# Patient Record
Sex: Female | Born: 1935 | Race: White | Hispanic: No | Smoking: Never smoker
Health system: Southern US, Community
[De-identification: ages and names within clinical notes are randomized; demographics above are authoritative.]

## PROBLEM LIST (undated history)

## (undated) DIAGNOSIS — R911 Solitary pulmonary nodule: Secondary | ICD-10-CM

## (undated) DIAGNOSIS — D375 Neoplasm of uncertain behavior of rectum: Secondary | ICD-10-CM

## (undated) DIAGNOSIS — K769 Liver disease, unspecified: Secondary | ICD-10-CM

## (undated) DIAGNOSIS — M549 Dorsalgia, unspecified: Secondary | ICD-10-CM

## (undated) DIAGNOSIS — E039 Hypothyroidism, unspecified: Secondary | ICD-10-CM

## (undated) DIAGNOSIS — K621 Rectal polyp: Secondary | ICD-10-CM

## (undated) DIAGNOSIS — N390 Urinary tract infection, site not specified: Secondary | ICD-10-CM

## (undated) DIAGNOSIS — I459 Conduction disorder, unspecified: Secondary | ICD-10-CM

## (undated) DIAGNOSIS — I10 Essential (primary) hypertension: Secondary | ICD-10-CM

## (undated) DIAGNOSIS — I351 Nonrheumatic aortic (valve) insufficiency: Secondary | ICD-10-CM

## (undated) DIAGNOSIS — I442 Atrioventricular block, complete: Secondary | ICD-10-CM

## (undated) DIAGNOSIS — I340 Nonrheumatic mitral (valve) insufficiency: Principal | ICD-10-CM

## (undated) DIAGNOSIS — K5732 Diverticulitis of large intestine without perforation or abscess without bleeding: Secondary | ICD-10-CM

## (undated) DIAGNOSIS — K5792 Diverticulitis of intestine, part unspecified, without perforation or abscess without bleeding: Secondary | ICD-10-CM

## (undated) HISTORY — DX: Liver disease, unspecified: K76.9

## (undated) HISTORY — PX: ABDOMINAL HYSTERECTOMY: SHX81

## (undated) HISTORY — DX: Diverticulitis of intestine, part unspecified, without perforation or abscess without bleeding: K57.92

## (undated) HISTORY — DX: Neoplasm of uncertain behavior of rectum: D37.5

## (undated) HISTORY — DX: Essential (primary) hypertension: I10

## (undated) HISTORY — DX: Rectal polyp: K62.1

## (undated) HISTORY — DX: Diverticulitis of large intestine without perforation or abscess without bleeding: K57.32

## (undated) HISTORY — DX: Atrioventricular block, complete: I44.2

## (undated) HISTORY — DX: Solitary pulmonary nodule: R91.1

## (undated) HISTORY — DX: Nonrheumatic mitral (valve) insufficiency: I34.0

## (undated) HISTORY — DX: Conduction disorder, unspecified: I45.9

## (undated) HISTORY — DX: Nonrheumatic aortic (valve) insufficiency: I35.1

---

## 1969-06-30 HISTORY — PX: INNER EAR SURGERY: SHX679

## 1998-11-05 ENCOUNTER — Other Ambulatory Visit: Admission: RE | Admit: 1998-11-05 | Discharge: 1998-11-05 | Payer: Self-pay | Admitting: Obstetrics and Gynecology

## 2000-01-09 ENCOUNTER — Other Ambulatory Visit: Admission: RE | Admit: 2000-01-09 | Discharge: 2000-01-09 | Payer: Self-pay | Admitting: Obstetrics and Gynecology

## 2000-01-20 ENCOUNTER — Encounter: Admission: RE | Admit: 2000-01-20 | Discharge: 2000-01-20 | Payer: Self-pay | Admitting: Obstetrics and Gynecology

## 2000-01-20 ENCOUNTER — Encounter: Payer: Self-pay | Admitting: Obstetrics and Gynecology

## 2000-12-19 ENCOUNTER — Other Ambulatory Visit: Admission: RE | Admit: 2000-12-19 | Discharge: 2000-12-19 | Payer: Self-pay | Admitting: Obstetrics and Gynecology

## 2001-01-21 ENCOUNTER — Encounter: Payer: Self-pay | Admitting: Obstetrics and Gynecology

## 2001-01-21 ENCOUNTER — Encounter: Admission: RE | Admit: 2001-01-21 | Discharge: 2001-01-21 | Payer: Self-pay | Admitting: Obstetrics and Gynecology

## 2001-10-08 ENCOUNTER — Other Ambulatory Visit: Admission: RE | Admit: 2001-10-08 | Discharge: 2001-10-08 | Payer: Self-pay | Admitting: Obstetrics and Gynecology

## 2002-01-03 ENCOUNTER — Encounter: Admission: RE | Admit: 2002-01-03 | Discharge: 2002-01-03 | Payer: Self-pay | Admitting: Gynecology

## 2002-01-03 ENCOUNTER — Encounter: Payer: Self-pay | Admitting: Gynecology

## 2002-12-29 ENCOUNTER — Encounter: Admission: RE | Admit: 2002-12-29 | Discharge: 2002-12-29 | Payer: Self-pay | Admitting: Gynecology

## 2002-12-29 ENCOUNTER — Encounter: Payer: Self-pay | Admitting: Gynecology

## 2003-09-30 ENCOUNTER — Other Ambulatory Visit: Admission: RE | Admit: 2003-09-30 | Discharge: 2003-09-30 | Payer: Self-pay | Admitting: Gynecology

## 2004-02-03 ENCOUNTER — Encounter: Admission: RE | Admit: 2004-02-03 | Discharge: 2004-02-03 | Payer: Self-pay | Admitting: Gynecology

## 2005-02-03 ENCOUNTER — Encounter: Admission: RE | Admit: 2005-02-03 | Discharge: 2005-02-03 | Payer: Self-pay | Admitting: Gynecology

## 2005-02-22 ENCOUNTER — Encounter: Admission: RE | Admit: 2005-02-22 | Discharge: 2005-02-22 | Payer: Self-pay | Admitting: Gynecology

## 2006-02-01 ENCOUNTER — Other Ambulatory Visit: Admission: RE | Admit: 2006-02-01 | Discharge: 2006-02-01 | Payer: Self-pay | Admitting: Gynecology

## 2006-02-05 ENCOUNTER — Encounter: Admission: RE | Admit: 2006-02-05 | Discharge: 2006-02-05 | Payer: Self-pay | Admitting: Gynecology

## 2007-02-08 ENCOUNTER — Encounter: Admission: RE | Admit: 2007-02-08 | Discharge: 2007-02-08 | Payer: Self-pay | Admitting: Gynecology

## 2008-02-18 ENCOUNTER — Encounter: Admission: RE | Admit: 2008-02-18 | Discharge: 2008-02-18 | Payer: Self-pay | Admitting: Gynecology

## 2009-02-18 ENCOUNTER — Encounter: Admission: RE | Admit: 2009-02-18 | Discharge: 2009-02-18 | Payer: Self-pay | Admitting: Gynecology

## 2010-04-11 ENCOUNTER — Encounter: Admission: RE | Admit: 2010-04-11 | Discharge: 2010-04-11 | Payer: Self-pay | Admitting: Gynecology

## 2010-06-28 ENCOUNTER — Encounter: Admission: RE | Admit: 2010-06-28 | Discharge: 2010-06-28 | Payer: Self-pay | Admitting: Gynecology

## 2010-11-20 ENCOUNTER — Encounter: Payer: Self-pay | Admitting: Gynecology

## 2011-04-07 ENCOUNTER — Other Ambulatory Visit: Payer: Self-pay | Admitting: Gynecology

## 2011-04-07 DIAGNOSIS — Z1231 Encounter for screening mammogram for malignant neoplasm of breast: Secondary | ICD-10-CM

## 2011-04-13 ENCOUNTER — Ambulatory Visit
Admission: RE | Admit: 2011-04-13 | Discharge: 2011-04-13 | Disposition: A | Discharge status: Home / Self Care | Payer: Medicare Other | Source: Ambulatory Visit | Attending: Gynecology | Admitting: Gynecology

## 2011-04-13 DIAGNOSIS — Z1231 Encounter for screening mammogram for malignant neoplasm of breast: Secondary | ICD-10-CM

## 2011-11-15 DIAGNOSIS — R7989 Other specified abnormal findings of blood chemistry: Secondary | ICD-10-CM | POA: Diagnosis not present

## 2012-01-09 DIAGNOSIS — L259 Unspecified contact dermatitis, unspecified cause: Secondary | ICD-10-CM | POA: Diagnosis not present

## 2012-03-21 ENCOUNTER — Other Ambulatory Visit: Payer: Self-pay | Admitting: Gynecology

## 2012-03-21 DIAGNOSIS — Z1231 Encounter for screening mammogram for malignant neoplasm of breast: Secondary | ICD-10-CM

## 2012-04-15 ENCOUNTER — Ambulatory Visit
Admission: RE | Admit: 2012-04-15 | Discharge: 2012-04-15 | Disposition: A | Discharge status: Home / Self Care | Payer: Medicare Other | Source: Ambulatory Visit | Attending: Gynecology | Admitting: Gynecology

## 2012-04-15 DIAGNOSIS — Z1231 Encounter for screening mammogram for malignant neoplasm of breast: Secondary | ICD-10-CM | POA: Diagnosis not present

## 2012-05-30 DIAGNOSIS — H04129 Dry eye syndrome of unspecified lacrimal gland: Secondary | ICD-10-CM | POA: Diagnosis not present

## 2012-05-30 DIAGNOSIS — D3130 Benign neoplasm of unspecified choroid: Secondary | ICD-10-CM | POA: Diagnosis not present

## 2012-05-30 DIAGNOSIS — Z961 Presence of intraocular lens: Secondary | ICD-10-CM | POA: Diagnosis not present

## 2012-07-22 DIAGNOSIS — E039 Hypothyroidism, unspecified: Secondary | ICD-10-CM | POA: Diagnosis not present

## 2012-08-13 DIAGNOSIS — N3090 Cystitis, unspecified without hematuria: Secondary | ICD-10-CM | POA: Diagnosis not present

## 2012-08-13 DIAGNOSIS — Z01419 Encounter for gynecological examination (general) (routine) without abnormal findings: Secondary | ICD-10-CM | POA: Diagnosis not present

## 2012-08-13 DIAGNOSIS — N8184 Pelvic muscle wasting: Secondary | ICD-10-CM | POA: Diagnosis not present

## 2012-08-21 DIAGNOSIS — N390 Urinary tract infection, site not specified: Secondary | ICD-10-CM | POA: Diagnosis not present

## 2012-12-06 DIAGNOSIS — N390 Urinary tract infection, site not specified: Secondary | ICD-10-CM | POA: Diagnosis not present

## 2013-01-15 ENCOUNTER — Inpatient Hospital Stay (HOSPITAL_COMMUNITY)
Admission: EM | Admit: 2013-01-15 | Discharge: 2013-01-17 | DRG: 375 | Disposition: A | Discharge status: Home / Self Care | Payer: Medicare Other | Attending: Family Medicine | Admitting: Family Medicine

## 2013-01-15 ENCOUNTER — Encounter (HOSPITAL_COMMUNITY): Payer: Self-pay | Admitting: Emergency Medicine

## 2013-01-15 DIAGNOSIS — K769 Liver disease, unspecified: Secondary | ICD-10-CM | POA: Diagnosis not present

## 2013-01-15 DIAGNOSIS — Z79899 Other long term (current) drug therapy: Secondary | ICD-10-CM | POA: Diagnosis not present

## 2013-01-15 DIAGNOSIS — D371 Neoplasm of uncertain behavior of stomach: Secondary | ICD-10-CM | POA: Diagnosis not present

## 2013-01-15 DIAGNOSIS — E039 Hypothyroidism, unspecified: Secondary | ICD-10-CM | POA: Diagnosis present

## 2013-01-15 DIAGNOSIS — K6389 Other specified diseases of intestine: Secondary | ICD-10-CM | POA: Diagnosis not present

## 2013-01-15 DIAGNOSIS — C189 Malignant neoplasm of colon, unspecified: Secondary | ICD-10-CM | POA: Diagnosis not present

## 2013-01-15 DIAGNOSIS — K625 Hemorrhage of anus and rectum: Secondary | ICD-10-CM | POA: Diagnosis present

## 2013-01-15 DIAGNOSIS — D375 Neoplasm of uncertain behavior of rectum: Secondary | ICD-10-CM | POA: Diagnosis present

## 2013-01-15 DIAGNOSIS — C787 Secondary malignant neoplasm of liver and intrahepatic bile duct: Secondary | ICD-10-CM | POA: Diagnosis not present

## 2013-01-15 DIAGNOSIS — K7689 Other specified diseases of liver: Secondary | ICD-10-CM | POA: Diagnosis not present

## 2013-01-15 DIAGNOSIS — K5730 Diverticulosis of large intestine without perforation or abscess without bleeding: Secondary | ICD-10-CM | POA: Diagnosis present

## 2013-01-15 DIAGNOSIS — C187 Malignant neoplasm of sigmoid colon: Principal | ICD-10-CM | POA: Diagnosis present

## 2013-01-15 DIAGNOSIS — R160 Hepatomegaly, not elsewhere classified: Secondary | ICD-10-CM | POA: Diagnosis present

## 2013-01-15 HISTORY — DX: Dorsalgia, unspecified: M54.9

## 2013-01-15 HISTORY — DX: Hypothyroidism, unspecified: E03.9

## 2013-01-15 HISTORY — DX: Urinary tract infection, site not specified: N39.0

## 2013-01-15 LAB — OCCULT BLOOD, POC DEVICE: Fecal Occult Bld: POSITIVE — AB

## 2013-01-15 LAB — CBC
MCV: 94.7 fL (ref 78.0–100.0)
Platelets: 426 10*3/uL — ABNORMAL HIGH (ref 150–400)
RDW: 14 % (ref 11.5–15.5)
WBC: 7 10*3/uL (ref 4.0–10.5)

## 2013-01-15 LAB — COMPREHENSIVE METABOLIC PANEL
AST: 21 U/L (ref 0–37)
Albumin: 3.6 g/dL (ref 3.5–5.2)
Calcium: 10.1 mg/dL (ref 8.4–10.5)
Chloride: 103 mEq/L (ref 96–112)
Creatinine, Ser: 0.85 mg/dL (ref 0.50–1.10)
Total Bilirubin: 0.3 mg/dL (ref 0.3–1.2)
Total Protein: 7.4 g/dL (ref 6.0–8.3)

## 2013-01-15 LAB — TYPE AND SCREEN: Antibody Screen: NEGATIVE

## 2013-01-15 LAB — ABO/RH: ABO/RH(D): A POS

## 2013-01-15 NOTE — ED Provider Notes (Signed)
History     CSN: [id number]  Arrival date & time 01/15/13  1907   First MD Initiated Contact with Patient 01/15/13 2308      Chief Complaint  Patient presents with  . Rectal Bleeding    (Consider location/radiation/quality/duration/timing/severity/associated sxs/prior treatment) HPI [name] is a 79 y.o. female presenting with rectal bleeding. She denies any abdominal pain, nausea vomiting, changes in stools says becoming a little bit looser today. She had one episode after breakfast, should one episode before and her today. She admits that she eaten some beets and tomatoes yesterday however she said there is definite blood in the stools. Symptoms of an intermittent, severe, and no other associated symptoms no other exacerbating or alleviating factors.  Patient takes no medications besides thyroid medication, has a relatively unremarkable medical history denies any alcohol, tobacco or illicit drug use. She is a remote history of having hemorrhoids but says she's not having any pain with defecation. No recent history of constipation since she found "a cereal" that keeps her regular.   The patient specifically denies any chest pain, shortness of breath, lightheadedness, dizziness, changes in vision, double vision, blurry vision, headache, rash, new arthralgias, myalgias.    Patient is a new patient of Dr. Christell Constant in [place].  Past Medical History  Diagnosis Date  . Back pain   . UTI (urinary tract infection)   . Hypothyroid     Past Surgical History  Procedure Laterality Date  . Abdominal hysterectomy      No family history on file.  History  Substance Use Topics  . Smoking status: Never Smoker   . Smokeless tobacco: Not on file  . Alcohol Use: No    OB History   Grav Para Term Preterm Abortions TAB SAB Ect Mult Living                  Review of Systems At least 10pt or greater review of systems completed and are negative except where specified in the  HPI.  Allergies  Review of patient's allergies indicates not on file.  Home Medications  No current outpatient prescriptions on file.  BP 142/68  Pulse 90  Temp(Src) 98.6 F (37 C) (Oral)  Resp 16  SpO2 98%  Physical Exam  Nursing notes reviewed.  Electronic medical record reviewed. VITAL SIGNS:   Filed Vitals:   01/15/13 1926 01/15/13 1938 01/15/13 1939  BP: 142/68 136/66 142/68  Pulse: 90 104 90  Temp: 98.6 F (37 C)    TempSrc: Oral    Resp: 16    SpO2: 98%     CONSTITUTIONAL: Awake, oriented, appears non-toxic HENT: Atraumatic, normocephalic, oral mucosa pink and moist, airway patent. Nares patent without drainage. External ears normal. EYES: Conjunctiva clear, EOMI, PERRLA NECK: Trachea midline, non-tender, supple CARDIOVASCULAR: Normal heart rate, Normal rhythm, No murmurs, rubs, gallops PULMONARY/CHEST: Clear to auscultation, no rhonchi, wheezes, or rales. Symmetrical breath sounds. Non-tender. ABDOMINAL: Non-distended, soft, non-tender - no rebound or guarding.  BS normal. RECTAL: normal rectal tone, grossly bloody stool NEUROLOGIC: Non-focal, moving all four extremities, no gross sensory or motor deficits. EXTREMITIES: No clubbing, cyanosis, or edema SKIN: Warm, Dry, No erythema, No rash. Excoriations to the middle of the lower back   ED Course  Procedures (including critical care time)  Labs Reviewed  CBC - Abnormal; Notable for the following:    Platelets 426 (*)    All other components within normal limits  COMPREHENSIVE METABOLIC PANEL - Abnormal; Notable for the following:  Glucose, Bld 107 (*)    GFR calc non Af Amer 62 (*)    GFR calc Af Amer 72 (*)    All other components within normal limits  TYPE AND SCREEN  ABO/RH   Ct Abdomen Pelvis W Contrast  01/16/2013  *RADIOLOGY REPORT*  Clinical Data: 2 episodes of bright red blood per rectum earlier today.  Surgical history includes hysterectomy.  CT ABDOMEN AND PELVIS WITH CONTRAST  Technique:   Multidetector CT imaging of the abdomen and pelvis was performed following the standard protocol during bolus administration of intravenous contrast.  Contrast: 80mL OMNIPAQUE IOHEXOL 300 MG/ML. Oral contrast was also administered.  Comparison: None.  Findings: Respiratory motion blurred images throughout the examination.  Sigmoid colon diverticulosis.  Enhancing mass in the distal sigmoid colon at the rectosigmoid junction which measures approximately 2.7 x 3.2 x 3.5 cm.  Edema/inflammation versus lymphatic extension involving the adjacent mesenteric fat.  No extraluminal gas.  No abscess.  Mildly enlarged lymph nodes in the adjacent mesentery.  Approximate 2.8 x 2.8 x 2.8 cm solid mass in the left lobe of the liver.  No other focal hepatic parenchymal abnormality.  Normal- appearing spleen, pancreas, and adrenal glands.  Cortical cysts involving both kidneys; no significant abnormalities involving either kidney.  Gallbladder unremarkable by CT.  No biliary ductal dilation.  Extensive aorto-iliofemoral atherosclerosis without aneurysm.  Atherosclerotic though patent visceral arteries.  No significant lymphadenopathy.  Small hiatal hernia.  Stomach otherwise unremarkable.  Normal- appearing small bowel.  Normal appendix in the right upper pelvis. No ascites.  Abdominal wall hernia containing fat in the left mid pelvis, involving the lateral aspect of the abdominal wall musculature.  Urinary bladder unremarkable.  Uterus surgically absent.  No adnexal masses or free pelvic fluid.  Numerous pelvic phleboliths.  Bone window images demonstrate degenerative changes involving the lower thoracic and lumbar spine, generalized osseous demineralization, and thoracolumbar scoliosis convex left. Scarring in the visualized lower lobes and a statistically benign pleural-based nodule in the deep left lower lobe measuring approximately 9 mm. Heart mildly enlarged.  IMPRESSION:  1.  Approximate 3.5 cm mass involving the distal  sigmoid colon near the rectosigmoid junction, consistent with malignancy.  There is associated edema/inflammation or local lymphatic extension in the adjacent mesenteric fat, with mildly enlarged local lymph nodes. No extraluminal gas or abscess to suggest perforation. 2.  No significant lymphadenopathy elsewhere in the abdomen or pelvis. 3.  Approximate 2.8 cm metastasis in the left lobe of the liver. No other liver metastases. 4.  Peripheral 9 mm nodule in the deep left lower lobe, statistically benign. 5.  Small hiatal hernia. 6.  Anterior abdominal wall hernia in the mid pelvis involving the lateral abdominal musculature (Spigelian hernia). 7.  Sigmoid colon diverticulosis.   Original Report Authenticated By: Hulan Saas, M.D.       1. Colonic mass   2. Mass of left lobe of liver    MDM  [name] is a 79 y.o. female With no pertinent medical history presents with bright blood PR.  Patient says she is relatively regular and has had no bowel habit changes, this point differential still remains broad, likely bleeding diverticula versus colonic CA, versus internal hemorrhoid that is not seen on physical exam.  Patient had labs obtained per triage including type and screen, CMP and CBC showing hemoglobin to be stable at 13.5 record 39.4, there is no prior CBC to compare with.  We'll obtain CT of the abdomen and pelvis looking  for masses or diverticulosis.  Patient has approximate 3.5 cm mass involving distal sigmoid colon in the rectosigmoid junction consistent with malignancy with possible metastasis to the liver at 2.8 cm in the left lobe of the liver.  I discussed these findings with the patient and suggested that she stay in hospital with her continued rectal bleeding for further workup. Initially patient states that she would like to go home, however in further discussions, she agrees to admission to the hospital for further diagnostic testing.       Jones Skene,  MD 01/16/13 [phone]

## 2013-01-15 NOTE — ED Notes (Signed)
C/o 2 episodes of bright red rectal bleeding since this morning.  Denies pain or any other symptoms.

## 2013-01-16 ENCOUNTER — Emergency Department (HOSPITAL_COMMUNITY): Payer: Medicare Other

## 2013-01-16 DIAGNOSIS — K6389 Other specified diseases of intestine: Secondary | ICD-10-CM

## 2013-01-16 DIAGNOSIS — K625 Hemorrhage of anus and rectum: Secondary | ICD-10-CM | POA: Diagnosis not present

## 2013-01-16 DIAGNOSIS — D375 Neoplasm of uncertain behavior of rectum: Secondary | ICD-10-CM | POA: Diagnosis present

## 2013-01-16 DIAGNOSIS — K769 Liver disease, unspecified: Secondary | ICD-10-CM

## 2013-01-16 DIAGNOSIS — C189 Malignant neoplasm of colon, unspecified: Secondary | ICD-10-CM | POA: Diagnosis not present

## 2013-01-16 DIAGNOSIS — C787 Secondary malignant neoplasm of liver and intrahepatic bile duct: Secondary | ICD-10-CM | POA: Diagnosis not present

## 2013-01-16 DIAGNOSIS — R160 Hepatomegaly, not elsewhere classified: Secondary | ICD-10-CM | POA: Diagnosis present

## 2013-01-16 HISTORY — DX: Neoplasm of uncertain behavior of rectum: D37.5

## 2013-01-16 LAB — PROTIME-INR: Prothrombin Time: 13.4 seconds (ref 11.6–15.2)

## 2013-01-16 LAB — BASIC METABOLIC PANEL
GFR calc Af Amer: 69 mL/min — ABNORMAL LOW (ref >90)
GFR calc non Af Amer: 60 mL/min — ABNORMAL LOW (ref >90)
Glucose, Bld: 104 mg/dL — ABNORMAL HIGH (ref 70–99)
Potassium: 4.1 mEq/L (ref 3.5–5.1)
Sodium: 139 mEq/L (ref 135–145)

## 2013-01-16 LAB — CBC
Hemoglobin: 12.1 g/dL (ref 12.0–15.0)
MCHC: 34.1 g/dL (ref 30.0–36.0)

## 2013-01-16 MED ORDER — PEG-KCL-NACL-NASULF-NA ASC-C 100 G PO SOLR
1.0000 | Freq: Once | ORAL | Status: AC
  Administered 2013-01-16: 100 g via ORAL
  Filled 2013-01-16: qty 1

## 2013-01-16 MED ORDER — PEG 3350-KCL-NA BICARB-NACL 420 G PO SOLR
4000.0000 mL | Freq: Once | ORAL | Status: DC
  Filled 2013-01-16: qty 4000

## 2013-01-16 MED ORDER — LEVOTHYROXINE SODIUM 112 MCG PO TABS
112.0000 ug | ORAL_TABLET | Freq: Every day | ORAL | Status: DC
  Administered 2013-01-16 – 2013-01-17 (×2): 112 ug via ORAL
  Filled 2013-01-16 (×3): qty 1

## 2013-01-16 MED ORDER — PEG-KCL-NACL-NASULF-NA ASC-C 100 G PO SOLR: 1.0000 | Freq: Once | ORAL | Status: DC

## 2013-01-16 MED ORDER — IOHEXOL 300 MG/ML  SOLN
80.0000 mL | Freq: Once | INTRAMUSCULAR | Status: AC | PRN
  Administered 2013-01-16: 80 mL via INTRAVENOUS

## 2013-01-16 MED ORDER — ADULT MULTIVITAMIN W/MINERALS CH
1.0000 | ORAL_TABLET | Freq: Every day | ORAL | Status: DC
  Administered 2013-01-16 – 2013-01-17 (×2): 1 via ORAL
  Filled 2013-01-16 (×2): qty 1

## 2013-01-16 MED ORDER — IOHEXOL 300 MG/ML  SOLN
50.0000 mL | Freq: Once | INTRAMUSCULAR | Status: AC | PRN
  Administered 2013-01-16: 50 mL via ORAL

## 2013-01-16 NOTE — Consult Note (Signed)
Patient seen, examined, and I agree with the above documentation, including the assessment and plan. Colonoscopy tomorrow for what is felt to be rectosigmoid colon cancer with new liver mass concerning for metastatic focus Further recommendations after colonoscopy

## 2013-01-16 NOTE — Progress Notes (Signed)
9:34 AM I agree with HPI/GPe and A/P per Dr. Julian Reil   Patient unhappy about needing to wait for colonoscopy. No new issues-has a small dark stool earlier today Tol liquids.  No n/v/cp     HEENT CHEST CARDIAC ABDOMEN NEURO SKIN/MUSCULAR  Patient Active Problem List  Diagnosis  . Colonic mass  . Mass of left lobe of liver   A/P Called GI to assess patient and determine needs for colonoscopy. I have already started her on Golytely in anticipation of this  Pleas Koch, MD [facility] [phone]

## 2013-01-16 NOTE — Progress Notes (Signed)
NURSING PROGRESS NOTE  [name] [id number] Admission Data: 01/16/2013 5:11 AM Attending Provider: Hillary Bow, DO [id number], DONALD, MD Code Status: full  [name] is a 79 y.o. female patient admitted from ED:  -No acute distress noted.  -No complaints of shortness of breath.  -No complaints of chest pain.   Cardiac Monitoring: Not ordered  Blood pressure 130/71, pulse 78, temperature 97.7 F (36.5 C), temperature source Oral, resp. rate 18, SpO2 96.00%.   IV Fluids:  IV in place, occlusive dsg intact without redness, IV cath forearm RIGHT {Meds; iv fluids:NSL  Allergies:  Review of patient's allergies indicates no known allergies.  Past Medical History:   has a past medical history of Back pain; UTI (urinary tract infection); and Hypothyroid.  Past Surgical History:   has past surgical history that includes Abdominal hysterectomy.  Social History:   reports that she has never smoked. She does not have any smokeless tobacco history on file. She reports that she does not drink alcohol or use illicit drugs.  Skin: no skin issues noted  Patient/Family orientated to room. Information packet given to patient/family. Admission inpatient armband information verified with patient/family to include name and date of birth and placed on patient arm. Side rails up x 2, fall assessment and education completed with patient/family. Patient/family able to verbalize understanding of risk associated with falls and verbalized understanding to call for assistance before getting out of bed. Call light within reach. Patient/family able to voice and demonstrate understanding of unit orientation instructions.   Pt to possibly go for colonoscopy to r/o ca on tomorrow.  Will continue to evaluate and treat per MD orders.

## 2013-01-16 NOTE — Consult Note (Signed)
Referring Provider: Dr. Malka So hospitalist Primary Care Physician:  Rudi Heap, MD Primary Gastroenterologist:  none  Reason for Consultation:  Rectal bleeding and colon mass on CT  HPI: [name] is a 79 y.o. female admitted last evening after 2 episodes of rectal bleeding at home yesterday. Patient states that she has never had any GI issues and has not had any prior colonoscopy. She says she had a bowel movement yesterday morning and noticed right red blood in the commode and mixed in with the bowel movement. She had a second episode later yesterday afternoon which she says was less blood and came onto the emergency room. She says she's been feeling well recently and has no complaints of any abdominal discomfort cramping changes in her bowel habits or melena or hematochezia  She is in good general health with history of hypothyroidism. She is not anticoagulated. She has not had any further episodes of bleeding since admission, and is anxious to proceed with further workup.  Labs on admission showed hemoglobin in the 12 range. CT of the abdomen and pelvis were done on admission with findings as outlined below-is consistent with a sigmoid colon cancer and a large solitary metastasis to the left lobe of the liver.  Past Medical History  Diagnosis Date  . Back pain   . UTI (urinary tract infection)   . Hypothyroid     Past Surgical History  Procedure Laterality Date  . Abdominal hysterectomy      Prior to Admission medications   Medication Sig Start Date End Date Taking? Authorizing Provider  levothyroxine (SYNTHROID, LEVOTHROID) 112 MCG tablet Take 112 mcg by mouth daily.   Yes Historical Provider, MD  Multiple Vitamin (MULTIVITAMIN WITH MINERALS) TABS Take 1 tablet by mouth daily.   Yes Historical Provider, MD  OVER THE COUNTER MEDICATION Take 1 tablet by mouth daily. OTC medication for bone strength   Yes Historical Provider, MD    Current Facility-Administered  Medications  Medication Dose Route Frequency Provider Last Rate Last Dose  . levothyroxine (SYNTHROID, LEVOTHROID) tablet 112 mcg  112 mcg Oral QAC breakfast Hillary Bow, DO   112 mcg at 01/16/13 0840  . multivitamin with minerals tablet 1 tablet  1 tablet Oral Daily Hillary Bow, DO   1 tablet at 01/16/13 1006  . polyethylene glycol-electrolytes (NuLYTELY/GoLYTELY) solution 4,000 mL  4,000 mL Oral Once Rhetta Mura, MD        Allergies as of 01/15/2013  . (No Known Allergies)    No family history on file.  History   Social History  . Marital Status: Married    Spouse Name: N/A    Number of Children: N/A  . Years of Education: N/A   Occupational History  . Not on file.   Social History Main Topics  . Smoking status: Never Smoker   . Smokeless tobacco: Not on file  . Alcohol Use: No  . Drug Use: No  . Sexually Active: Not on file   Other Topics Concern  . Not on file   Social History Narrative  . No narrative on file    Review of Systems: Pertinent positive and negative review of systems were noted in the above HPI section.  All other review of systems was otherwise negative.h DM, thyroid, adrenal function.  Physical Exam: Vital signs in last 24 hours: Temp:  [97.7 F (36.5 C)-98.6 F (37 C)] 97.7 F (36.5 C) (03/20 0440) Pulse Rate:  [78-104] 78 (03/20 0440) Resp:  [16-20]  18 (03/20 0440) BP: (130-165)/(66-86) 130/71 mmHg (03/20 0440) SpO2:  [96 %-98 %] 96 % (03/20 0440) Weight:  [143 lb 15.4 oz (65.3 kg)] 143 lb 15.4 oz (65.3 kg) (03/20 0500) Last BM Date: 01/15/13 General:   Alert,  Well-developed, well-nourished, elderly white female ,cooperative in NAD, thin Head:  Normocephalic and atraumatic. Eyes:  Sclera clear, no icterus.   Conjunctiva pink. Ears:  Normal auditory acuity. Nose:  No deformity, discharge,  or lesions. Mouth:  No deformity or lesions.   Neck:  Supple; no masses or thyromegaly. Lungs:  Clear throughout to auscultation.    No wheezes, crackles, or rhonchi. Heart:  Regular rate and rhythm; no murmurs, clicks, rubs,  or gallops. Abdomen:  Soft,nontender, BS active,nonpalp mass or hsm.   Rectal:  Deferred  Msk:  Symmetrical without gross deformities. . Pulses:  Normal pulses noted. Extremities:  Without clubbing or edema. Neurologic:  Alert and  oriented x4;  grossly normal neurologically. Skin:  Intact without significant lesions or rashes.. Psych:  Alert and cooperative. Normal mood and affect.  Intake/Output from previous day:   Intake/Output this shift: Total I/O In: 240 [P.O.:240] Out: -   Lab Results:  Recent Labs  01/15/13 1936 01/16/13 0615  WBC 7.0 5.5  HGB 13.5 12.1  HCT 39.5 35.5*  PLT 426* 385   BMET  Recent Labs  01/15/13 1936 01/16/13 0615  NA 142 139  K 3.9 4.1  CL 103 103  CO2 26 28  GLUCOSE 107* 104*  BUN 11 10  CREATININE 0.85 0.88  CALCIUM 10.1 9.5   LFT  Recent Labs  01/15/13 1936  PROT 7.4  ALBUMIN 3.6  AST 21  ALT 19  ALKPHOS 77  BILITOT 0.3   PT/INR  Recent Labs  01/16/13 0221  LABPROT 13.4  INR 1.03   Hepatitis Panel No results found for this basename: HEPBSAG, HCVAB, HEPAIGM, HEPBIGM,  in the last 72 hours    Studies/Results: Ct Abdomen Pelvis W Contrast  01/16/2013  *RADIOLOGY REPORT*  Clinical Data: 2 episodes of bright red blood per rectum earlier today.  Surgical history includes hysterectomy.  CT ABDOMEN AND PELVIS WITH CONTRAST  Technique:  Multidetector CT imaging of the abdomen and pelvis was performed following the standard protocol during bolus administration of intravenous contrast.  Contrast: 80mL OMNIPAQUE IOHEXOL 300 MG/ML. Oral contrast was also administered.  Comparison: None.  Findings: Respiratory motion blurred images throughout the examination.  Sigmoid colon diverticulosis.  Enhancing mass in the distal sigmoid colon at the rectosigmoid junction which measures approximately 2.7 x 3.2 x 3.5 cm.  Edema/inflammation versus  lymphatic extension involving the adjacent mesenteric fat.  No extraluminal gas.  No abscess.  Mildly enlarged lymph nodes in the adjacent mesentery.  Approximate 2.8 x 2.8 x 2.8 cm solid mass in the left lobe of the liver.  No other focal hepatic parenchymal abnormality.  Normal- appearing spleen, pancreas, and adrenal glands.  Cortical cysts involving both kidneys; no significant abnormalities involving either kidney.  Gallbladder unremarkable by CT.  No biliary ductal dilation.  Extensive aorto-iliofemoral atherosclerosis without aneurysm.  Atherosclerotic though patent visceral arteries.  No significant lymphadenopathy.  Small hiatal hernia.  Stomach otherwise unremarkable.  Normal- appearing small bowel.  Normal appendix in the right upper pelvis. No ascites.  Abdominal wall hernia containing fat in the left mid pelvis, involving the lateral aspect of the abdominal wall musculature.  Urinary bladder unremarkable.  Uterus surgically absent.  No adnexal masses or free pelvic fluid.  Numerous pelvic phleboliths.  Bone window images demonstrate degenerative changes involving the lower thoracic and lumbar spine, generalized osseous demineralization, and thoracolumbar scoliosis convex left. Scarring in the visualized lower lobes and a statistically benign pleural-based nodule in the deep left lower lobe measuring approximately 9 mm. Heart mildly enlarged.  IMPRESSION:  1.  Approximate 3.5 cm mass involving the distal sigmoid colon near the rectosigmoid junction, consistent with malignancy.  There is associated edema/inflammation or local lymphatic extension in the adjacent mesenteric fat, with mildly enlarged local lymph nodes. No extraluminal gas or abscess to suggest perforation. 2.  No significant lymphadenopathy elsewhere in the abdomen or pelvis. 3.  Approximate 2.8 cm metastasis in the left lobe of the liver. No other liver metastases. 4.  Peripheral 9 mm nodule in the deep left lower lobe, statistically  benign. 5.  Small hiatal hernia. 6.  Anterior abdominal wall hernia in the mid pelvis involving the lateral abdominal musculature (Spigelian hernia). 7.  Sigmoid colon diverticulosis.   Original Report Authenticated By: Hulan Saas, M.D.     IMPRESSION:  #32  79 year old white female with new onset of rectal bleeding and finding of a sigmoid colon mass on CT scan consistent with a colon cancer. As edema extending into the mesenteric fat and mildly enlarged lymph nodes as well as a probable solitary hepatic metastasis in the left lobe of the liver measuring 2.8 x 2.8 cm #2 hypothyroidism  PLAN: #1 findings discussed with the patient and her son. We will schedule for colonoscopy with Dr. Rhea Belton tomorrow 01/17/2013. She will stay on a clear liquid diet today, and complete a  bowel prep #2 serial hemoglobins #3 CEA #4 she will need a surgical consultation this admission and this was discussed as well.   Ardean Melroy  01/16/2013, 12:14 PM

## 2013-01-16 NOTE — Progress Notes (Signed)
INITIAL NUTRITION ASSESSMENT  DOCUMENTATION CODES Per approved criteria  -Not Applicable   INTERVENTION: 1. RD will continue to follow. Pt expects good appetite once diet is advanced. RD will monitor and if po intake is inadequate will discuss nutrition supplements  NUTRITION DIAGNOSIS: Increased nutrition needs related to colon cancer as evidenced by weight loss with normal intake.   Goal: PO intake to meet >/=90% estimated nutrition needs  Monitor:  PO intake, weight trends, plan of cares.   Reason for Assessment: Malnutrition Screening Tool  79 y.o. female  Admitting Dx: Colonic mass  ASSESSMENT: Pt came to ED for BRBPR. Work up reveled colon mass likely malignant, and mass on liver, likely mets.  RD pulled to pt for reported unintentional weight loss and poor appetite PTA.    Pt states that she was eating her usual amount PTA, no specific details. Just that she eats when she wants, and what she wants. Has lost about 10 lbs, unsure of the time frame. Usually weighs about 155 lbs, now 143 lbs, 12 lb weight loss.  Expect that pt has increased energy needs related to colon mass.  Height: Ht Readings from Last 1 Encounters:  01/16/13 5\' 5"  (1.651 m)    Weight: Wt Readings from Last 1 Encounters:  01/16/13 143 lb 15.4 oz (65.3 kg)    Ideal Body Weight: 125 lbs   % Ideal Body Weight: 114%  Wt Readings from Last 10 Encounters:  01/16/13 143 lb 15.4 oz (65.3 kg)    Usual Body Weight: 155 lbs   % Usual Body Weight: 92%  BMI:  Body mass index is 23.96 kg/(m^2). WNL   Estimated Nutritional Needs: Kcal: 1800-2000 Protein: 70-80 gm  Fluid: 1.8-2 L   Skin: intact   Diet Order: Clear Liquid  EDUCATION NEEDS: -No education needs identified at this time  No intake or output data in the 24 hours ending 01/16/13 1008  Last BM: PTA    Labs:   Recent Labs Lab 01/15/13 1936 01/16/13 0615  NA 142 139  K 3.9 4.1  CL 103 103  CO2 26 28  BUN 11 10   CREATININE 0.85 0.88  CALCIUM 10.1 9.5  GLUCOSE 107* 104*    CBG (last 3)  No results found for this basename: GLUCAP,  in the last 72 hours  Scheduled Meds: . levothyroxine  112 mcg Oral QAC breakfast  . multivitamin with minerals  1 tablet Oral Daily    Continuous Infusions:   Past Medical History  Diagnosis Date  . Back pain   . UTI (urinary tract infection)   . Hypothyroid     Past Surgical History  Procedure Laterality Date  . Abdominal hysterectomy      Clarene Duke RD, LDN Pager [phone] After Hours pager [phone]

## 2013-01-16 NOTE — H&P (Signed)
[facility] History and Physical  NYISHA CLIPPARD BJY:[id number] DOB: [birth year] DOA: 01/15/2013  Referring physician: ED PCP: Rudi Heap, MD  Specialists: None  Chief Complaint: BRBPR  HPI: [name] is a 79 y.o. female who presents with c/o BRBPR onset am of 3/19 during a BM.  No associated abd pain, nor n/v.  2 episodes.  Definite blood in the stool she says although not a large amount.  Does have a history of hemorrhoids but no pain with defecation.  In the ED work up revealed a 3.5cm colon mass, likely malignant, and a 2.8cm liver mass likely representing metastatic disease (no other mets identified on CT).  Patient is being admitted for workup, GI consultation, and likely diagnostic colonoscopy and biopsy.  Review of Systems: 12 systems reviewed and otherwise negative.  Past Medical History  Diagnosis Date  . Back pain   . UTI (urinary tract infection)   . Hypothyroid    Past Surgical History  Procedure Laterality Date  . Abdominal hysterectomy     Social History:  reports that she has never smoked. She does not have any smokeless tobacco history on file. She reports that she does not drink alcohol or use illicit drugs.   No Known Allergies  No family history on file. Not contributory to present illness.  Prior to Admission medications   Medication Sig Start Date End Date Taking? Authorizing Provider  levothyroxine (SYNTHROID, LEVOTHROID) 112 MCG tablet Take 112 mcg by mouth daily.   Yes Historical Provider, MD  Multiple Vitamin (MULTIVITAMIN WITH MINERALS) TABS Take 1 tablet by mouth daily.   Yes Historical Provider, MD  OVER THE COUNTER MEDICATION Take 1 tablet by mouth daily. OTC medication for bone strength   Yes Historical Provider, MD   Physical Exam: Filed Vitals:   01/15/13 1926 01/15/13 1938 01/15/13 1939 01/16/13 0248  BP: 142/68 136/66 142/68 165/86  Pulse: 90 104 90 95  Temp: 98.6 F (37 C)   98 F (36.7 C)  TempSrc: Oral     Resp: 16    20  SpO2: 98%   98%    General:  NAD, resting comfortably in bed Eyes: PEERLA EOMI ENT: mucous membranes moist Neck: supple w/o JVD Cardiovascular: RRR w/o MRG Respiratory: CTA B Abdomen: soft, nt, nd, bs+ Skin: no rash nor lesion Musculoskeletal: MAE, full ROM all 4 extremities Psychiatric: normal tone and affect Neurologic: AAOx3, grossly non-focal  Labs on Admission:  Basic Metabolic Panel:  Recent Labs Lab 01/15/13 1936  NA 142  K 3.9  CL 103  CO2 26  GLUCOSE 107*  BUN 11  CREATININE 0.85  CALCIUM 10.1   Liver Function Tests:  Recent Labs Lab 01/15/13 1936  AST 21  ALT 19  ALKPHOS 77  BILITOT 0.3  PROT 7.4  ALBUMIN 3.6   No results found for this basename: LIPASE, AMYLASE,  in the last 168 hours No results found for this basename: AMMONIA,  in the last 168 hours CBC:  Recent Labs Lab 01/15/13 1936  WBC 7.0  HGB 13.5  HCT 39.5  MCV 94.7  PLT 426*   Cardiac Enzymes: No results found for this basename: CKTOTAL, CKMB, CKMBINDEX, TROPONINI,  in the last 168 hours  BNP (last 3 results) No results found for this basename: PROBNP,  in the last 8760 hours CBG: No results found for this basename: GLUCAP,  in the last 168 hours  Radiological Exams on Admission: Ct Abdomen Pelvis W Contrast  01/16/2013  *RADIOLOGY REPORT*  Clinical Data: 2 episodes of bright red blood per rectum earlier today.  Surgical history includes hysterectomy.  CT ABDOMEN AND PELVIS WITH CONTRAST  Technique:  Multidetector CT imaging of the abdomen and pelvis was performed following the standard protocol during bolus administration of intravenous contrast.  Contrast: 80mL OMNIPAQUE IOHEXOL 300 MG/ML. Oral contrast was also administered.  Comparison: None.  Findings: Respiratory motion blurred images throughout the examination.  Sigmoid colon diverticulosis.  Enhancing mass in the distal sigmoid colon at the rectosigmoid junction which measures approximately 2.7 x 3.2 x 3.5 cm.   Edema/inflammation versus lymphatic extension involving the adjacent mesenteric fat.  No extraluminal gas.  No abscess.  Mildly enlarged lymph nodes in the adjacent mesentery.  Approximate 2.8 x 2.8 x 2.8 cm solid mass in the left lobe of the liver.  No other focal hepatic parenchymal abnormality.  Normal- appearing spleen, pancreas, and adrenal glands.  Cortical cysts involving both kidneys; no significant abnormalities involving either kidney.  Gallbladder unremarkable by CT.  No biliary ductal dilation.  Extensive aorto-iliofemoral atherosclerosis without aneurysm.  Atherosclerotic though patent visceral arteries.  No significant lymphadenopathy.  Small hiatal hernia.  Stomach otherwise unremarkable.  Normal- appearing small bowel.  Normal appendix in the right upper pelvis. No ascites.  Abdominal wall hernia containing fat in the left mid pelvis, involving the lateral aspect of the abdominal wall musculature.  Urinary bladder unremarkable.  Uterus surgically absent.  No adnexal masses or free pelvic fluid.  Numerous pelvic phleboliths.  Bone window images demonstrate degenerative changes involving the lower thoracic and lumbar spine, generalized osseous demineralization, and thoracolumbar scoliosis convex left. Scarring in the visualized lower lobes and a statistically benign pleural-based nodule in the deep left lower lobe measuring approximately 9 mm. Heart mildly enlarged.  IMPRESSION:  1.  Approximate 3.5 cm mass involving the distal sigmoid colon near the rectosigmoid junction, consistent with malignancy.  There is associated edema/inflammation or local lymphatic extension in the adjacent mesenteric fat, with mildly enlarged local lymph nodes. No extraluminal gas or abscess to suggest perforation. 2.  No significant lymphadenopathy elsewhere in the abdomen or pelvis. 3.  Approximate 2.8 cm metastasis in the left lobe of the liver. No other liver metastases. 4.  Peripheral 9 mm nodule in the deep left lower  lobe, statistically benign. 5.  Small hiatal hernia. 6.  Anterior abdominal wall hernia in the mid pelvis involving the lateral abdominal musculature (Spigelian hernia). 7.  Sigmoid colon diverticulosis.   Original Report Authenticated By: Hulan Saas, M.D.     EKG: Independently reviewed.  Assessment/Plan Principal Problem:   Colonic mass Active Problems:   Mass of left lobe of liver   1. Colon mass and mass of left lobe of liver - probably representing colon cancer with metastasis to the liver, only the single liver met has been identified at this point and patient certainly does not have a large number of other chronic medical conditions so offering aggressive therapy as an option to the patient is quite reasonable (although patient is still undecided about this).  Admitting patient to hospital for GI consult, likely needs diagnostic colonoscopy with biopsy.  If indeed our suspicions are confirmed then will need oncology consult and likely need surgical consult (if patient willing) for resection of primary tumor and possibly ablation or other management of the metastatic tumor. 2. Hypothyroidism - continue synthroid    Code Status: Full Code (must indicate code status--if unknown or must be presumed, indicate so) Family Communication: Spoke with family  at bedside (indicate person spoken with, if applicable, with phone number if by telephone) Disposition Plan: Admit to inpatient (indicate anticipated LOS)  Time spent: 70 min  Sohaib Vereen M. [facility] Pager [phone]  If 7PM-7AM, please contact night-coverage [email or web address] Password [facility] 01/16/2013, 3:59 AM

## 2013-01-17 ENCOUNTER — Encounter (HOSPITAL_COMMUNITY): Payer: Self-pay

## 2013-01-17 ENCOUNTER — Encounter (HOSPITAL_COMMUNITY)
Admission: EM | Disposition: A | Discharge status: Home / Self Care | Payer: Self-pay | Source: Home / Self Care | Attending: Family Medicine

## 2013-01-17 DIAGNOSIS — K625 Hemorrhage of anus and rectum: Secondary | ICD-10-CM | POA: Diagnosis not present

## 2013-01-17 DIAGNOSIS — K769 Liver disease, unspecified: Secondary | ICD-10-CM | POA: Diagnosis not present

## 2013-01-17 DIAGNOSIS — K6389 Other specified diseases of intestine: Secondary | ICD-10-CM | POA: Diagnosis not present

## 2013-01-17 DIAGNOSIS — D371 Neoplasm of uncertain behavior of stomach: Secondary | ICD-10-CM | POA: Diagnosis not present

## 2013-01-17 HISTORY — PX: FLEXIBLE SIGMOIDOSCOPY: SHX5431

## 2013-01-17 LAB — CBC
Hemoglobin: 13.1 g/dL (ref 12.0–15.0)
MCH: 31.7 pg (ref 26.0–34.0)
RBC: 4.13 MIL/uL (ref 3.87–5.11)
WBC: 8.3 10*3/uL (ref 4.0–10.5)

## 2013-01-17 SURGERY — SIGMOIDOSCOPY, FLEXIBLE
Anesthesia: Moderate Sedation

## 2013-01-17 MED ORDER — MIDAZOLAM HCL 5 MG/ML IJ SOLN
INTRAMUSCULAR | Status: AC
  Filled 2013-01-17: qty 2

## 2013-01-17 MED ORDER — MIDAZOLAM HCL 5 MG/5ML IJ SOLN
INTRAMUSCULAR | Status: DC | PRN
  Administered 2013-01-17 (×2): 2 mg via INTRAVENOUS
  Administered 2013-01-17: 1 mg via INTRAVENOUS

## 2013-01-17 MED ORDER — SODIUM CHLORIDE 0.9 % IV SOLN
INTRAVENOUS | Status: DC
  Administered 2013-01-17: 500 mL via INTRAVENOUS

## 2013-01-17 MED ORDER — FENTANYL CITRATE 0.05 MG/ML IJ SOLN
INTRAMUSCULAR | Status: AC
  Filled 2013-01-17: qty 4

## 2013-01-17 MED ORDER — FENTANYL CITRATE 0.05 MG/ML IJ SOLN
INTRAMUSCULAR | Status: DC | PRN
  Administered 2013-01-17 (×2): 25 ug via INTRAVENOUS

## 2013-01-17 MED ORDER — IRON 325 (65 FE) MG PO TABS: 2.0000 | ORAL_TABLET | Freq: Every day | ORAL | Status: DC

## 2013-01-17 NOTE — Discharge Summary (Addendum)
Physician Discharge Summary  [name] NFA:[id number] DOB: June 08, 1928 DOA: 01/15/2013  PCP: Rudi Heap, MD  Admit date: 01/15/2013 Discharge date: 01/17/2013  Time spent: 25 minutes  Recommendations for Outpatient Follow-up:  1. Follow up with Dr. Rhea Belton for further consideration of therapies once Biospy results return  2. Get cbc in about 1 week at PCP office   Discharge Diagnoses:  Principal Problem:   Colonic mass Active Problems:   Mass of left lobe of liver   Discharge Condition: fair   Diet recommendation: reg  Filed Weights   01/16/13 0500  Weight: 65.3 kg (143 lb 15.4 oz)    History of present illness:  79 year old Caucasian female admitted 01/16/2013 with bright red blood per. Has not had this before. CT scan revealed a 3.5 cm colon mass and 2.8 cm liver mass. Gastroenterology was consulted and patient was prepared for colonoscopy and then subsequently underwent sigmoidoscopy on 01/17/2013. The initial findings on image review were suspicious for large polyp/mass with confirmation of this probably being cancer. Last 3 results are pending and as patient is hemodynamically stable tolerating by mouth well and has no acute issues, patient has been discharged back to her primary care physician and will followup with Dr. Rhea Belton for further workup inclusive of potentially adjuvant chemotherapy and then resection  Procedures: Flex sigmoidoscopy 3.21.2014-Half-circumferential mass, measuring 25 X 25mm in size, were found in the sigmoid colon; multiple biopsies of the lesion were performed 2. Near circumferential mass were found in the rectum; multiple biopsies of the lesion were performed 3. Moderate diverticulosis was noted in the descending colon andsigmoid colon   Consultations:  GI-Dr. Margretta Sidle  Discharge Exam: Filed Vitals:   01/17/13 1500 01/17/13 1505 01/17/13 1510 01/17/13 1515  BP: 141/56 151/70 160/73 155/65  Pulse:      Temp:      TempSrc:      Resp:  18 13 33 50  Height:      Weight:      SpO2: 99% 99% 100% 100%   Patient seen on Endo suite No further stools no bleeding no nausea no vomiting no chest  General: Alert pleasant oriented Cardiovascular: Some S2 no murmur rub or gallop Respiratory: Clinically clear Soft nontender no rebound  Discharge Instructions  Follow-up Information   Follow up with Rudi Heap, MD.   Contact information:   [address] [place] [address] [phone]       Follow up with PYRTLE, Carie Caddy, MD. (their office will call u)    Contact information:   [address] [place] [address] [phone]        Discharge Orders   Future Orders Complete By Expires     Call MD for:  extreme fatigue  As directed     Call MD for:  hives  As directed     Call MD for:  persistant nausea and vomiting  As directed     Call MD for:  redness, tenderness, or signs of infection (pain, swelling, redness, odor or green/yellow discharge around incision site)  As directed     Diet - low sodium heart healthy  As directed     Increase activity slowly  As directed         Medication List    STOP taking these medications       OVER THE COUNTER MEDICATION      TAKE these medications       Iron 325 (65 FE) MG Tabs  Take 2 tablets by mouth daily.     levothyroxine 112 MCG tablet  Commonly known as:  SYNTHROID, LEVOTHROID  Take 112 mcg by mouth daily.     multivitamin with minerals Tabs  Take 1 tablet by mouth daily.          The results of significant diagnostics from this hospitalization (including imaging, microbiology, ancillary and laboratory) are listed below for reference.    Significant Diagnostic Studies: Ct Abdomen Pelvis W Contrast  01/16/2013  *RADIOLOGY REPORT*  Clinical Data: 2 episodes of bright red blood per rectum earlier today.  Surgical history includes hysterectomy.  CT ABDOMEN AND PELVIS WITH CONTRAST  Technique:  Multidetector CT imaging of the abdomen and pelvis  was performed following the standard protocol during bolus administration of intravenous contrast.  Contrast: 80mL OMNIPAQUE IOHEXOL 300 MG/ML. Oral contrast was also administered.  Comparison: None.  Findings: Respiratory motion blurred images throughout the examination.  Sigmoid colon diverticulosis.  Enhancing mass in the distal sigmoid colon at the rectosigmoid junction which measures approximately 2.7 x 3.2 x 3.5 cm.  Edema/inflammation versus lymphatic extension involving the adjacent mesenteric fat.  No extraluminal gas.  No abscess.  Mildly enlarged lymph nodes in the adjacent mesentery.  Approximate 2.8 x 2.8 x 2.8 cm solid mass in the left lobe of the liver.  No other focal hepatic parenchymal abnormality.  Normal- appearing spleen, pancreas, and adrenal glands.  Cortical cysts involving both kidneys; no significant abnormalities involving either kidney.  Gallbladder unremarkable by CT.  No biliary ductal dilation.  Extensive aorto-iliofemoral atherosclerosis without aneurysm.  Atherosclerotic though patent visceral arteries.  No significant lymphadenopathy.  Small hiatal hernia.  Stomach otherwise unremarkable.  Normal- appearing small bowel.  Normal appendix in the right upper pelvis. No ascites.  Abdominal wall hernia containing fat in the left mid pelvis, involving the lateral aspect of the abdominal wall musculature.  Urinary bladder unremarkable.  Uterus surgically absent.  No adnexal masses or free pelvic fluid.  Numerous pelvic phleboliths.  Bone window images demonstrate degenerative changes involving the lower thoracic and lumbar spine, generalized osseous demineralization, and thoracolumbar scoliosis convex left. Scarring in the visualized lower lobes and a statistically benign pleural-based nodule in the deep left lower lobe measuring approximately 9 mm. Heart mildly enlarged.  IMPRESSION:  1.  Approximate 3.5 cm mass involving the distal sigmoid colon near the rectosigmoid junction, consistent  with malignancy.  There is associated edema/inflammation or local lymphatic extension in the adjacent mesenteric fat, with mildly enlarged local lymph nodes. No extraluminal gas or abscess to suggest perforation. 2.  No significant lymphadenopathy elsewhere in the abdomen or pelvis. 3.  Approximate 2.8 cm metastasis in the left lobe of the liver. No other liver metastases. 4.  Peripheral 9 mm nodule in the deep left lower lobe, statistically benign. 5.  Small hiatal hernia. 6.  Anterior abdominal wall hernia in the mid pelvis involving the lateral abdominal musculature (Spigelian hernia). 7.  Sigmoid colon diverticulosis.   Original Report Authenticated By: Hulan Saas, M.D.     Microbiology: No results found for this or any previous visit (from the past 240 hour(s)).   Labs: Basic Metabolic Panel:  Recent Labs Lab 01/15/13 1936 01/16/13 0615  NA 142 139  K 3.9 4.1  CL 103 103  CO2 26 28  GLUCOSE 107* 104*  BUN 11 10  CREATININE 0.85 0.88  CALCIUM 10.1 9.5   Liver Function Tests:  Recent Labs Lab 01/15/13 1936  AST 21  ALT  19  ALKPHOS 77  BILITOT 0.3  PROT 7.4  ALBUMIN 3.6   No results found for this basename: LIPASE, AMYLASE,  in the last 168 hours No results found for this basename: AMMONIA,  in the last 168 hours CBC:  Recent Labs Lab 01/15/13 1936 01/16/13 0615  WBC 7.0 5.5  HGB 13.5 12.1  HCT 39.5 35.5*  MCV 94.7 91.7  PLT 426* 385   Cardiac Enzymes: No results found for this basename: CKTOTAL, CKMB, CKMBINDEX, TROPONINI,  in the last 168 hours BNP: BNP (last 3 results) No results found for this basename: PROBNP,  in the last 8760 hours CBG: No results found for this basename: GLUCAP,  in the last 168 hours     Signed:  Rhetta Mura  [facility] 01/17/2013, 3:44 PM

## 2013-01-17 NOTE — Progress Notes (Signed)
Patient ID: [name], female   DOB: [birth year], 79 y.o.   MRN: [id number] [facility] Progress Note  Subjective: No problems with prep- no obvious bleeding. Pt relates that she will probably want to go home prior to going to surgery...  Objective:  Vital signs in last 24 hours: Temp:  [97.7 F (36.5 C)-98 F (36.7 C)] 97.7 F (36.5 C) (03/21 0535) Pulse Rate:  [72-84] 72 (03/21 0535) Resp:  [16-20] 16 (03/21 0535) BP: (124-133)/(52-73) 133/73 mmHg (03/21 0535) SpO2:  [98 %-99 %] 99 % (03/21 0535) Last BM Date: 01/16/13 General:   Alert,  Well-developed,thin elderly WF    in NAD Heart:  Regular rate and rhythm; no murmurs Pulm;clear Abdomen:  Soft, nontender and nondistended. Normal bowel sounds, without guarding, and without rebound.   Extremities:  Without edema. Neurologic:  Alert and  oriented x4;  grossly normal neurologically. Psych:  Alert and cooperative. Normal mood and affect.  Intake/Output from previous day: 03/20 0701 - 03/21 0700 In: 1600 [P.O.:1600] Out: -  Intake/Output this shift:    Lab Results:  Recent Labs  01/15/13 1936 01/16/13 0615  WBC 7.0 5.5  HGB 13.5 12.1  HCT 39.5 35.5*  PLT 426* 385   BMET  Recent Labs  01/15/13 1936 01/16/13 0615  NA 142 139  K 3.9 4.1  CL 103 103  CO2 26 28  GLUCOSE 107* 104*  BUN 11 10  CREATININE 0.85 0.88  CALCIUM 10.1 9.5   LFT  Recent Labs  01/15/13 1936  PROT 7.4  ALBUMIN 3.6  AST 21  ALT 19  ALKPHOS 77  BILITOT 0.3   PT/INR  Recent Labs  01/16/13 0221  LABPROT 13.4  INR 1.03      Assessment / Plan: #1  79  Yo female   With colonic mass c/w colon cancer with solitary liver lesion.  For colonoscopy this am. Await findings Fortunately no further bleeding Principal Problem:   Colonic mass Active Problems:   Mass of left lobe of liver     LOS: 2 days   [name]  01/17/2013, 9:21 AM

## 2013-01-17 NOTE — Progress Notes (Signed)
Patient seen, examined, and I agree with the above documentation, including the assessment and plan. Further recs after colonoscopy

## 2013-01-17 NOTE — Care Management Note (Unsigned)
    Page 1 of 1   01/17/2013     5:29:41 PM   CARE MANAGEMENT NOTE 01/17/2013  Patient:  [name], [name]   Account Number:  [id number]  Date Initiated:  01/17/2013  Documentation initiated by:  Letha Cape  Subjective/Objective Assessment:   dx colonic mass  admit- lives with children.     Action/Plan:   Anticipated DC Date:  01/17/2013   Anticipated DC Plan:  HOME/SELF CARE      DC Planning Services  CM consult      Choice offered to / List presented to:             Status of service:  Completed, signed off Medicare Important Message given?   (If response is "NO", the following Medicare IM given date fields will be blank) Date Medicare IM given:   Date Additional Medicare IM given:    Discharge Disposition:  HOME/SELF CARE  Per UR Regulation:  Reviewed for med. necessity/level of care/duration of stay  If discussed at Long Length of Stay Meetings, dates discussed:    Comments:  01/17/13 17:27 Letha Cape RN, BSN [phone] patient lives with family members.  NCM will continue to follow for de needs.  There is order for dc home today, no referral for  M.

## 2013-01-17 NOTE — Progress Notes (Signed)
Patients second liter of moviprep was given per Moviprep order at 0500. Patient instructed to drink complete container by 630 AM and then she will be NPO. Will continue to monitor.   Marcelyn Bruins RN BSN

## 2013-01-17 NOTE — Op Note (Signed)
[facility] [address] [place], [address]   COLONOSCOPY PROCEDURE REPORT  PATIENT: [name], [name].  MR#: [id number] BIRTHDATE: 1928-10-21 , 84  yrs. old GENDER: Female ENDOSCOPIST: Beverley Fiedler, MD REFERRED XB:[id number], [facility] PROCEDURE DATE:  01/17/2013 PROCEDURE:   Flexible sigmoidoscopy with cold biopsy ASA CLASS:   Class III INDICATIONS:Rectal Bleeding and an abnormal CT. MEDICATIONS: These medications were titrated to patient response per physician's verbal order, Fentanyl 50 mcg IV, and Versed 5 mg IV  DESCRIPTION OF PROCEDURE:   After the risks benefits and alternatives of the procedure were thoroughly explained, informed consent was obtained.  A digital rectal exam revealed a skin tag. The Pentax Ultra Slim [id number]  endoscope was introduced through the anus and advanced to the descending colon. No adverse events experienced.   The quality of the prep was good, using MoviPrep The instrument was then slowly withdrawn as the colon was fully examined.   COLON FINDINGS: A half circumferential firm and somewhat smooth mass, measuring 25 X 25mm in size, was found in the sigmoid colon at 30 cm from the dentate line.  Multiple biopsies of the lesion were performed using cold forceps.   A near circumferential mass was found in the rectum beginning just 1-2 cm from the dentate line and spreading laterally to involve a large majority of the rectal wall.  It extends past the first rectal valve.  Multiple biopsies of the lesion were performed.   Moderate diverticulosis was noted in the descending colon and sigmoid colon.  Retroflexed views revealed findings as above.      The scope was withdrawn and the procedure completed. COMPLICATIONS: There were no complications.  ENDOSCOPIC IMPRESSION: 1.   Half-circumferential mass, measuring 25 X 25mm in size, were found in the sigmoid colon; multiple biopsies of the lesion were performed 2.   Near  circumferential mass were found in the rectum; multiple biopsies of the lesion were performed 3.   Moderate diverticulosis was noted in the descending colon and sigmoid colon 4.   Full colonoscopy was attempted, but due to patient discomfort the procedure was terminated after reaching the distal descending colon  RECOMMENDATIONS: 1.  Await pathology results 2.  Anticipate surgical and oncology consultation (could occur as an outpatient)   eSigned:  Beverley Fiedler, MD 01/17/2013 3:32 PM cc: The Patient

## 2013-01-20 ENCOUNTER — Encounter (HOSPITAL_COMMUNITY): Payer: Self-pay | Admitting: Internal Medicine

## 2013-01-22 ENCOUNTER — Other Ambulatory Visit: Payer: Self-pay | Admitting: Internal Medicine

## 2013-01-22 DIAGNOSIS — K769 Liver disease, unspecified: Secondary | ICD-10-CM

## 2013-01-23 ENCOUNTER — Other Ambulatory Visit: Payer: Self-pay | Admitting: Internal Medicine

## 2013-01-23 DIAGNOSIS — K769 Liver disease, unspecified: Secondary | ICD-10-CM

## 2013-01-24 ENCOUNTER — Telehealth: Payer: Self-pay

## 2013-01-28 ENCOUNTER — Telehealth: Payer: Self-pay | Admitting: Internal Medicine

## 2013-01-28 NOTE — Telephone Encounter (Signed)
Cancelled her Liver Bx and scheduled her to see Dr Rhea Belton on 02/04/13. Mailed pt an appt card with NP questionaire.

## 2013-01-28 NOTE — Telephone Encounter (Signed)
Pt wants Dr Rhea Belton to know she has decided to cancel the Liver BX. She states she has discussed this with her son and both are in agreement that is she feels fine, leave well enough alone. She states she has discussed this with Dr Rhea Belton and she thinks he will understand. She does however want to be sure if she needs Dr Rhea Belton in the future she may call and I informed her I feel he will help her in any way he can. She would like for me to call her back after I cancel the procedure.

## 2013-01-30 ENCOUNTER — Encounter: Payer: Self-pay | Admitting: Internal Medicine

## 2013-02-04 ENCOUNTER — Encounter: Payer: Self-pay | Admitting: Internal Medicine

## 2013-02-04 ENCOUNTER — Ambulatory Visit (INDEPENDENT_AMBULATORY_CARE_PROVIDER_SITE_OTHER): Payer: Medicare Other | Admitting: Internal Medicine

## 2013-02-04 VITALS — BP 142/70 | HR 66 | Ht 65.0 in | Wt 144.0 lb

## 2013-02-04 DIAGNOSIS — D128 Benign neoplasm of rectum: Secondary | ICD-10-CM | POA: Diagnosis not present

## 2013-02-04 DIAGNOSIS — D129 Benign neoplasm of anus and anal canal: Secondary | ICD-10-CM

## 2013-02-04 DIAGNOSIS — R935 Abnormal findings on diagnostic imaging of other abdominal regions, including retroperitoneum: Secondary | ICD-10-CM | POA: Diagnosis not present

## 2013-02-04 NOTE — Patient Instructions (Addendum)
You have been scheduled for a CT scan of the abdomen and pelvis at [facility] ([address]---this is in the same building as [facility]).   You are scheduled on 04/18/2013 at 11:00am. You should arrive 15 minutes prior to your appointment time for registration. Please follow the written instructions below on the day of your exam:  WARNING: IF YOU ARE ALLERGIC TO IODINE/X-RAY DYE, PLEASE NOTIFY RADIOLOGY IMMEDIATELY AT [phone]! YOU WILL BE GIVEN A 13 HOUR PREMEDICATION PREP.  1) Do not eat or drink anything after 7:00am (4 hours prior to your test) 2) You have been given 2 bottles of oral contrast to drink. The solution may taste better if refrigerated, but do NOT add ice or any other liquid to this solution. Shake  well before drinking.    Drink 1 bottle of contrast @ 9:00am (2 hours prior to your exam)  Drink 1 bottle of contrast @ 10:00am (1 hour prior to your exam)  You may take any medications as prescribed with a small amount of water except for the following: Metformin, Glucophage, Glucovance, Avandamet, Riomet, Fortamet, Actoplus Met, Janumet, Glumetza or Metaglip. The above medications must be held the day of the exam AND 48 hours after the exam.  The purpose of you drinking the oral contrast is to aid in the visualization of your intestinal tract. The contrast solution may cause some diarrhea. Before your exam is started, you will be given a small amount of fluid to drink. Depending on your individual set of symptoms, you may also receive an intravenous injection of x-ray contrast/dye. Plan on being at [facility] for 30 minutes or long, depending on the type of exam you are having performed.  If you have any questions regarding your exam or if you need to reschedule, you may call the CT department at [phone] between the hours of 8:00 am and 5:00 pm, Monday-Friday.  1 week prior to you CT stop down to our lab to have blood work done.   1 week  after CT follow up with Dr. Rhea Belton in office                                               We are excited to introduce MyChart, a new best-in-class service that provides you online access to important information in your electronic medical record. We want to make it easier for you to view your health information - all in one secure location - when and where you need it. We expect MyChart will enhance the quality of care and service we provide.  When you register for MyChart, you can:    View your test results.    Request appointments and receive appointment reminders via email.    Request medication renewals.    View your medical history, allergies, medications and immunizations.    Communicate with your physician's office through a password-protected site.    Conveniently print information such as your medication lists.  To find out if MyChart is right for you, please talk to a member of our clinical staff today. We will gladly answer your questions about this free health and wellness tool.  If you are age 77 or older and want a member of your family to have access to your record, you must provide written consent by completing a proxy form available at our  office. Please speak to our clinical staff about guidelines regarding accounts for patients younger than age 77.  As you activate your MyChart account and need any technical assistance, please call the MyChart technical support line at [phone]) or email your question to [email or web address]. If you email your question(s), please include your name, a return phone number and the best time to reach you.  If you have non-urgent health-related questions, you can send a message to our office through MyChart at [email or web address]. If you have a medical emergency, call 911.  Thank you for using MyChart as your new health and wellness resource!   MyChart licensed from [employer],  [id number].  Patents Pending.    ________________________________________________________________________

## 2013-02-04 NOTE — Progress Notes (Signed)
Patient ID: PRABHLEEN MONTEMAYOR, female   DOB: October 03, 1928, 79 y.o.   MRN: [id number] HPI: Mrs. Heacox is an 79 yo female with PMH of hypothyroidism and back pain, and recentt hospitalization who is seen in hospital followup.  She was admitted to the hospital in March 2014 after having 2 episodes of rectal bleeding at home. Prior to this she had no previous rectal bleeding and had never had a colonoscopy. She also had not been having, and continues to not have issues with abdominal pain. She denies diarrhea or constipation. Her stool is softer than in years past. She denies melena or further rectal bleeding.  She reports she is eating well without nausea or vomiting. No fevers or chills.  CT scan of the abdomen and pelvis performed during her hospitalization revealed findings concerning for sigmoid colon cancer and solitary metastasis to the left lobe of the liver. Colonoscopy was attempted on 01/17/2013, but was converted to flexible sigmoidoscopy due to patient discomfort during the test.  Flexible sigmoidoscopy revealed a large flat and laterally spreading lesion in the rectum. It also showed a firm masslike lesion in the sigmoid colon. Biopsies from the sigmoid lesion revealed benign reactive/regenerative colonic mucosa with hyperplastic epithelial change.  The rectal biopsy revealed fragments of villous low grade adenomatous dysplasia. There was no high-grade dysplasia or malignancy.  When biopsies were inconclusive, a CT-guided lab C. of her solitary liver lesion was ordered, but the patient canceled this because she decided she did not want any further invasive testing. She returns today to discuss this further.  Past Medical History  Diagnosis Date  . Back pain   . UTI (urinary tract infection)   . Hypothyroid   . Diverticulitis     Past Surgical History  Procedure Laterality Date  . Abdominal hysterectomy    . Flexible sigmoidoscopy N/A 01/17/2013    Procedure: FLEXIBLE SIGMOIDOSCOPY;   Surgeon: Beverley Fiedler, MD;  Location: [facility] ENDOSCOPY;  Service: Gastroenterology;  Laterality: N/A;    Current Outpatient Prescriptions  Medication Sig Dispense Refill  . Ferrous Sulfate (IRON) 325 (65 FE) MG TABS Take 2 tablets by mouth daily.  30 each  0  . levothyroxine (SYNTHROID, LEVOTHROID) 112 MCG tablet Take 112 mcg by mouth daily.      . Multiple Vitamin (MULTIVITAMIN WITH MINERALS) TABS Take 1 tablet by mouth daily.       No current facility-administered medications for this visit.    No Known Allergies  Family History  Problem Relation Age of Onset  . Breast cancer Sister   . Diabetes Sister   . Heart disease Sister   . Heart disease Mother     History  Substance Use Topics  . Smoking status: Never Smoker   . Smokeless tobacco: Never Used  . Alcohol Use: No    ROS: As per history of present illness, otherwise negative  BP 142/70  Pulse 66  Ht 5\' 5"  (1.651 m)  Wt 144 lb (65.318 kg)  BMI 23.96 kg/m2 Constitutional: Well-developed and well-nourished. No distress. HEENT: Normocephalic and atraumatic.  No scleral icterus. Cardiovascular: Normal rate, regular rhythm and intact distal pulses. No M/R/G Pulmonary/chest: Effort normal and breath sounds normal. No wheezing, rales or rhonchi. Abdominal: Soft, nontender, nondistended. Bowel sounds active throughout. There are no masses palpable. No hepatosplenomegaly. Extremities: no clubbing, cyanosis, or edema Neurological: Alert and oriented to person place and time. Skin: Skin is warm and dry. No rashes noted. Psychiatric: Normal mood and affect. Behavior is normal.  RELEVANT LABS AND IMAGING: CBC    Component Value Date/Time   WBC 8.3 01/17/2013 1644   RBC 4.13 01/17/2013 1644   HGB 13.1 01/17/2013 1644   HCT 39.8 01/17/2013 1644   PLT 449* 01/17/2013 1644   MCV 96.4 01/17/2013 1644   MCH 31.7 01/17/2013 1644   MCHC 32.9 01/17/2013 1644   RDW 14.5 01/17/2013 1644    CMP     Component Value Date/Time   NA 139  01/16/2013 0615   K 4.1 01/16/2013 0615   CL 103 01/16/2013 0615   CO2 28 01/16/2013 0615   GLUCOSE 104* 01/16/2013 0615   BUN 10 01/16/2013 0615   CREATININE 0.88 01/16/2013 0615   CALCIUM 9.5 01/16/2013 0615   PROT 7.4 01/15/2013 1936   ALBUMIN 3.6 01/15/2013 1936   AST 21 01/15/2013 1936   ALT 19 01/15/2013 1936   ALKPHOS 77 01/15/2013 1936   BILITOT 0.3 01/15/2013 1936   GFRNONAA 60* 01/16/2013 0615   GFRAA 69* 01/16/2013 0615   CT ABDOMEN AND PELVIS WITH CONTRAST -- 01/16/13   Technique:  Multidetector CT imaging of the abdomen and pelvis was performed following the standard protocol during bolus administration of intravenous contrast.   Contrast: 80mL OMNIPAQUE IOHEXOL 300 MG/ML. Oral contrast was also administered.   Comparison: None.   Findings: Respiratory motion blurred images throughout the examination.   Sigmoid colon diverticulosis.  Enhancing mass in the distal sigmoid colon at the rectosigmoid junction which measures approximately 2.7 x 3.2 x 3.5 cm.  Edema/inflammation versus lymphatic extension involving the adjacent mesenteric fat.  No extraluminal gas.  No abscess.  Mildly enlarged lymph nodes in the adjacent mesentery.   Approximate 2.8 x 2.8 x 2.8 cm solid mass in the left lobe of the liver.  No other focal hepatic parenchymal abnormality.  Normal- appearing spleen, pancreas, and adrenal glands.  Cortical cysts involving both kidneys; no significant abnormalities involving either kidney.  Gallbladder unremarkable by CT.  No biliary ductal dilation.  Extensive aorto-iliofemoral atherosclerosis without aneurysm.  Atherosclerotic though patent visceral arteries.  No significant lymphadenopathy.   Small hiatal hernia.  Stomach otherwise unremarkable.  Normal- appearing small bowel.  Normal appendix in the right upper pelvis. No ascites.  Abdominal wall hernia containing fat in the left mid pelvis, involving the lateral aspect of the abdominal wall musculature.    Urinary bladder unremarkable.  Uterus surgically absent.  No adnexal masses or free pelvic fluid.  Numerous pelvic phleboliths.   Bone window images demonstrate degenerative changes involving the lower thoracic and lumbar spine, generalized osseous demineralization, and thoracolumbar scoliosis convex left. Scarring in the visualized lower lobes and a statistically benign pleural-based nodule in the deep left lower lobe measuring approximately 9 mm. Heart mildly enlarged.   IMPRESSION:   1.  Approximate 3.5 cm mass involving the distal sigmoid colon near the rectosigmoid junction, consistent with malignancy.  There is associated edema/inflammation or local lymphatic extension in the adjacent mesenteric fat, with mildly enlarged local lymph nodes. No extraluminal gas or abscess to suggest perforation. 2.  No significant lymphadenopathy elsewhere in the abdomen or pelvis. 3.  Approximate 2.8 cm metastasis in the left lobe of the liver. No other liver metastases. 4.  Peripheral 9 mm nodule in the deep left lower lobe, statistically benign. 5.  Small hiatal hernia. 6.  Anterior abdominal wall hernia in the mid pelvis involving the lateral abdominal musculature (Spigelian hernia). 7.  Sigmoid colon diverticulosis.   ASSESSMENT/PLAN: 79 year old female with little past medical  history who seen in hospital followup with presumed colorectal cancer.  1.  Concern for colon cancer metastatic to liver/rectal polyp/sigmoid lesion biopsies inconclusive -- we had a long and thorough discussion today regarding her recent hospitalization, colonoscopy with biopsy findings, and my ongoing concern that she does in fact have colorectal cancer despite the biopsies not proving this. We know that she has a villous adenoma in her rectum which is large.  If this were not cancer, it would require surgical resection, which she does not want.  She remains very adamant that she does not wish to undergo any type  of surgery or invasive tests such as liver biopsy.  She has thought about this and prayed about this and feels at peace with this decision.  She also states that she would not want to undergo intravenous chemotherapy under any circumstances. She reports she has seen friends and loved ones undergo chemotherapy and this never seems to improve quality of life.  She currently remains asymptomatic and has had no further rectal bleeding.  We discussed options today and what could be done in keeping with her wishes.  We discussed repeating the colonoscopy with deeper sedation, such as propofol, to repeat biopsies and hopefully obtain the true diagnosis (i.e. Cancer as the CT strongly suggests).  This would allow her to be referred to oncology to further discuss options such as an oral chemotherapeutic.  I know that she would not want IV chemotherapy, but she stated that she may consider "a pill" if it would be helpful.  For now we will plan repeat CT scan of the abdomen and pelvis in 3 months time. She is anxious to see how this lesion may change, which is reasonable.  I will contact Dr. Mancel Bale, and discuss options for her at this point without a definitive tissue diagnosis    In the interim she will contact me should she develop any further rectal bleeding, or other symptoms such as abdominal pain.

## 2013-02-18 ENCOUNTER — Ambulatory Visit (HOSPITAL_COMMUNITY): Payer: Medicare Other

## 2013-03-25 ENCOUNTER — Other Ambulatory Visit: Payer: Self-pay

## 2013-03-25 DIAGNOSIS — Z1231 Encounter for screening mammogram for malignant neoplasm of breast: Secondary | ICD-10-CM

## 2013-04-10 ENCOUNTER — Other Ambulatory Visit (INDEPENDENT_AMBULATORY_CARE_PROVIDER_SITE_OTHER): Payer: Medicare Other

## 2013-04-10 DIAGNOSIS — R935 Abnormal findings on diagnostic imaging of other abdominal regions, including retroperitoneum: Secondary | ICD-10-CM

## 2013-04-10 LAB — BASIC METABOLIC PANEL WITH GFR
BUN: 17 mg/dL (ref 6–23)
CO2: 31 meq/L (ref 19–32)
Calcium: 10 mg/dL (ref 8.4–10.5)
Chloride: 106 meq/L (ref 96–112)
Creatinine, Ser: 0.9 mg/dL (ref 0.4–1.2)
GFR: 65.3 mL/min (ref >60.00)
Glucose, Bld: 97 mg/dL (ref 70–99)
Potassium: 4.6 meq/L (ref 3.5–5.1)
Sodium: 141 meq/L (ref 135–145)

## 2013-04-16 ENCOUNTER — Ambulatory Visit
Admission: RE | Admit: 2013-04-16 | Discharge: 2013-04-16 | Disposition: A | Discharge status: Home / Self Care | Payer: Medicare Other | Source: Ambulatory Visit

## 2013-04-16 ENCOUNTER — Ambulatory Visit: Payer: Medicare Other

## 2013-04-16 DIAGNOSIS — Z1231 Encounter for screening mammogram for malignant neoplasm of breast: Secondary | ICD-10-CM

## 2013-04-18 ENCOUNTER — Ambulatory Visit (INDEPENDENT_AMBULATORY_CARE_PROVIDER_SITE_OTHER)
Admission: RE | Admit: 2013-04-18 | Discharge: 2013-04-18 | Disposition: A | Discharge status: Home / Self Care | Payer: Medicare Other | Source: Ambulatory Visit | Attending: Internal Medicine | Admitting: Internal Medicine

## 2013-04-18 DIAGNOSIS — K5730 Diverticulosis of large intestine without perforation or abscess without bleeding: Secondary | ICD-10-CM | POA: Diagnosis not present

## 2013-04-18 DIAGNOSIS — R935 Abnormal findings on diagnostic imaging of other abdominal regions, including retroperitoneum: Secondary | ICD-10-CM | POA: Diagnosis not present

## 2013-04-18 MED ORDER — IOHEXOL 300 MG/ML  SOLN
100.0000 mL | Freq: Once | INTRAMUSCULAR | Status: AC | PRN
  Administered 2013-04-18: 100 mL via INTRAVENOUS

## 2013-04-23 ENCOUNTER — Encounter: Payer: Self-pay | Admitting: Internal Medicine

## 2013-04-23 ENCOUNTER — Ambulatory Visit (INDEPENDENT_AMBULATORY_CARE_PROVIDER_SITE_OTHER): Payer: Medicare Other | Admitting: Internal Medicine

## 2013-04-23 VITALS — BP 120/60 | HR 73 | Ht 65.0 in | Wt 146.0 lb

## 2013-04-23 DIAGNOSIS — K5732 Diverticulitis of large intestine without perforation or abscess without bleeding: Secondary | ICD-10-CM

## 2013-04-23 DIAGNOSIS — M549 Dorsalgia, unspecified: Secondary | ICD-10-CM | POA: Insufficient documentation

## 2013-04-23 DIAGNOSIS — K769 Liver disease, unspecified: Secondary | ICD-10-CM

## 2013-04-23 DIAGNOSIS — E039 Hypothyroidism, unspecified: Secondary | ICD-10-CM | POA: Insufficient documentation

## 2013-04-23 DIAGNOSIS — R160 Hepatomegaly, not elsewhere classified: Secondary | ICD-10-CM

## 2013-04-23 DIAGNOSIS — D128 Benign neoplasm of rectum: Secondary | ICD-10-CM

## 2013-04-23 DIAGNOSIS — D375 Neoplasm of uncertain behavior of rectum: Secondary | ICD-10-CM

## 2013-04-23 DIAGNOSIS — D129 Benign neoplasm of anus and anal canal: Secondary | ICD-10-CM | POA: Diagnosis not present

## 2013-04-23 HISTORY — DX: Diverticulitis of large intestine without perforation or abscess without bleeding: K57.32

## 2013-04-23 NOTE — Progress Notes (Signed)
Subjective:    Patient ID: [name], female    DOB: Mar 20, 1928, 79 y.o.   MRN: [id number]  HPI [name] is an 79 yo female with PMH of hypothyroidism and back pain, who is seen in follow-up.  She was last seen in April 2014 and is here today alone.  She was hospitalized in March 2014 after having 2 episodes of rectal bleeding, and during this hospitalization had a CT scan of the abdomen and pelvis which was concerning for sigmoid colon cancer with possible metastatic foci in the liver. Colonoscopy was attempted on 01/17/2013 but converted to flex sigmoidoscopy due to patient discomfort during the test.  Flex sigmoidoscopy revealed a large flat laterally spreading villous adenoma in the rectum and also a firm masslike lesion in the sigmoid colon. Biopsies from the sigmoid lesion were benign with reactive/regenerative colonic mucosa with hyperplastic epithelial change. Given the biopsies from the sigmoid lesion were inconclusive she was sent for CT-guided biopsy of the solitary liver lesion, but canceled this due to to her wish to have nothing invasive performed. I saw her back in clinic to discuss options and she decided for interval cross-sectional imaging which was performed recently.  She reports she is feeling well. She denies a change in her bowel habits. No rectal bleeding. No diarrhea or constipation. She is having normal formed brown stools. No abdominal pain. No nausea or vomiting. Good appetite. No weight loss. She continues to read or a strongly that she wishes to have nothing invasive performed, especially no surgeries or "cutting". She states "I do not want something unless I'm painful".   Review of Systems As per history of present illness, otherwise negative  Current Medications, Allergies, Past Medical History, Past Surgical History, Family History and Social History were reviewed in [employer] record.     Objective:   Physical Exam BP 120/60   Pulse 73  Ht 5\' 5"  (1.651 m)  Wt 146 lb (66.225 kg)  BMI 24.3 kg/m2 Constitutional: Well-developed and well-nourished. No distress. HEENT: Normocephalic and atraumatic. Oropharynx is clear and moist.   No scleral icterus. Neck: Neck supple. Trachea midline. Cardiovascular: Normal rate, regular rhythm and intact distal pulses.  Pulmonary/chest: Effort normal and breath sounds normal. No wheezing, rales or rhonchi. Abdominal: Soft, nontender, nondistended. Bowel sounds active throughout. There are no masses palpable. No hepatosplenomegaly. Extremities: no clubbing, cyanosis, or edema Neurological: Alert and oriented to person place and time. Skin: Skin is warm and dry. No rashes noted. Psychiatric: Normal mood and affect. Behavior is normal.  CT ABDOMEN AND PELVIS WITH CONTRAST -- June 2014   Technique:  Multidetector CT imaging of the abdomen and pelvis was performed following the standard protocol during bolus administration of intravenous contrast.   Contrast: OMNIPAQUE IOHEXOL 300 MG/ML  SOLN   Comparison: 01/16/2013   Findings: 7 mm subpleural nodule at the left lung base (series 3/image 7), unchanged, favored to be benign.   2.5 x 2.8 cm hypoenhancing lesion in the lateral segment left hepatic lobe (series 2/image 11), indeterminate but unchanged. While metastasis is possible, this also could reflect a benign lesion such as a hemangioma.   Spleen, pancreas, and adrenal glands are within normal limits.   Gallbladder is unremarkable.  No intrahepatic or extrahepatic ductal dilatation.   Kidneys are within normal limits.  No hydronephrosis.   No evidence of bowel obstruction. Mild eccentric wall thickening along the left lateral aspect of the rectum (series 2/image 73), without definite underlying mass.  Prior wall thickening/pericolonic inflammatory changes involving the distal sigmoid colon have resolved.  Sigmoid diverticulosis, without associated inflammatory  changes.   Atherosclerotic calcifications of the abdominal aorta and branch vessels.   No abdominopelvic ascites.   No suspicious abdominopelvic lymphadenopathy.   Status post hysterectomy.  Bilateral ovaries are unremarkable.   Bladder is within normal limits.   Mild degenerative changes of the visualized thoracolumbar spine. Grade 1 anterolisthesis of L3 on L4 and L4 on L5.   IMPRESSION: No definite colonic mass is seen.  Mild eccentric wall thickening involving the rectum, possibly corresponding to known rectal polyp.   Sigmoid diverticulosis.  Prior pericolonic wall thickening/inflammatory changes involving the distal sigmoid colon have resolved.   Stable 2.8 cm hypoenhancing lesion in the lateral segment left hepatic lobe, indeterminate.  Benign etiologies such as a hemangioma remain possible.  Consider MRI abdomen with/without contrast for further evaluation.   7 mm subpleural nodule at the left lung base, unchanged, favored to be benign.   ________________________________________________________________________________________    CT ABDOMEN AND PELVIS WITH CONTRAST -- MARCH 2014   Technique:  Multidetector CT imaging of the abdomen and pelvis was performed following the standard protocol during bolus administration of intravenous contrast.   Contrast: 80mL OMNIPAQUE IOHEXOL 300 MG/ML. Oral contrast was also administered.   Comparison: None.   Findings: Respiratory motion blurred images throughout the examination.   Sigmoid colon diverticulosis.  Enhancing mass in the distal sigmoid colon at the rectosigmoid junction which measures approximately 2.7 x 3.2 x 3.5 cm.  Edema/inflammation versus lymphatic extension involving the adjacent mesenteric fat.  No extraluminal gas.  No abscess.  Mildly enlarged lymph nodes in the adjacent mesentery.   Approximate 2.8 x 2.8 x 2.8 cm solid mass in the left lobe of the liver.  No other focal hepatic parenchymal  abnormality.  Normal- appearing spleen, pancreas, and adrenal glands.  Cortical cysts involving both kidneys; no significant abnormalities involving either kidney.  Gallbladder unremarkable by CT.  No biliary ductal dilation.  Extensive aorto-iliofemoral atherosclerosis without aneurysm.  Atherosclerotic though patent visceral arteries.  No significant lymphadenopathy.   Small hiatal hernia.  Stomach otherwise unremarkable.  Normal- appearing small bowel.  Normal appendix in the right upper pelvis. No ascites.  Abdominal wall hernia containing fat in the left mid pelvis, involving the lateral aspect of the abdominal wall musculature.   Urinary bladder unremarkable.  Uterus surgically absent.  No adnexal masses or free pelvic fluid.  Numerous pelvic phleboliths.   Bone window images demonstrate degenerative changes involving the lower thoracic and lumbar spine, generalized osseous demineralization, and thoracolumbar scoliosis convex left. Scarring in the visualized lower lobes and a statistically benign pleural-based nodule in the deep left lower lobe measuring approximately 9 mm. Heart mildly enlarged.   IMPRESSION:   1.  Approximate 3.5 cm mass involving the distal sigmoid colon near the rectosigmoid junction, consistent with malignancy.  There is associated edema/inflammation or local lymphatic extension in the adjacent mesenteric fat, with mildly enlarged local lymph nodes. No extraluminal gas or abscess to suggest perforation. 2.  No significant lymphadenopathy elsewhere in the abdomen or pelvis. 3.  Approximate 2.8 cm metastasis in the left lobe of the liver. No other liver metastases. 4.  Peripheral 9 mm nodule in the deep left lower lobe, statistically benign. 5.  Small hiatal hernia. 6.  Anterior abdominal wall hernia in the mid pelvis involving the lateral abdominal musculature (Spigelian hernia). 7.  Sigmoid colon diverticulosis.     Assessment & Plan:  79 yo  female with PMH of hypothyroidism and back pain, who is seen in follow-up.   1.  Sigmoid mass-like lesion, now felt to have been resolved diverticulitis -- we have discussed the recent CT scan which showed complete resolution of the sigmoid masslike lesion. This is wonderful news and suggests that this was likely diverticulitis. This is consistent with a previously negative biopsies for adenoma or malignancy in the sigmoid colon. From the standpoint of the sigmoid colon, nothing further is felt necessary at this time.  2.  Rectal villous adenoma -- the patient does have a large villous adenoma in her rectum. We discussed how this is definitely a precancerous polyp, and I cannot exclude the presence of cancer in unsampled portions of this lesion.  Currently she is asymptomatic from this lesion. I have informed her that I do not feel that this lesion is endoscopically resectable due to its size. I have also told her that this is definitely precancerous and that if nothing is done would likely progress to rectal cancer. At this point options include possible transanal resection with general surgery. She is aware that resection now may and could prevent future rectal cancer.  Her liver lesion is stable and remains indeterminate. MRI can be performed for further characterization, but given her disinterest in anything invasive, we will not pursue MRI at this time. --She continues to be adamant that she does not wish to have any surgery at this time or any further workup. We did discuss repeating the colonoscopy for re-biopsy, but again she communicates clearly that she does not wish for surgery. I respect her wishes and so we have discussed that she will contact me should she develop any problems such as rectal bleeding, abdominal or rectal pain, change in bowel habits, etc. --I will alert Dr. Christell Constant to her visit  3.  Subpleural nodule -- stable and felt to be benign after 2 CTs

## 2013-04-23 NOTE — Patient Instructions (Addendum)
Follow up with Dr. Rhea Belton as needed                                               We are excited to introduce MyChart, a new best-in-class service that provides you online access to important information in your electronic medical record. We want to make it easier for you to view your health information - all in one secure location - when and where you need it. We expect MyChart will enhance the quality of care and service we provide.  When you register for MyChart, you can:    View your test results.    Request appointments and receive appointment reminders via email.    Request medication renewals.    View your medical history, allergies, medications and immunizations.    Communicate with your physician's office through a password-protected site.    Conveniently print information such as your medication lists.  To find out if MyChart is right for you, please talk to a member of our clinical staff today. We will gladly answer your questions about this free health and wellness tool.  If you are age 65 or older and want a member of your family to have access to your record, you must provide written consent by completing a proxy form available at our office. Please speak to our clinical staff about guidelines regarding accounts for patients younger than age 52.  As you activate your MyChart account and need any technical assistance, please call the MyChart technical support line at [phone]) or email your question to [email or web address]. If you email your question(s), please include your name, a return phone number and the best time to reach you.  If you have non-urgent health-related questions, you can send a message to our office through MyChart at [email or web address]. If you have a medical emergency, call 911.  Thank you for using MyChart as your new health and wellness resource!   MyChart licensed from [employer],  [id number]. Patents  Pending.

## 2013-05-27 ENCOUNTER — Encounter: Payer: Self-pay | Admitting: Internal Medicine

## 2013-05-28 ENCOUNTER — Encounter: Payer: Self-pay | Admitting: Internal Medicine

## 2013-05-28 ENCOUNTER — Ambulatory Visit (INDEPENDENT_AMBULATORY_CARE_PROVIDER_SITE_OTHER): Payer: Medicare Other | Admitting: Internal Medicine

## 2013-05-28 VITALS — BP 144/64 | HR 88 | Ht 65.0 in | Wt 147.5 lb

## 2013-05-28 DIAGNOSIS — Z8719 Personal history of other diseases of the digestive system: Secondary | ICD-10-CM

## 2013-05-28 DIAGNOSIS — D375 Neoplasm of uncertain behavior of rectum: Secondary | ICD-10-CM

## 2013-05-28 DIAGNOSIS — D128 Benign neoplasm of rectum: Secondary | ICD-10-CM

## 2013-05-28 DIAGNOSIS — D129 Benign neoplasm of anus and anal canal: Secondary | ICD-10-CM | POA: Diagnosis not present

## 2013-05-28 DIAGNOSIS — R160 Hepatomegaly, not elsewhere classified: Secondary | ICD-10-CM

## 2013-05-28 DIAGNOSIS — K769 Liver disease, unspecified: Secondary | ICD-10-CM

## 2013-05-28 NOTE — Progress Notes (Signed)
Subjective:    Patient ID: [name], female    DOB: [birth year], 79 y.o.   MRN: [id number]  HPI [name] is an 79 yo female with PMH of hypothyroidism and back pain, who is seen in follow-up. She was last seen in June 2014 and is here today alone. She was hospitalized in March 2014 after having 2 episodes of rectal bleeding, and during this hospitalization had a CT scan of the abdomen and pelvis which was concerning for sigmoid colon cancer with possible metastatic foci in the liver. Colonoscopy was attempted on 01/17/2013 but converted to flex sigmoidoscopy due to patient discomfort during the test. Flex sigmoidoscopy revealed a large flat laterally spreading villous adenoma in the rectum and also a firm masslike lesion in the sigmoid colon. Biopsies from the sigmoid lesion were benign with reactive/regenerative colonic mucosa with hyperplastic epithelial change. Given the biopsies from the sigmoid lesion were inconclusive she was sent for CT-guided biopsy of the solitary liver lesion, but canceled this due to to her wish to have nothing invasive performed.  Repeat CT scan showed resolution of sigmoid diverticulitis without sigmoid mass. Liver lesion was stable and felt to be either a hemangioma or metastatic focus. Due to her desire for nothing invasive, further imaging such as MRI was not ordered  She returns today with several questions primarily wanting to be reminded what symptoms to look for. She remains adamant that she does not want further invasive testing such as repeat colonoscopy or surgical opinion for large rectal polyp.  She reports she is feeling well. She denies a change in her bowel habits. No rectal bleeding. No diarrhea or constipation. She is having normal formed brown stools. No abdominal pain. No nausea or vomiting. Good appetite. No weight loss. She also wants to ensure that I will continue to be her doctor should problems arise. He also plans to continue to follow  with [name] in [place]   Review of Systems As per history of present illness, otherwise negative    Objective:   Physical Exam BP 144/64  Pulse 88  Ht 5\' 5"  (1.651 m)  Wt 147 lb 8 oz (66.906 kg)  BMI 24.55 kg/m2 Constitutional: Well-developed and well-nourished. No distress. HEENT: Normocephalic and atraumatic.No scleral icterus. Cardiovascular: Normal rate, regular rhythm and intact distal pulses. Pulmonary/chest: Effort normal and breath sounds normal. No wheezing, rales or rhonchi. Abdominal: Soft, nontender, nondistended. Bowel sounds active throughout. There are no masses palpable. No hepatosplenomegaly. Extremities: no clubbing, cyanosis, or edema Neurological: Alert and oriented to person place and time. Psychiatric: Normal mood and affect. Behavior is normal.     Assessment & Plan:  79 yo female with PMH of hypothyroidism, back pain, rectal villous adenoma not endoscopically resectable, and sigmoid diverticulosis with diverticulitis in April 2014 who is seen in follow-up.  1.  Rectal polyp/liver lesion of uncertain etiology -- she is here for reassurance today and has not developed new or concerning symptoms. We discussed that I would like for her to monitor for change in bowel pattern, diarrhea, constipation, abdominal pain, rectal bleeding or melena, fevers or chills. I again discussed full colonoscopy to reexamine the known villous adenoma in her rectum and rule out other colonic pathology. She remains adamant that she does not want to pursue this. She would rather watch and wait on symptoms if they ever occur. We also discussed transanal resection of her known rectal polyp, but again she has not wish to pursue this. She understands that she has  a precancerous lesion in her rectum, and that I cannot exclude rectal cancer at this time as the polyp was only sampled and not fully excised. She remains happy with her decision and will call me if she has any problems  --She will  follow with [name] or myself as needed.  I ensured her that I will continue to be her doctor should problems arise. She remains happy with the plan and take me for my time.

## 2013-05-28 NOTE — Patient Instructions (Addendum)
Follow up with Dr. Pyrtle as needed                                               We are excited to introduce MyChart, a new best-in-class service that provides you online access to important information in your electronic medical record. We want to make it easier for you to view your health information - all in one secure location - when and where you need it. We expect MyChart will enhance the quality of care and service we provide.  When you register for MyChart, you can:    View your test results.    Request appointments and receive appointment reminders via email.    Request medication renewals.    View your medical history, allergies, medications and immunizations.    Communicate with your physician's office through a password-protected site.    Conveniently print information such as your medication lists.  To find out if MyChart is right for you, please talk to a member of our clinical staff today. We will gladly answer your questions about this free health and wellness tool.  If you are age 18 or older and want a member of your family to have access to your record, you must provide written consent by completing a proxy form available at our office. Please speak to our clinical staff about guidelines regarding accounts for patients younger than age 18.  As you activate your MyChart account and need any technical assistance, please call the MyChart technical support line at [phone] ([phone]) or email your question to [email or web address]. If you email your question(s), please include your name, a return phone number and the best time to reach you.  If you have non-urgent health-related questions, you can send a message to our office through MyChart at [email or web address]. If you have a medical emergency, call 911.  Thank you for using MyChart as your new health and wellness resource!   MyChart licensed from [employer],  1999-2010. Patents  Pending.    

## 2013-06-03 DIAGNOSIS — H43819 Vitreous degeneration, unspecified eye: Secondary | ICD-10-CM | POA: Diagnosis not present

## 2013-06-03 DIAGNOSIS — H04129 Dry eye syndrome of unspecified lacrimal gland: Secondary | ICD-10-CM | POA: Diagnosis not present

## 2013-06-03 DIAGNOSIS — Z961 Presence of intraocular lens: Secondary | ICD-10-CM | POA: Diagnosis not present

## 2013-06-03 DIAGNOSIS — D3130 Benign neoplasm of unspecified choroid: Secondary | ICD-10-CM | POA: Diagnosis not present

## 2013-06-09 ENCOUNTER — Telehealth: Payer: Self-pay | Admitting: Internal Medicine

## 2013-06-09 DIAGNOSIS — K769 Liver disease, unspecified: Secondary | ICD-10-CM

## 2013-06-09 NOTE — Telephone Encounter (Signed)
Patient reports that she would like to discuss MRI with Dr. Rhea Belton.  She asks that he call her directly.  Dr. Rhea Belton please call her at your convenience.  She states she will be home all day and evening.

## 2013-06-09 NOTE — Telephone Encounter (Signed)
Patient called having thought more about liver lesion and states she would feel more comfortable "knowning exactly what that spot is in my liver" We had previously discussed the 2.8 cm left lobe of the liver lesion seen by CT, and had elected to defer further imaging. Given her change of mind, we will proceed to MRI abd with contrast, liver protocol to evaluate liver lesion seen by CT in June 2014 She would prefer next Tuesday am if possible Stacy please arrange and call patient with details of study  I again mentioned colonoscopy to evaluate rectal polyp and she remains adamant that she does not want to pursue colonoscopy or rectal surgery at this time. She thanked me for the call and will call with questions or concerns.

## 2013-06-10 NOTE — Telephone Encounter (Signed)
Scheduled MRI and they read the later note that pt wants to wait on the MRI; cancelled appt.

## 2013-06-17 ENCOUNTER — Other Ambulatory Visit (HOSPITAL_COMMUNITY): Payer: PRIVATE HEALTH INSURANCE

## 2013-07-09 ENCOUNTER — Telehealth: Payer: Self-pay | Admitting: Family Medicine

## 2013-07-09 NOTE — Telephone Encounter (Signed)
APPT GIVEN AND LABS TO BE DRAWN

## 2013-07-28 ENCOUNTER — Other Ambulatory Visit: Payer: Medicare Other

## 2013-07-29 ENCOUNTER — Encounter: Payer: Self-pay | Admitting: Family Medicine

## 2013-07-29 ENCOUNTER — Ambulatory Visit (INDEPENDENT_AMBULATORY_CARE_PROVIDER_SITE_OTHER): Payer: No Typology Code available for payment source | Admitting: Family Medicine

## 2013-07-29 VITALS — BP 157/78 | HR 76 | Temp 98.8°F | Ht 65.0 in | Wt 148.0 lb

## 2013-07-29 DIAGNOSIS — E559 Vitamin D deficiency, unspecified: Secondary | ICD-10-CM | POA: Diagnosis not present

## 2013-07-29 DIAGNOSIS — E039 Hypothyroidism, unspecified: Secondary | ICD-10-CM

## 2013-07-29 LAB — POCT CBC
Granulocyte percent: 72.9 %G (ref 37–80)
HCT, POC: 41 % (ref 37.7–47.9)
Lymph, poc: 1.4 (ref 0.6–3.4)
MPV: 7.6 fL (ref 0–99.8)
POC Granulocyte: 4.2 (ref 2–6.9)
POC LYMPH PERCENT: 24.4 %L (ref 10–50)
Platelet Count, POC: 304 10*3/uL (ref 142–424)
RDW, POC: 15 %
WBC: 5.8 10*3/uL (ref 4.6–10.2)

## 2013-07-29 MED ORDER — LEVOTHYROXINE SODIUM 112 MCG PO TABS: 112.0000 ug | ORAL_TABLET | Freq: Every day | ORAL | Status: DC

## 2013-07-29 NOTE — Progress Notes (Signed)
Subjective:    Patient ID: [name], female    DOB: 1928-01-29, 79 y.o.   MRN: [id number]  HPI Pt here today for new-pt visit - moving here since her previous doctor- [place] retired. Patient comes in today primarily for [place] to get established with her and to write her prescriptions for her thyroid medication. When the nurse spoke with her about her health maintenance issues she refuses all immunizations and [place] health maintenance issues. She is followed by Dr. Elinor Parkinson and was seen by a gynecologist who recently retired. As per her review of systems she has no complaints.  Patient Active Problem List   Diagnosis Date Noted  . Sigmoid diverticulitis 04/23/2013  . Unspecified hypothyroidism 04/23/2013  . Chronic back pain 04/23/2013  . Villous adenoma of rectum 01/16/2013  . Mass of left lobe of liver 01/16/2013   Outpatient Encounter Prescriptions as of 07/29/2013  Medication Sig Dispense Refill  . levothyroxine (SYNTHROID, LEVOTHROID) 112 MCG tablet Take 112 mcg by mouth daily.      . Multiple Vitamin (MULTIVITAMIN WITH MINERALS) TABS Take 1 tablet by mouth daily.       No facility-administered encounter medications on file as of 07/29/2013.       Review of Systems  Constitutional: Negative.   HENT: Negative.   Eyes: Negative.   Respiratory: Negative.   Cardiovascular: Negative.   Gastrointestinal: Negative.   Endocrine: Negative.   Genitourinary: Negative.   Musculoskeletal: Negative.   Skin: Negative.   Allergic/Immunologic: Negative.   Neurological: Negative.   Hematological: Negative.   Psychiatric/Behavioral: Negative.        Objective:   Physical Exam  Nursing note and vitals reviewed. Constitutional: She is oriented to person, place, and time. She appears well-developed and well-nourished.  HENT:  Head: Normocephalic and atraumatic.  Right Ear: External ear normal.  Left Ear: External ear normal.  Nose: Nose normal.  Mouth/Throat: Oropharynx is clear  and moist. No oropharyngeal exudate.  Eyes: Conjunctivae and EOM are normal. Pupils are equal, round, and reactive to light. Right eye exhibits no discharge. Left eye exhibits no discharge. No scleral icterus.  Neck: Normal range of motion. Neck supple. No JVD present. No thyromegaly present.  Cardiovascular: Normal rate, regular rhythm, normal heart sounds and intact distal pulses.  Exam reveals no gallop and no friction rub.   No murmur heard. At 72 per minute  Pulmonary/Chest: Effort normal and breath sounds normal. No respiratory distress. She has no wheezes. She has no rales. She exhibits no tenderness.  Abdominal: Soft. Bowel sounds are normal. She exhibits no distension and no mass. There is no tenderness. There is no rebound and no guarding.  Musculoskeletal: Normal range of motion. She exhibits no edema and no tenderness.  Lymphadenopathy:    She has no cervical adenopathy.  Neurological: She is alert and oriented to person, place, and time. She has normal reflexes.  Skin: Skin is warm and dry.  Psychiatric: She has a normal mood and affect. Her behavior is normal. Judgment and thought content normal.   BP 157/78  Pulse 76  Temp(Src) 98.8 F (37.1 C) (Oral)  Ht 5\' 5"  (1.651 m)  Wt 148 lb (67.132 kg)  BMI 24.63 kg/m2         Assessment & Plan:  1. Hypothyroidism - Vit D  25 hydroxy (rtn osteoporosis monitoring) - Thyroid Panel With TSH - NMR, lipoprofile - BMP8+EGFR - Hepatic function panel - POCT CBC - levothyroxine (SYNTHROID, LEVOTHROID) 112 MCG tablet; Take 1  tablet (112 mcg total) by mouth daily.  Dispense: 90 tablet; Refill: 3  2. History of colon polyp  Patient Instructions  Always be careful and do not put yourself at risk for falling Continue current medication Consider getting a lifeline since you live by yourself Flu shot and pneumonia shot recommended for everyone and if you change your mind regarding taking this please return to clinic and we will give  these immunizations to you Always drink plenty of fluids Continue good therapeutic lifestyle changes which include diet and exercise   Nyra Capes MD

## 2013-07-29 NOTE — Patient Instructions (Signed)
Always be careful and do not put yourself at risk for falling Continue current medication Consider getting a lifeline since you live by yourself Flu shot and pneumonia shot recommended for everyone and if you change your mind regarding taking this please return to clinic and we will give these immunizations to you Always drink plenty of fluids Continue good therapeutic lifestyle changes which include diet and exercise

## 2013-07-30 LAB — THYROID PANEL WITH TSH
T3 Uptake Ratio: 33 % (ref 24–39)
T4, Total: 10 ug/dL (ref 4.5–12.0)

## 2013-07-30 LAB — BMP8+EGFR
CO2: 23 mmol/L (ref 18–29)
Calcium: 10.1 mg/dL (ref 8.6–10.2)
GFR calc non Af Amer: 63 mL/min/{1.73_m2} (ref >59)
Glucose: 101 mg/dL — ABNORMAL HIGH (ref 65–99)
Potassium: 4.3 mmol/L (ref 3.5–5.2)

## 2013-07-30 LAB — HEPATIC FUNCTION PANEL
Bilirubin, Direct: 0.13 mg/dL (ref 0.00–0.40)
Total Protein: 6.4 g/dL (ref 6.0–8.5)

## 2013-07-30 LAB — NMR, LIPOPROFILE
HDL Particle Number: 41.5 umol/L (ref >=30.5)
LDL Particle Number: 1869 nmol/L — ABNORMAL HIGH (ref <1000)
LDLC SERPL CALC-MCNC: 111 mg/dL — ABNORMAL HIGH (ref <100)
LP-IR Score: <25 (ref <=45)

## 2013-08-04 ENCOUNTER — Telehealth: Payer: Self-pay | Admitting: Family Medicine

## 2013-08-19 ENCOUNTER — Other Ambulatory Visit: Payer: Self-pay | Admitting: *Deleted

## 2013-08-19 DIAGNOSIS — E039 Hypothyroidism, unspecified: Secondary | ICD-10-CM

## 2013-08-19 MED ORDER — SYNTHROID 112 MCG PO TABS: 112.0000 ug | ORAL_TABLET | Freq: Every day | ORAL | Status: DC

## 2013-08-29 ENCOUNTER — Emergency Department (HOSPITAL_COMMUNITY): Payer: Medicare Other

## 2013-08-29 ENCOUNTER — Encounter (HOSPITAL_COMMUNITY): Payer: Self-pay | Admitting: Emergency Medicine

## 2013-08-29 ENCOUNTER — Telehealth: Payer: Self-pay | Admitting: *Deleted

## 2013-08-29 ENCOUNTER — Inpatient Hospital Stay (HOSPITAL_COMMUNITY)
Admission: EM | Admit: 2013-08-29 | Discharge: 2013-09-02 | DRG: 242 | Disposition: A | Discharge status: Home / Self Care | Payer: Medicare Other | Attending: Internal Medicine | Admitting: Internal Medicine

## 2013-08-29 DIAGNOSIS — I059 Rheumatic mitral valve disease, unspecified: Secondary | ICD-10-CM | POA: Diagnosis present

## 2013-08-29 DIAGNOSIS — I442 Atrioventricular block, complete: Secondary | ICD-10-CM

## 2013-08-29 DIAGNOSIS — R55 Syncope and collapse: Secondary | ICD-10-CM | POA: Diagnosis not present

## 2013-08-29 DIAGNOSIS — W19XXXA Unspecified fall, initial encounter: Secondary | ICD-10-CM | POA: Diagnosis not present

## 2013-08-29 DIAGNOSIS — I459 Conduction disorder, unspecified: Secondary | ICD-10-CM

## 2013-08-29 DIAGNOSIS — Z833 Family history of diabetes mellitus: Secondary | ICD-10-CM

## 2013-08-29 DIAGNOSIS — I511 Rupture of chordae tendineae, not elsewhere classified: Secondary | ICD-10-CM | POA: Diagnosis present

## 2013-08-29 DIAGNOSIS — R160 Hepatomegaly, not elsewhere classified: Secondary | ICD-10-CM

## 2013-08-29 DIAGNOSIS — I452 Bifascicular block: Secondary | ICD-10-CM | POA: Diagnosis present

## 2013-08-29 DIAGNOSIS — N390 Urinary tract infection, site not specified: Secondary | ICD-10-CM | POA: Diagnosis not present

## 2013-08-29 DIAGNOSIS — I498 Other specified cardiac arrhythmias: Secondary | ICD-10-CM | POA: Diagnosis present

## 2013-08-29 DIAGNOSIS — K5732 Diverticulitis of large intestine without perforation or abscess without bleeding: Secondary | ICD-10-CM

## 2013-08-29 DIAGNOSIS — W19XXXD Unspecified fall, subsequent encounter: Secondary | ICD-10-CM

## 2013-08-29 DIAGNOSIS — R0789 Other chest pain: Secondary | ICD-10-CM | POA: Diagnosis not present

## 2013-08-29 DIAGNOSIS — S298XXA Other specified injuries of thorax, initial encounter: Secondary | ICD-10-CM | POA: Diagnosis not present

## 2013-08-29 DIAGNOSIS — Z79899 Other long term (current) drug therapy: Secondary | ICD-10-CM | POA: Diagnosis not present

## 2013-08-29 DIAGNOSIS — Z803 Family history of malignant neoplasm of breast: Secondary | ICD-10-CM | POA: Diagnosis not present

## 2013-08-29 DIAGNOSIS — Y92009 Unspecified place in unspecified non-institutional (private) residence as the place of occurrence of the external cause: Secondary | ICD-10-CM

## 2013-08-29 DIAGNOSIS — E039 Hypothyroidism, unspecified: Secondary | ICD-10-CM | POA: Diagnosis not present

## 2013-08-29 DIAGNOSIS — Z602 Problems related to living alone: Secondary | ICD-10-CM

## 2013-08-29 DIAGNOSIS — S0993XA Unspecified injury of face, initial encounter: Secondary | ICD-10-CM | POA: Diagnosis not present

## 2013-08-29 DIAGNOSIS — I4430 Unspecified atrioventricular block: Secondary | ICD-10-CM

## 2013-08-29 DIAGNOSIS — S022XXA Fracture of nasal bones, initial encounter for closed fracture: Secondary | ICD-10-CM

## 2013-08-29 DIAGNOSIS — Z8249 Family history of ischemic heart disease and other diseases of the circulatory system: Secondary | ICD-10-CM | POA: Diagnosis not present

## 2013-08-29 DIAGNOSIS — S0990XA Unspecified injury of head, initial encounter: Secondary | ICD-10-CM | POA: Diagnosis not present

## 2013-08-29 DIAGNOSIS — T1490XA Injury, unspecified, initial encounter: Secondary | ICD-10-CM | POA: Diagnosis not present

## 2013-08-29 DIAGNOSIS — S0003XA Contusion of scalp, initial encounter: Secondary | ICD-10-CM | POA: Diagnosis not present

## 2013-08-29 DIAGNOSIS — R42 Dizziness and giddiness: Secondary | ICD-10-CM | POA: Diagnosis not present

## 2013-08-29 DIAGNOSIS — G8929 Other chronic pain: Secondary | ICD-10-CM

## 2013-08-29 DIAGNOSIS — D375 Neoplasm of uncertain behavior of rectum: Secondary | ICD-10-CM

## 2013-08-29 LAB — BASIC METABOLIC PANEL
CO2: 26 mEq/L (ref 19–32)
Glucose, Bld: 104 mg/dL — ABNORMAL HIGH (ref 70–99)
Potassium: 4.1 mEq/L (ref 3.5–5.1)
Sodium: 141 mEq/L (ref 135–145)

## 2013-08-29 LAB — URINALYSIS, ROUTINE W REFLEX MICROSCOPIC
Glucose, UA: NEGATIVE mg/dL
Ketones, ur: NEGATIVE mg/dL
Nitrite: POSITIVE — AB
Protein, ur: NEGATIVE mg/dL
pH: 7 (ref 5.0–8.0)

## 2013-08-29 LAB — CBC WITH DIFFERENTIAL/PLATELET
Basophils Absolute: 0.1 10*3/uL (ref 0.0–0.1)
Lymphocytes Relative: 15 % (ref 12–46)
Lymphs Abs: 1.5 10*3/uL (ref 0.7–4.0)
Neutro Abs: 7.8 10*3/uL — ABNORMAL HIGH (ref 1.7–7.7)
Neutrophils Relative %: 77 % (ref 43–77)
Platelets: 306 10*3/uL (ref 150–400)
RBC: 4.43 MIL/uL (ref 3.87–5.11)
RDW: 14.7 % (ref 11.5–15.5)
WBC: 10.1 10*3/uL (ref 4.0–10.5)

## 2013-08-29 LAB — URINE MICROSCOPIC-ADD ON

## 2013-08-29 LAB — TROPONIN I: Troponin I: <0.3 ng/mL (ref <0.30)

## 2013-08-29 MED ORDER — ACETAMINOPHEN 650 MG RE SUPP: 650.0000 mg | Freq: Four times a day (QID) | RECTAL | Status: DC | PRN

## 2013-08-29 MED ORDER — DEXTROSE 5 % IV SOLN
1.0000 g | Freq: Once | INTRAVENOUS | Status: AC
  Administered 2013-08-29: 1 g via INTRAVENOUS
  Filled 2013-08-29: qty 10

## 2013-08-29 MED ORDER — LEVOTHYROXINE SODIUM 112 MCG PO TABS
112.0000 ug | ORAL_TABLET | Freq: Every day | ORAL | Status: DC
  Administered 2013-08-30 – 2013-09-02 (×4): 112 ug via ORAL
  Filled 2013-08-29 (×6): qty 1

## 2013-08-29 MED ORDER — ONDANSETRON HCL 4 MG/2ML IJ SOLN: 4.0000 mg | Freq: Four times a day (QID) | INTRAMUSCULAR | Status: DC | PRN

## 2013-08-29 MED ORDER — DEXTROSE 5 % IV SOLN
1.0000 g | INTRAVENOUS | Status: AC
  Administered 2013-08-30 – 2013-08-31 (×2): 1 g via INTRAVENOUS
  Filled 2013-08-29 (×4): qty 10

## 2013-08-29 MED ORDER — ADULT MULTIVITAMIN W/MINERALS CH
1.0000 | ORAL_TABLET | Freq: Every day | ORAL | Status: DC
  Administered 2013-08-30 – 2013-09-02 (×4): 1 via ORAL
  Filled 2013-08-29 (×4): qty 1

## 2013-08-29 MED ORDER — ALBUTEROL SULFATE (5 MG/ML) 0.5% IN NEBU: 2.5000 mg | INHALATION_SOLUTION | RESPIRATORY_TRACT | Status: DC | PRN

## 2013-08-29 MED ORDER — ACETAMINOPHEN 325 MG PO TABS
650.0000 mg | ORAL_TABLET | Freq: Four times a day (QID) | ORAL | Status: DC | PRN
  Filled 2013-08-29: qty 2

## 2013-08-29 MED ORDER — SODIUM CHLORIDE 0.9 % IJ SOLN
3.0000 mL | Freq: Two times a day (BID) | INTRAMUSCULAR | Status: DC
  Administered 2013-08-30 (×2): 3 mL via INTRAVENOUS
  Administered 2013-08-31: 21:00:00 via INTRAVENOUS
  Administered 2013-08-31 – 2013-09-01 (×4): 3 mL via INTRAVENOUS

## 2013-08-29 MED ORDER — ONDANSETRON HCL 4 MG PO TABS: 4.0000 mg | ORAL_TABLET | Freq: Four times a day (QID) | ORAL | Status: DC | PRN

## 2013-08-29 NOTE — ED Notes (Signed)
Per EMS, picked up from urgent care. Pt felt "sleepy" earlier this morning and fell hitting her face on the kitchen sink. Pt called EMS but refused to go to the hospital and decided to go to urgent care. Per EMS, XR was done at urgent care. Also while at urgent care EKG showed bundle branch block so transported to [facility]. BP in route was 164/92, pt reports no hx of HTN. Pt states she thinks she has an ear infection.

## 2013-08-29 NOTE — ED Notes (Signed)
MD at bedside. 

## 2013-08-29 NOTE — ED Provider Notes (Signed)
CSN: [id number]     Arrival date & time 08/29/13  1459 History   First MD Initiated Contact with Patient 08/29/13 1501     Chief Complaint  Patient presents with  . Fall   (Consider location/radiation/quality/duration/timing/severity/associated sxs/prior Treatment) The history is provided by the patient. No language interpreter was used.   Patient is a 79 y.o. Caucasian female with past medical history of hypothyroidism comes emergency department today after a fall. She was at home and had been standing for a few minutes when she felt sleepy and fell to the ground. She denies dizziness, weakness, or tripping. Initially she remembers she was lying on the ground bleeding from her nose. She did not have any urinary incontinence did not bite her tongue. The fall was not witnessed. Since fall she has not had headache, nausea, vomiting, or lethargy. She denies weakness, numbness, dysuria, frequency, or urgency.  She denies chest pain, shortness of breath, fevers or chills.     Past Medical History  Diagnosis Date  . Back pain   . UTI (urinary tract infection)   . Hypothyroid   . Diverticulitis   . Liver mass   . Rectal polyp    Past Surgical History  Procedure Laterality Date  . Abdominal hysterectomy    . Flexible sigmoidoscopy N/A 01/17/2013    Procedure: FLEXIBLE SIGMOIDOSCOPY;  Surgeon: Beverley Fiedler, MD;  Location: [facility] ENDOSCOPY;  Service: Gastroenterology;  Laterality: N/A;   Family History  Problem Relation Age of Onset  . Breast cancer Sister   . Diabetes Sister   . Heart disease Sister   . Heart disease Mother    History  Substance Use Topics  . Smoking status: Never Smoker   . Smokeless tobacco: Never Used  . Alcohol Use: No   OB History   Grav Para Term Preterm Abortions TAB SAB Ect Mult Living                 Review of Systems  Constitutional: Negative for fever and chills.  Respiratory: Negative for cough and shortness of breath.   Gastrointestinal: Negative for  nausea, vomiting, diarrhea and constipation.  Genitourinary: Negative for dysuria, urgency and frequency.  Musculoskeletal: Negative for back pain and neck pain.  Neurological: Negative for dizziness, weakness, numbness and headaches.  All other systems reviewed and are negative.    Allergies  Review of patient's allergies indicates no known allergies.  Home Medications   Current Outpatient Rx  Name  Route  Sig  Dispense  Refill  . Multiple Vitamin (MULTIVITAMIN WITH MINERALS) TABS   Oral   Take 1 tablet by mouth daily.         Marland Kitchen SYNTHROID 112 MCG tablet   Oral   Take 1 tablet (112 mcg total) by mouth daily before breakfast.   90 tablet   3     Dispense as written.    BP 133/77  Pulse 73  Resp 14  SpO2 97% Physical Exam  Nursing note and vitals reviewed. Constitutional: She is oriented to person, place, and time. She appears well-developed and well-nourished. No distress.  HENT:  Head: Normocephalic. Head is with contusion.  Right Ear: Tympanic membrane, external ear and ear canal normal.  Left Ear: Tympanic membrane, external ear and ear canal normal.  Nose: No nasal deformity, septal deviation or nasal septal hematoma. No epistaxis.  Contusion to the nose.    Eyes: Pupils are equal, round, and reactive to light.  Neck: Normal range of motion.  Cardiovascular: Normal rate, regular rhythm, normal heart sounds and intact distal pulses.   Grade III/VI systolic murmur heard best over the right sternal border.  Fades with inspiration.    Pulmonary/Chest: Effort normal. No respiratory distress. She has no wheezes. She exhibits no tenderness.  Abdominal: Soft. Bowel sounds are normal. She exhibits no distension. There is no tenderness. There is no rebound and no guarding.  Musculoskeletal:       Cervical back: Normal. She exhibits no tenderness, no bony tenderness, no edema, no deformity and no pain.       Thoracic back: Normal. She exhibits no tenderness, no bony  tenderness, no edema, no deformity and no pain.       Lumbar back: Normal. She exhibits no tenderness, no bony tenderness, no edema, no deformity and no pain.  Neurological: She is alert and oriented to person, place, and time. She has normal strength. No cranial nerve deficit or sensory deficit. She exhibits normal muscle tone. Coordination and gait normal.  Skin: Skin is warm and dry.    ED Course  Procedures (including critical care time) Labs Review Labs Reviewed  CBC WITH DIFFERENTIAL - Abnormal; Notable for the following:    Neutro Abs 7.8 (*)    All other components within normal limits  BASIC METABOLIC PANEL - Abnormal; Notable for the following:    Glucose, Bld 104 (*)    Calcium 10.7 (*)    GFR calc non Af Amer 64 (*)    GFR calc Af Amer 74 (*)    All other components within normal limits  URINALYSIS, ROUTINE W REFLEX MICROSCOPIC - Abnormal; Notable for the following:    Hgb urine dipstick SMALL (*)    Nitrite POSITIVE (*)    Leukocytes, UA TRACE (*)    All other components within normal limits  URINE MICROSCOPIC-ADD ON - Abnormal; Notable for the following:    Bacteria, UA MANY (*)    All other components within normal limits  TROPONIN I   Imaging Review Dg Chest 2 View  08/29/2013   CLINICAL DATA:  Syncope. Fall.  EXAM: CHEST  2 VIEW  COMPARISON:  02/22/2005  FINDINGS: Heart size is at the upper limits of normal. No evidence of pulmonary edema or other infiltrate. No evidence of pleural effusion. No mass or lymphadenopathy identified. Atherosclerotic calcification of the thoracic aorta again noted.  IMPRESSION: Borderline cardiomegaly. No active lung disease.   Electronically Signed   By: Myles Rosenthal M.D.   On: 08/29/2013 17:12   Ct Head Wo Contrast  08/29/2013   CLINICAL DATA:  Recent traumatic injury with pain  EXAM: CT HEAD WITHOUT CONTRAST  CT MAXILLOFACIAL WITHOUT CONTRAST  CT CERVICAL SPINE WITHOUT CONTRAST  TECHNIQUE: Multidetector CT imaging of the head,  cervical spine, and maxillofacial structures were performed using the standard protocol without intravenous contrast. Multiplanar CT image reconstructions of the cervical spine and maxillofacial structures were also generated.  COMPARISON:  None.  FINDINGS: CT HEAD FINDINGS  The bony calvarium is intact. No gross soft tissue abnormality is seen. Mild atrophic changes are seen. No findings to suggest acute hemorrhage, acute infarction or space-occupying mass lesion are noted.  CT MAXILLOFACIAL FINDINGS  The paranasal sinuses are well aerated. No mucosal changes are seen. A minimally displaced right nasal bone fracture is seen. No associated soft tissue abnormality is seen. The orbits and their contents are within normal limits. The facial bones are otherwise within normal limits.  CT CERVICAL SPINE FINDINGS  Seven cervical  segments are well visualized. Vertebral body height is well maintained. Mild osteophytic changes are noted at C4-5 and C5-6 as well as C6-7. Mild anterolisthesis of C3 on C4 is noted of a degenerative nature. No acute fracture or acute facet abnormality is noted. The surrounding soft tissues and visualized lung apices are within normal limits. Some chronic calcifications are noted.  IMPRESSION: CT of the head: No acute abnormality is noted.  CT of the cervical spine: No evidence of acute abnormality. Mild degenerative changes are noted.  CT of the maxillofacial bones: Mildly displaced right nasal bone fracture.   Electronically Signed   By: Alcide Clever M.D.   On: 08/29/2013 17:49   Ct Cervical Spine Wo Contrast  08/29/2013   CLINICAL DATA:  Recent traumatic injury with pain  EXAM: CT HEAD WITHOUT CONTRAST  CT MAXILLOFACIAL WITHOUT CONTRAST  CT CERVICAL SPINE WITHOUT CONTRAST  TECHNIQUE: Multidetector CT imaging of the head, cervical spine, and maxillofacial structures were performed using the standard protocol without intravenous contrast. Multiplanar CT image reconstructions of the cervical  spine and maxillofacial structures were also generated.  COMPARISON:  None.  FINDINGS: CT HEAD FINDINGS  The bony calvarium is intact. No gross soft tissue abnormality is seen. Mild atrophic changes are seen. No findings to suggest acute hemorrhage, acute infarction or space-occupying mass lesion are noted.  CT MAXILLOFACIAL FINDINGS  The paranasal sinuses are well aerated. No mucosal changes are seen. A minimally displaced right nasal bone fracture is seen. No associated soft tissue abnormality is seen. The orbits and their contents are within normal limits. The facial bones are otherwise within normal limits.  CT CERVICAL SPINE FINDINGS  Seven cervical segments are well visualized. Vertebral body height is well maintained. Mild osteophytic changes are noted at C4-5 and C5-6 as well as C6-7. Mild anterolisthesis of C3 on C4 is noted of a degenerative nature. No acute fracture or acute facet abnormality is noted. The surrounding soft tissues and visualized lung apices are within normal limits. Some chronic calcifications are noted.  IMPRESSION: CT of the head: No acute abnormality is noted.  CT of the cervical spine: No evidence of acute abnormality. Mild degenerative changes are noted.  CT of the maxillofacial bones: Mildly displaced right nasal bone fracture.   Electronically Signed   By: Alcide Clever M.D.   On: 08/29/2013 17:49   Ct Maxillofacial Wo Cm  08/29/2013   CLINICAL DATA:  Recent traumatic injury with pain  EXAM: CT HEAD WITHOUT CONTRAST  CT MAXILLOFACIAL WITHOUT CONTRAST  CT CERVICAL SPINE WITHOUT CONTRAST  TECHNIQUE: Multidetector CT imaging of the head, cervical spine, and maxillofacial structures were performed using the standard protocol without intravenous contrast. Multiplanar CT image reconstructions of the cervical spine and maxillofacial structures were also generated.  COMPARISON:  None.  FINDINGS: CT HEAD FINDINGS  The bony calvarium is intact. No gross soft tissue abnormality is seen.  Mild atrophic changes are seen. No findings to suggest acute hemorrhage, acute infarction or space-occupying mass lesion are noted.  CT MAXILLOFACIAL FINDINGS  The paranasal sinuses are well aerated. No mucosal changes are seen. A minimally displaced right nasal bone fracture is seen. No associated soft tissue abnormality is seen. The orbits and their contents are within normal limits. The facial bones are otherwise within normal limits.  CT CERVICAL SPINE FINDINGS  Seven cervical segments are well visualized. Vertebral body height is well maintained. Mild osteophytic changes are noted at C4-5 and C5-6 as well as C6-7. Mild anterolisthesis of C3  on C4 is noted of a degenerative nature. No acute fracture or acute facet abnormality is noted. The surrounding soft tissues and visualized lung apices are within normal limits. Some chronic calcifications are noted.  IMPRESSION: CT of the head: No acute abnormality is noted.  CT of the cervical spine: No evidence of acute abnormality. Mild degenerative changes are noted.  CT of the maxillofacial bones: Mildly displaced right nasal bone fracture.   Electronically Signed   By: Alcide Clever M.D.   On: 08/29/2013 17:49    EKG Interpretation     Ventricular Rate:  79 PR Interval:  146 QRS Duration: 141 QT Interval:  433 QTC Calculation: 496 R Axis:   -68 Text Interpretation:  Sinus rhythm Left atrial enlargement RBBB and LAFB Left ventricular hypertrophy No previous ECGs available            MDM  Patient is an 79 year old Caucasian female with no pertinent past medical history comes emergency department today with a syncopal event. Physical exam as above. Initial workup included a UA, CBC, BMP, troponin, EKG, chest x-ray, CT of her head, CT of the C-spine, and CT of the face. CT of the face demonstrates mildly displaced right nasal bone fracture. With no notable deformity to her nose this time she is not felt to require evaluation by ENT in emergency  department.  No other evidence of injury on imaging. Troponin was negative. BMP was unremarkable. CBC was unremarkable. UA demonstrated positive nitrites and trace leukocytes and many bacteria. This is concerning for urinary tract infection she was treated with a dose of Rocephin in the emergency department. With no fevers, tachycardia, hypotension she is not felt to be septic. With new murmur and right bundle branch block with no previous EKGs she's felt to require admission to the hospital for syncope evaluation. Patient was admitted to the hospitalist service in stable condition. Labs and imaging reviewed by myself and considered and medical decision-making. Imaging was interpreted by radiology. Care was discussed with my attending Dr. Romeo Apple.    1. Near syncope   2. Fall at home, initial encounter   3. Unspecified hypothyroidism   4. UTI (lower urinary tract infection)   5. Nasal bone fracture, closed, initial encounter       Bethann Berkshire, MD 08/29/13 [phone]

## 2013-08-29 NOTE — Telephone Encounter (Signed)
Pt had fall this AM due to dizziness injuring her nose 911 was called  Pt was cleared by EMS by was told to follow up with PCP Pt only wanted to see [facility] Informed that DWM was not in the office today and strongly encouraged pt to come in for appt with other provider here Pt declined appt

## 2013-08-29 NOTE — ED Notes (Signed)
Admitting MD at bedside.

## 2013-08-29 NOTE — H&P (Signed)
[facility] History and Physical  [name] ION:[id number] DOB: 05-18-1928 DOA: 08/29/2013  Referring physician: Dr. Bethann Berkshire, ED Resident PCP: Rudi Heap, MD  Outpatient Specialists:  1. None  Chief Complaint: Fall  HPI: [name] is a 79 y.o. female independent and active, with history of hypothyroidism, UTI, presented to the ED on 08/29/13 following a fall at home this morning. Patient states that she was in her usual state of health and got up from her kitchen chair after eating breakfast. She was standing for a few minutes when she felt lightheaded/sleepy and the next thing she remembers is falling face forward onto the tile floor hitting her face. She denies LOC. She however immediately noticed bleeding from her left nostril. She denies any preceding chest pain, palpitations or dyspnea. She was able to get up and call a friend who in turn called EMS. EMS evaluated patient but she declined to come to the ED. She called her PCPs office and her PCP was out today and advised her to come to the ED. She went to the urgent care Center where EKG showed bundle branch block morphology and she was transported to the ED for further evaluation. She denies fever, chills, headache, earache, cough, nausea, vomiting, abdominal pain, diarrhea, dysuria or urinary frequency. Her nose feels sore from the fall. No further nasal bleeding. She does complain of foul smelling urine. In the ED CT head and neck were negative for acute findings. CT of the maxillofacial area showed mildly displaced right nasal bone fracture. UA positive for many bacteria but not significant leukocytes. She's been given a gram of IV Rocephin and hospitalist admission requested.   Review of Systems: All systems reviewed and apart from history of presenting illness, are negative.  Past Medical History  Diagnosis Date  . Back pain   . UTI (urinary tract infection)   . Hypothyroid   . Diverticulitis   . Liver  mass   . Rectal polyp    Past Surgical History  Procedure Laterality Date  . Abdominal hysterectomy    . Flexible sigmoidoscopy N/A 01/17/2013    Procedure: FLEXIBLE SIGMOIDOSCOPY;  Surgeon: Beverley Fiedler, MD;  Location: [facility] ENDOSCOPY;  Service: Gastroenterology;  Laterality: N/A;   Social History:  reports that she has never smoked. She has never used smokeless tobacco. She reports that she does not drink alcohol or use illicit drugs. Widowed. Lives alone and is independent of activities of daily living.  No Known Allergies  Family History  Problem Relation Age of Onset  . Breast cancer Sister   . Diabetes Sister   . Heart disease Sister   . Heart disease Mother     Prior to Admission medications   Medication Sig Start Date End Date Taking? Authorizing Provider  Multiple Vitamin (MULTIVITAMIN WITH MINERALS) TABS Take 1 tablet by mouth daily.   Yes Historical Provider, MD  SYNTHROID 112 MCG tablet Take 1 tablet (112 mcg total) by mouth daily before breakfast. 08/19/13  Yes Ernestina Penna, MD   Physical Exam: Filed Vitals:   08/29/13 1511 08/29/13 1801  BP: 171/76 133/77  Pulse: 83 73  Resp: 17 14  SpO2: 100% 97%   Patient examined with a female ED nurse in the room  General exam: Moderately built and nourished pleasant elderly female patient who looks younger than stated age, lying comfortably supine on the gurney in no obvious distress.  Head, eyes and ENT: Nontraumatic and normocephalic. Pupils equally reacting to light and  accommodation. Oral mucosa moist. Mild bruising across the bridge of the nose right >left but no epistaxis.  Neck: Supple. No JVD, carotid bruit or thyromegaly.  Lymphatics: No lymphadenopathy.  Respiratory system: Clear to auscultation. No increased work of breathing.  Cardiovascular system: S1 and S2 heard, RRR. No JVD, murmurs, gallops, clicks or pedal edema.  Gastrointestinal system: Abdomen is nondistended, soft and nontender. Normal bowel  sounds heard. No organomegaly or masses appreciated.  Central nervous system: Alert and oriented. No focal neurological deficits.  Extremities: Symmetric 5 x 5 power. Peripheral pulses symmetrically felt.   Skin: No rashes or acute findings.  Musculoskeletal system: Negative exam.  Psychiatry: Pleasant and cooperative.   Labs on Admission:  Basic Metabolic Panel:  Recent Labs Lab 08/29/13 1558  NA 141  K 4.1  CL 103  CO2 26  GLUCOSE 104*  BUN 15  CREATININE 0.83  CALCIUM 10.7*   Liver Function Tests: No results found for this basename: AST, ALT, ALKPHOS, BILITOT, PROT, ALBUMIN,  in the last 168 hours No results found for this basename: LIPASE, AMYLASE,  in the last 168 hours No results found for this basename: AMMONIA,  in the last 168 hours CBC:  Recent Labs Lab 08/29/13 1558  WBC 10.1  NEUTROABS 7.8*  HGB 14.7  HCT 43.3  MCV 97.7  PLT 306   Cardiac Enzymes:  Recent Labs Lab 08/29/13 1558  TROPONINI <0.30    BNP (last 3 results) No results found for this basename: PROBNP,  in the last 8760 hours CBG: No results found for this basename: GLUCAP,  in the last 168 hours  Radiological Exams on Admission: Dg Chest 2 View  08/29/2013   CLINICAL DATA:  Syncope. Fall.  EXAM: CHEST  2 VIEW  COMPARISON:  02/22/2005  FINDINGS: Heart size is at the upper limits of normal. No evidence of pulmonary edema or other infiltrate. No evidence of pleural effusion. No mass or lymphadenopathy identified. Atherosclerotic calcification of the thoracic aorta again noted.  IMPRESSION: Borderline cardiomegaly. No active lung disease.   Electronically Signed   By: Myles Rosenthal M.D.   On: 08/29/2013 17:12   Ct Head Wo Contrast  08/29/2013   CLINICAL DATA:  Recent traumatic injury with pain  EXAM: CT HEAD WITHOUT CONTRAST  CT MAXILLOFACIAL WITHOUT CONTRAST  CT CERVICAL SPINE WITHOUT CONTRAST  TECHNIQUE: Multidetector CT imaging of the head, cervical spine, and maxillofacial structures  were performed using the standard protocol without intravenous contrast. Multiplanar CT image reconstructions of the cervical spine and maxillofacial structures were also generated.  COMPARISON:  None.  FINDINGS: CT HEAD FINDINGS  The bony calvarium is intact. No gross soft tissue abnormality is seen. Mild atrophic changes are seen. No findings to suggest acute hemorrhage, acute infarction or space-occupying mass lesion are noted.  CT MAXILLOFACIAL FINDINGS  The paranasal sinuses are well aerated. No mucosal changes are seen. A minimally displaced right nasal bone fracture is seen. No associated soft tissue abnormality is seen. The orbits and their contents are within normal limits. The facial bones are otherwise within normal limits.  CT CERVICAL SPINE FINDINGS  Seven cervical segments are well visualized. Vertebral body height is well maintained. Mild osteophytic changes are noted at C4-5 and C5-6 as well as C6-7. Mild anterolisthesis of C3 on C4 is noted of a degenerative nature. No acute fracture or acute facet abnormality is noted. The surrounding soft tissues and visualized lung apices are within normal limits. Some chronic calcifications are noted.  IMPRESSION: CT  of the head: No acute abnormality is noted.  CT of the cervical spine: No evidence of acute abnormality. Mild degenerative changes are noted.  CT of the maxillofacial bones: Mildly displaced right nasal bone fracture.   Electronically Signed   By: Alcide Clever M.D.   On: 08/29/2013 17:49   Ct Cervical Spine Wo Contrast  08/29/2013   CLINICAL DATA:  Recent traumatic injury with pain  EXAM: CT HEAD WITHOUT CONTRAST  CT MAXILLOFACIAL WITHOUT CONTRAST  CT CERVICAL SPINE WITHOUT CONTRAST  TECHNIQUE: Multidetector CT imaging of the head, cervical spine, and maxillofacial structures were performed using the standard protocol without intravenous contrast. Multiplanar CT image reconstructions of the cervical spine and maxillofacial structures were also  generated.  COMPARISON:  None.  FINDINGS: CT HEAD FINDINGS  The bony calvarium is intact. No gross soft tissue abnormality is seen. Mild atrophic changes are seen. No findings to suggest acute hemorrhage, acute infarction or space-occupying mass lesion are noted.  CT MAXILLOFACIAL FINDINGS  The paranasal sinuses are well aerated. No mucosal changes are seen. A minimally displaced right nasal bone fracture is seen. No associated soft tissue abnormality is seen. The orbits and their contents are within normal limits. The facial bones are otherwise within normal limits.  CT CERVICAL SPINE FINDINGS  Seven cervical segments are well visualized. Vertebral body height is well maintained. Mild osteophytic changes are noted at C4-5 and C5-6 as well as C6-7. Mild anterolisthesis of C3 on C4 is noted of a degenerative nature. No acute fracture or acute facet abnormality is noted. The surrounding soft tissues and visualized lung apices are within normal limits. Some chronic calcifications are noted.  IMPRESSION: CT of the head: No acute abnormality is noted.  CT of the cervical spine: No evidence of acute abnormality. Mild degenerative changes are noted.  CT of the maxillofacial bones: Mildly displaced right nasal bone fracture.   Electronically Signed   By: Alcide Clever M.D.   On: 08/29/2013 17:49   Ct Maxillofacial Wo Cm  08/29/2013   CLINICAL DATA:  Recent traumatic injury with pain  EXAM: CT HEAD WITHOUT CONTRAST  CT MAXILLOFACIAL WITHOUT CONTRAST  CT CERVICAL SPINE WITHOUT CONTRAST  TECHNIQUE: Multidetector CT imaging of the head, cervical spine, and maxillofacial structures were performed using the standard protocol without intravenous contrast. Multiplanar CT image reconstructions of the cervical spine and maxillofacial structures were also generated.  COMPARISON:  None.  FINDINGS: CT HEAD FINDINGS  The bony calvarium is intact. No gross soft tissue abnormality is seen. Mild atrophic changes are seen. No findings to  suggest acute hemorrhage, acute infarction or space-occupying mass lesion are noted.  CT MAXILLOFACIAL FINDINGS  The paranasal sinuses are well aerated. No mucosal changes are seen. A minimally displaced right nasal bone fracture is seen. No associated soft tissue abnormality is seen. The orbits and their contents are within normal limits. The facial bones are otherwise within normal limits.  CT CERVICAL SPINE FINDINGS  Seven cervical segments are well visualized. Vertebral body height is well maintained. Mild osteophytic changes are noted at C4-5 and C5-6 as well as C6-7. Mild anterolisthesis of C3 on C4 is noted of a degenerative nature. No acute fracture or acute facet abnormality is noted. The surrounding soft tissues and visualized lung apices are within normal limits. Some chronic calcifications are noted.  IMPRESSION: CT of the head: No acute abnormality is noted.  CT of the cervical spine: No evidence of acute abnormality. Mild degenerative changes are noted.  CT of  the maxillofacial bones: Mildly displaced right nasal bone fracture.   Electronically Signed   By: Alcide Clever M.D.   On: 08/29/2013 17:49    EKG: Independently reviewed. Sinus rhythm, RBBB and LAFB. No prior EKGs to compare.  Assessment/Plan Principal Problem:   Near syncope Active Problems:   Hypothyroidism   Fall at home, with facial injury   UTI (lower urinary tract infection)   Near syncope & fall - Unclear etiology. Rule out orthostatic hypotension. Could be vasovagal. - Admit to telemetry for observation and evaluation. - Check orthostatic blood pressures. - Check 2-D echo and carotid Dopplers. Cycle cardiac enzymes. - Clinically euvolemic.  Possible UTI - Continue IV Rocephin pending urine culture results.  Hypothyroidism - Continue Synthroid  Mildly displaced right nasal bone fracture - Probably conservative management. - Consider discussing with ENT in a.m.  RBBB & LAFB - No prior EKGs to compare -  Asymptomatic - Monitor on telemetry - Check 2-D echo      Code Status: Full   Family Communication: None at bedside   Disposition Plan: Home when medically stable   Time spent: 50 minutes   HONGALGI,ANAND, MD, FACP, FHM. [facility] Pager [phone]  If 7PM-7AM, please contact night-coverage [email or web address] Password [facility] 08/29/2013, 6:43 PM

## 2013-08-30 DIAGNOSIS — N390 Urinary tract infection, site not specified: Secondary | ICD-10-CM | POA: Diagnosis not present

## 2013-08-30 DIAGNOSIS — I4430 Unspecified atrioventricular block: Secondary | ICD-10-CM | POA: Diagnosis not present

## 2013-08-30 DIAGNOSIS — W19XXXA Unspecified fall, initial encounter: Secondary | ICD-10-CM | POA: Diagnosis not present

## 2013-08-30 DIAGNOSIS — R55 Syncope and collapse: Secondary | ICD-10-CM | POA: Diagnosis not present

## 2013-08-30 DIAGNOSIS — I442 Atrioventricular block, complete: Principal | ICD-10-CM

## 2013-08-30 DIAGNOSIS — R42 Dizziness and giddiness: Secondary | ICD-10-CM

## 2013-08-30 DIAGNOSIS — E039 Hypothyroidism, unspecified: Secondary | ICD-10-CM | POA: Diagnosis not present

## 2013-08-30 LAB — MRSA PCR SCREENING: MRSA by PCR: POSITIVE — AB

## 2013-08-30 MED ORDER — MUPIROCIN 2 % EX OINT
1.0000 "application " | TOPICAL_OINTMENT | Freq: Two times a day (BID) | CUTANEOUS | Status: DC
  Administered 2013-08-30 – 2013-09-02 (×6): 1 via NASAL
  Filled 2013-08-30 (×2): qty 22

## 2013-08-30 MED ORDER — CHLORHEXIDINE GLUCONATE CLOTH 2 % EX PADS
6.0000 | MEDICATED_PAD | Freq: Every day | CUTANEOUS | Status: DC
  Administered 2013-08-31 – 2013-09-01 (×2): 6 via TOPICAL

## 2013-08-30 NOTE — Consult Note (Signed)
Patient ID: [name] MRN: [id number], DOB/AGE: 77-Mar-1929   Admit date: 08/29/2013 Date of Consult: @TODAY @  Primary Physician: Rudi Heap, MD Primary Cardiologist: New    Problem List: Past Medical History  Diagnosis Date  . Back pain   . UTI (urinary tract infection)   . Hypothyroid   . Diverticulitis   . Liver mass   . Rectal polyp     Past Surgical History  Procedure Laterality Date  . Abdominal hysterectomy    . Flexible sigmoidoscopy N/A 01/17/2013    Procedure: FLEXIBLE SIGMOIDOSCOPY;  Surgeon: Beverley Fiedler, MD;  Location: [facility] ENDOSCOPY;  Service: Gastroenterology;  Laterality: N/A;     Allergies: No Known Allergies  HPI:  Patient is a 79 yo who we are asked to see re increased AV block The patient has no prior cardiac history    She was admitted on 10/31 with fall, near syncope  The patient says that she got up from a chair and then just fell over.  Dnies dizziness  No SOB  NO Cp   She lives alone  Denies SOB     Inpatient Medications:  . cefTRIAXone (ROCEPHIN)  IV  1 g Intravenous Q24H  . levothyroxine  112 mcg Oral QAC breakfast  . multivitamin with minerals  1 tablet Oral Daily  . sodium chloride  3 mL Intravenous Q12H    Family History  Problem Relation Age of Onset  . Breast cancer Sister   . Diabetes Sister   . Heart disease Sister   . Heart disease Mother      History   Social History  . Marital Status: Widowed    Spouse Name: N/A    Number of Children: 1  . Years of Education: N/A   Occupational History  . Not on file.   Social History Main Topics  . Smoking status: Never Smoker   . Smokeless tobacco: Never Used  . Alcohol Use: No  . Drug Use: No  . Sexual Activity: Not on file   Other Topics Concern  . Not on file   Social History Narrative  . No narrative on file     Review of Systems: All other systems reviewed and are otherwise negative except as noted above.  Physical Exam: Filed Vitals:   08/30/13 1800   BP: 144/95  Pulse:   Temp:   Resp: 17    Intake/Output Summary (Last 24 hours) at 08/30/13 1901 Last data filed at 08/30/13 0843  Gross per 24 hour  Intake    240 ml  Output      0 ml  Net    240 ml   Tele:  SR, Advanced AV block and CHB  General: Well developed, well nourished, in no acute distress. Head: Normocephalic sclera non-icteric  Bruises to face Neck: Negative for carotid bruits. JVP not elevated. Lungs: Clear bilaterally to auscultation without wheezes, rales, or rhonchi. Breathing is unlabored. Heart: RRR with S1 S2. No murmurs, rubs, or gallops appreciated. Abdomen: Soft, non-tender, non-distended with normoactive bowel sounds. No hepatomegaly. No rebound/guarding. No obvious abdominal masses. Msk:  Strength and tone appears normal for age. Extremities: No clubbing, cyanosis or edema.  Distal pedal pulses are 2+ and equal bilaterally. Neuro: Alert and oriented X 3. Moves all extremities spontaneously. Psych:  Responds to questions appropriately with a normal affect.  Labs: No results found for this or any previous visit (from the past 24 hour(s)).  Radiology/Studies: Dg Chest 2 View  08/29/2013  CLINICAL DATA:  Syncope. Fall.  EXAM: CHEST  2 VIEW  COMPARISON:  02/22/2005  FINDINGS: Heart size is at the upper limits of normal. No evidence of pulmonary edema or other infiltrate. No evidence of pleural effusion. No mass or lymphadenopathy identified. Atherosclerotic calcification of the thoracic aorta again noted.  IMPRESSION: Borderline cardiomegaly. No active lung disease.   Electronically Signed   By: Myles Rosenthal M.D.   On: 08/29/2013 17:12   Ct Head Wo Contrast  08/29/2013   CLINICAL DATA:  Recent traumatic injury with pain  EXAM: CT HEAD WITHOUT CONTRAST  CT MAXILLOFACIAL WITHOUT CONTRAST  CT CERVICAL SPINE WITHOUT CONTRAST  TECHNIQUE: Multidetector CT imaging of the head, cervical spine, and maxillofacial structures were performed using the standard protocol  without intravenous contrast. Multiplanar CT image reconstructions of the cervical spine and maxillofacial structures were also generated.  COMPARISON:  None.  FINDINGS: CT HEAD FINDINGS  The bony calvarium is intact. No gross soft tissue abnormality is seen. Mild atrophic changes are seen. No findings to suggest acute hemorrhage, acute infarction or space-occupying mass lesion are noted.  CT MAXILLOFACIAL FINDINGS  The paranasal sinuses are well aerated. No mucosal changes are seen. A minimally displaced right nasal bone fracture is seen. No associated soft tissue abnormality is seen. The orbits and their contents are within normal limits. The facial bones are otherwise within normal limits.  CT CERVICAL SPINE FINDINGS  Seven cervical segments are well visualized. Vertebral body height is well maintained. Mild osteophytic changes are noted at C4-5 and C5-6 as well as C6-7. Mild anterolisthesis of C3 on C4 is noted of a degenerative nature. No acute fracture or acute facet abnormality is noted. The surrounding soft tissues and visualized lung apices are within normal limits. Some chronic calcifications are noted.  IMPRESSION: CT of the head: No acute abnormality is noted.  CT of the cervical spine: No evidence of acute abnormality. Mild degenerative changes are noted.  CT of the maxillofacial bones: Mildly displaced right nasal bone fracture.   Electronically Signed   By: Alcide Clever M.D.   On: 08/29/2013 17:49   Ct Cervical Spine Wo Contrast  08/29/2013   CLINICAL DATA:  Recent traumatic injury with pain  EXAM: CT HEAD WITHOUT CONTRAST  CT MAXILLOFACIAL WITHOUT CONTRAST  CT CERVICAL SPINE WITHOUT CONTRAST  TECHNIQUE: Multidetector CT imaging of the head, cervical spine, and maxillofacial structures were performed using the standard protocol without intravenous contrast. Multiplanar CT image reconstructions of the cervical spine and maxillofacial structures were also generated.  COMPARISON:  None.  FINDINGS: CT  HEAD FINDINGS  The bony calvarium is intact. No gross soft tissue abnormality is seen. Mild atrophic changes are seen. No findings to suggest acute hemorrhage, acute infarction or space-occupying mass lesion are noted.  CT MAXILLOFACIAL FINDINGS  The paranasal sinuses are well aerated. No mucosal changes are seen. A minimally displaced right nasal bone fracture is seen. No associated soft tissue abnormality is seen. The orbits and their contents are within normal limits. The facial bones are otherwise within normal limits.  CT CERVICAL SPINE FINDINGS  Seven cervical segments are well visualized. Vertebral body height is well maintained. Mild osteophytic changes are noted at C4-5 and C5-6 as well as C6-7. Mild anterolisthesis of C3 on C4 is noted of a degenerative nature. No acute fracture or acute facet abnormality is noted. The surrounding soft tissues and visualized lung apices are within normal limits. Some chronic calcifications are noted.  IMPRESSION: CT of the head:  No acute abnormality is noted.  CT of the cervical spine: No evidence of acute abnormality. Mild degenerative changes are noted.  CT of the maxillofacial bones: Mildly displaced right nasal bone fracture.   Electronically Signed   By: Alcide Clever M.D.   On: 08/29/2013 17:49   Ct Maxillofacial Wo Cm  08/29/2013   CLINICAL DATA:  Recent traumatic injury with pain  EXAM: CT HEAD WITHOUT CONTRAST  CT MAXILLOFACIAL WITHOUT CONTRAST  CT CERVICAL SPINE WITHOUT CONTRAST  TECHNIQUE: Multidetector CT imaging of the head, cervical spine, and maxillofacial structures were performed using the standard protocol without intravenous contrast. Multiplanar CT image reconstructions of the cervical spine and maxillofacial structures were also generated.  COMPARISON:  None.  FINDINGS: CT HEAD FINDINGS  The bony calvarium is intact. No gross soft tissue abnormality is seen. Mild atrophic changes are seen. No findings to suggest acute hemorrhage, acute infarction or  space-occupying mass lesion are noted.  CT MAXILLOFACIAL FINDINGS  The paranasal sinuses are well aerated. No mucosal changes are seen. A minimally displaced right nasal bone fracture is seen. No associated soft tissue abnormality is seen. The orbits and their contents are within normal limits. The facial bones are otherwise within normal limits.  CT CERVICAL SPINE FINDINGS  Seven cervical segments are well visualized. Vertebral body height is well maintained. Mild osteophytic changes are noted at C4-5 and C5-6 as well as C6-7. Mild anterolisthesis of C3 on C4 is noted of a degenerative nature. No acute fracture or acute facet abnormality is noted. The surrounding soft tissues and visualized lung apices are within normal limits. Some chronic calcifications are noted.  IMPRESSION: CT of the head: No acute abnormality is noted.  CT of the cervical spine: No evidence of acute abnormality. Mild degenerative changes are noted.  CT of the maxillofacial bones: Mildly displaced right nasal bone fracture.   Electronically Signed   By: Alcide Clever M.D.   On: 08/29/2013 17:49   LABs:  TSH .7  EKG: SR 79 bpm  RBBB.  LAFB  LVH  ASSESSMENT AND PLAN:  Patient is an 79 yo with near syncope  Tele with evid of high grade conduction disease  Evidence to support PPM implant.  Her spell prior to admit is highly suspicous for symptomatic bradycardia  Discussed this with patient   She would like to think about it  She is not sure she wants or needs a PPM   Reviewed cardiac electrophys principles with pt. For now she does not want anything done  Rec:  Patient should not get out of bed unattended.  Continue telemetry  No evid for need for temp pacing.   Discussed with son Brett Canales)     Signed, Dietrich Pates 08/30/2013, 7:01 PM

## 2013-08-30 NOTE — Progress Notes (Signed)
  Echocardiogram 2D Echocardiogram has been performed.  [name] FRANCES 08/30/2013, 10:02 AM

## 2013-08-30 NOTE — Progress Notes (Signed)
[facility] PROGRESS NOTE  Zalayah Pizzuto [facility] ZOX:[id number] DOB: 1928/03/09 DOA: 08/29/2013 PCP: Rudi Heap, MD  HPI/Brief narrative 79 year old female, active and independent, history of hypothyroidism, UTI, admitted on 08/29/13 with symptoms suggestive of near syncope, fall and right nasal bone fracture. She denied prior cardiac history. In the ED, EKG revealed RBBB AND LAFB, CT head and neck were negative for acute findings. CT of the maxillofacial area showed mildly displaced right nasal bone fracture. UA positive for many bacteria but not significant leukocytes. Hospitalist admission requested. No further episodes since admission. Telemetry however shows couple of episodes of 2-5 nonconducted P waves suspicious for high degree heart block. Cardiology has been consulted.  Assessment/Plan:  Near syncope & fall  - ? Secondary to heart block - Continue monitoring on telemetry - Negative orthostatic blood pressures - 2-D echo: Mild to moderate LVH, LVEF 60-65%, mild to moderate AR,? Ruptured chordae tendineae & mild to moderate MR - Carotid Dopplers: 1-39% ICA stenosis bilaterally. - Cardiology consulted  Heart block - Continue monitoring on telemetry - Cardiology consulted.   Possible UTI  - Continue IV Rocephin pending urine culture results.   Hypothyroidism  - Continue Synthroid   Mildly displaced right nasal bone fracture  - Probably conservative management.    DVT prophylaxis: SCD Lines/catheters: PIV Nutrition: Regular  Activity:  Up with assistance Code Status: Full Family Communication: Patient declines Disposition Plan: To be determined   Consultants:  Cardiology  Procedures:  None  Antibiotics:  IV rocephin 11/1 >   Subjective: Denies complaints.  Objective: Filed Vitals:   08/30/13 1116 08/30/13 1119 08/30/13 1122 08/30/13 1125  BP: 132/53 131/54 136/65 147/67  Pulse: 57 66 82 90  Temp: 97.7 F (36.5 C)     TempSrc: Oral     Resp: 20      Height:      Weight:      SpO2: 100%       Intake/Output Summary (Last 24 hours) at 08/30/13 1159 Last data filed at 08/30/13 0843  Gross per 24 hour  Intake    240 ml  Output      0 ml  Net    240 ml   Filed Weights   08/29/13 2134  Weight: 66.906 kg (147 lb 8 oz)     Exam:  General exam: Pleasant elderly female sitting up at the edge of bed. In no distress. Respiratory system: Clear. No increased work of breathing. Cardiovascular system: S1 & S2 heard, RRR. No JVD, murmurs, gallops, clicks or pedal edema. Telemetry: Mostly sinus rhythm. Couple of episodes of 2-5 non-conducted P waves. Gastrointestinal system: Abdomen is nondistended, soft and nontender. Normal bowel sounds heard. Central nervous system: Alert and oriented. No focal neurological deficits. Extremities: Symmetric 5 x 5 power. ENT: Bilateral infraorbital and nasal bridge bruising.   Data Reviewed: Basic Metabolic Panel:  Recent Labs Lab 08/29/13 1558  NA 141  K 4.1  CL 103  CO2 26  GLUCOSE 104*  BUN 15  CREATININE 0.83  CALCIUM 10.7*   Liver Function Tests: No results found for this basename: AST, ALT, ALKPHOS, BILITOT, PROT, ALBUMIN,  in the last 168 hours No results found for this basename: LIPASE, AMYLASE,  in the last 168 hours No results found for this basename: AMMONIA,  in the last 168 hours CBC:  Recent Labs Lab 08/29/13 1558  WBC 10.1  NEUTROABS 7.8*  HGB 14.7  HCT 43.3  MCV 97.7  PLT 306   Cardiac Enzymes:  Recent  Labs Lab 08/29/13 1558  TROPONINI <0.30   BNP (last 3 results) No results found for this basename: PROBNP,  in the last 8760 hours CBG: No results found for this basename: GLUCAP,  in the last 168 hours  No results found for this or any previous visit (from the past 240 hour(s)).    Additional labs: 1. None     Studies: Dg Chest 2 View  08/29/2013   CLINICAL DATA:  Syncope. Fall.  EXAM: CHEST  2 VIEW  COMPARISON:  02/22/2005  FINDINGS: Heart  size is at the upper limits of normal. No evidence of pulmonary edema or other infiltrate. No evidence of pleural effusion. No mass or lymphadenopathy identified. Atherosclerotic calcification of the thoracic aorta again noted.  IMPRESSION: Borderline cardiomegaly. No active lung disease.   Electronically Signed   By: Myles Rosenthal M.D.   On: 08/29/2013 17:12   Ct Head Wo Contrast  08/29/2013   CLINICAL DATA:  Recent traumatic injury with pain  EXAM: CT HEAD WITHOUT CONTRAST  CT MAXILLOFACIAL WITHOUT CONTRAST  CT CERVICAL SPINE WITHOUT CONTRAST  TECHNIQUE: Multidetector CT imaging of the head, cervical spine, and maxillofacial structures were performed using the standard protocol without intravenous contrast. Multiplanar CT image reconstructions of the cervical spine and maxillofacial structures were also generated.  COMPARISON:  None.  FINDINGS: CT HEAD FINDINGS  The bony calvarium is intact. No gross soft tissue abnormality is seen. Mild atrophic changes are seen. No findings to suggest acute hemorrhage, acute infarction or space-occupying mass lesion are noted.  CT MAXILLOFACIAL FINDINGS  The paranasal sinuses are well aerated. No mucosal changes are seen. A minimally displaced right nasal bone fracture is seen. No associated soft tissue abnormality is seen. The orbits and their contents are within normal limits. The facial bones are otherwise within normal limits.  CT CERVICAL SPINE FINDINGS  Seven cervical segments are well visualized. Vertebral body height is well maintained. Mild osteophytic changes are noted at C4-5 and C5-6 as well as C6-7. Mild anterolisthesis of C3 on C4 is noted of a degenerative nature. No acute fracture or acute facet abnormality is noted. The surrounding soft tissues and visualized lung apices are within normal limits. Some chronic calcifications are noted.  IMPRESSION: CT of the head: No acute abnormality is noted.  CT of the cervical spine: No evidence of acute abnormality. Mild  degenerative changes are noted.  CT of the maxillofacial bones: Mildly displaced right nasal bone fracture.   Electronically Signed   By: Alcide Clever M.D.   On: 08/29/2013 17:49   Ct Cervical Spine Wo Contrast  08/29/2013   CLINICAL DATA:  Recent traumatic injury with pain  EXAM: CT HEAD WITHOUT CONTRAST  CT MAXILLOFACIAL WITHOUT CONTRAST  CT CERVICAL SPINE WITHOUT CONTRAST  TECHNIQUE: Multidetector CT imaging of the head, cervical spine, and maxillofacial structures were performed using the standard protocol without intravenous contrast. Multiplanar CT image reconstructions of the cervical spine and maxillofacial structures were also generated.  COMPARISON:  None.  FINDINGS: CT HEAD FINDINGS  The bony calvarium is intact. No gross soft tissue abnormality is seen. Mild atrophic changes are seen. No findings to suggest acute hemorrhage, acute infarction or space-occupying mass lesion are noted.  CT MAXILLOFACIAL FINDINGS  The paranasal sinuses are well aerated. No mucosal changes are seen. A minimally displaced right nasal bone fracture is seen. No associated soft tissue abnormality is seen. The orbits and their contents are within normal limits. The facial bones are otherwise within normal limits.  CT CERVICAL SPINE FINDINGS  Seven cervical segments are well visualized. Vertebral body height is well maintained. Mild osteophytic changes are noted at C4-5 and C5-6 as well as C6-7. Mild anterolisthesis of C3 on C4 is noted of a degenerative nature. No acute fracture or acute facet abnormality is noted. The surrounding soft tissues and visualized lung apices are within normal limits. Some chronic calcifications are noted.  IMPRESSION: CT of the head: No acute abnormality is noted.  CT of the cervical spine: No evidence of acute abnormality. Mild degenerative changes are noted.  CT of the maxillofacial bones: Mildly displaced right nasal bone fracture.   Electronically Signed   By: Alcide Clever M.D.   On: 08/29/2013  17:49   Ct Maxillofacial Wo Cm  08/29/2013   CLINICAL DATA:  Recent traumatic injury with pain  EXAM: CT HEAD WITHOUT CONTRAST  CT MAXILLOFACIAL WITHOUT CONTRAST  CT CERVICAL SPINE WITHOUT CONTRAST  TECHNIQUE: Multidetector CT imaging of the head, cervical spine, and maxillofacial structures were performed using the standard protocol without intravenous contrast. Multiplanar CT image reconstructions of the cervical spine and maxillofacial structures were also generated.  COMPARISON:  None.  FINDINGS: CT HEAD FINDINGS  The bony calvarium is intact. No gross soft tissue abnormality is seen. Mild atrophic changes are seen. No findings to suggest acute hemorrhage, acute infarction or space-occupying mass lesion are noted.  CT MAXILLOFACIAL FINDINGS  The paranasal sinuses are well aerated. No mucosal changes are seen. A minimally displaced right nasal bone fracture is seen. No associated soft tissue abnormality is seen. The orbits and their contents are within normal limits. The facial bones are otherwise within normal limits.  CT CERVICAL SPINE FINDINGS  Seven cervical segments are well visualized. Vertebral body height is well maintained. Mild osteophytic changes are noted at C4-5 and C5-6 as well as C6-7. Mild anterolisthesis of C3 on C4 is noted of a degenerative nature. No acute fracture or acute facet abnormality is noted. The surrounding soft tissues and visualized lung apices are within normal limits. Some chronic calcifications are noted.  IMPRESSION: CT of the head: No acute abnormality is noted.  CT of the cervical spine: No evidence of acute abnormality. Mild degenerative changes are noted.  CT of the maxillofacial bones: Mildly displaced right nasal bone fracture.   Electronically Signed   By: Alcide Clever M.D.   On: 08/29/2013 17:49        Scheduled Meds: . cefTRIAXone (ROCEPHIN)  IV  1 g Intravenous Q24H  . levothyroxine  112 mcg Oral QAC breakfast  . multivitamin with minerals  1 tablet Oral  Daily  . sodium chloride  3 mL Intravenous Q12H   Continuous Infusions:   Principal Problem:   Near syncope Active Problems:   Hypothyroidism   Fall at home, with facial injury   UTI (lower urinary tract infection)    Time spent: 40 minutes.    Marcellus Scott, MD, FACP, FHM. [facility] Pager [phone]  If 7PM-7AM, please contact night-coverage [email or web address] Password [email or web address] 08/30/2013, 11:59 AM    LOS: 1 day

## 2013-08-30 NOTE — Progress Notes (Signed)
Patient was initially being monitored on neuro telemetry floor. She started having increased number of heart blocks on monitor, however she remained hemodynamically stable. She was then transferred to cardiac telemetry for close monitoring. Discussed with cardiology who advised that patient is not willing to follow bedrest instructions and she is refusing pacemaker. They advised transferring her to step down unit for closer monitoring. We believe that she is competent but is making poor judgment at this time. Cardiology has discussed her care with her son who is going to speak with patient to try and convince her to get the pacemaker. If patient refuses and wishes to leave, she will have to leave AGAINST MEDICAL ADVICE. She has been advised regarding dangerous consequences including rapid decline and death. She verbalizes understanding.   Marcellus Scott, MD, FACP, FHM. [facility] Pager [phone]  If 7PM-7AM, please contact night-coverage [email or web address] Password [email or web address] 08/30/2013, 4:06 PM

## 2013-08-30 NOTE — Progress Notes (Signed)
Notified by Tele monitors of pt in 2 degree heart block II with 5 missed beat episode. Pt asymptomatic and Dr. Al Corpus notified. Orders to transfer to tele cardiac unit. Barbera Setters RN

## 2013-08-30 NOTE — Progress Notes (Signed)
Pt transferred to 2900. Pt apparently warming up to having a pacemaker.

## 2013-08-30 NOTE — Progress Notes (Signed)
RN called to room and pt requested all side rails up. Pt stated she was afraid that she would fall out of bed during the night. RN explained that having all side rails up would be considered a restraint. Pt stated that she did not understand how having the rails up would restrain her. Pt stated she would not sleep in this bed if all four rails were not up; she would sleep on the pull out couch. Pt was admitted for syncope and needs to remain in bed with bed alarm for safety. Elray Mcgregor, NP made aware of pt's request, nsg order placed. All side rails and bed alarm placed on pt. Pt is alert and orientedx4. She understands that she is supposed to call before trying to get out of bed. Salvadore Oxford, RN [phone] 08/30/13

## 2013-08-30 NOTE — Progress Notes (Signed)
VASCULAR LAB PRELIMINARY  PRELIMINARY  PRELIMINARY  PRELIMINARY  Carotid Dopplers completed.    Preliminary report:  There is 1-39% ICA stenosis.  Vertebral artery flow is antegrade.  Jaysion Ramseyer, RVT 08/30/2013, 11:03 AM

## 2013-08-30 NOTE — ED Provider Notes (Signed)
Medical screening examination/treatment/procedure(s) were conducted as a shared visit with resident physician and myself.  I personally evaluated the patient during the encounter. I personally reviewed and interpreted the ecg and agree with the residents interpretation.   I interviewed and examined the patient. Lungs are CTAB. New systolic murmur heard . Abdomen soft. Will admit for syncope.    Junius Argyle, MD 08/30/13 1147

## 2013-08-31 DIAGNOSIS — R55 Syncope and collapse: Secondary | ICD-10-CM | POA: Diagnosis not present

## 2013-08-31 DIAGNOSIS — I459 Conduction disorder, unspecified: Secondary | ICD-10-CM | POA: Diagnosis not present

## 2013-08-31 DIAGNOSIS — I442 Atrioventricular block, complete: Secondary | ICD-10-CM

## 2013-08-31 DIAGNOSIS — I4430 Unspecified atrioventricular block: Secondary | ICD-10-CM | POA: Diagnosis not present

## 2013-08-31 DIAGNOSIS — E039 Hypothyroidism, unspecified: Secondary | ICD-10-CM | POA: Diagnosis not present

## 2013-08-31 DIAGNOSIS — N390 Urinary tract infection, site not specified: Secondary | ICD-10-CM | POA: Diagnosis not present

## 2013-08-31 HISTORY — DX: Atrioventricular block, complete: I44.2

## 2013-08-31 HISTORY — DX: Conduction disorder, unspecified: I45.9

## 2013-08-31 NOTE — Progress Notes (Signed)
PT Cancellation Note  Patient Details Name: [name] MRN: [id number] DOB: 1928-03-16   Cancelled Treatment:    Reason Eval/Treat Not Completed: Other (comment).  Pt on bedrest, MD paged to updated orders.  PT to check back later to see if pt off of bedrest.   Thanks,   Lurena Joiner B. Shann Lewellyn, PT, DPT [phone]   08/31/2013, 8:58 AM

## 2013-08-31 NOTE — Progress Notes (Signed)
[facility] PROGRESS NOTE  [name] [facility] WUJ:[id number] DOB: 1928/10/25 DOA: 08/29/2013 PCP: Rudi Heap, MD  HPI/Brief narrative 79 year old female, active and independent, history of hypothyroidism, UTI, admitted on 08/29/13 with symptoms suggestive of near syncope, fall and right nasal bone fracture. She denied prior cardiac history. In the ED, EKG revealed RBBB AND LAFB, CT head and neck were negative for acute findings. CT of the maxillofacial area showed mildly displaced right nasal bone fracture. UA positive for many bacteria but not significant leukocytes. Hospitalist admission requested. No further episodes since admission. Telemetry however shows couple of episodes of 2-5 nonconducted P waves suspicious for high degree heart block. Cardiology has been consulted. Patient was transferred to the step down unit for close monitoring.   Assessment/Plan:  Near syncope & fall  - Possibly related to heart block and bradycardia - Continue monitoring on telemetry - Negative orthostatic blood pressures - 2-D echo: Mild to moderate LVH, LVEF 60-65%, mild to moderate AR,? Ruptured chordae tendineae & mild to moderate MR - Carotid Dopplers: 1-39% ICA stenosis bilaterally. - Cardiology input appreciated. Cardiology recommends PPM which patient seemed agreeable last evening but today states that due to MRSA +, she is not sure. Await cardiology followup to see if they can convince her to get it done.   High-grade AV block - Continue monitoring on telemetry - Cardiology input appreciated-recommend PPM which patient is undecided yet. - Bedrest: Patient will not strictly follow despite multiple advises   Possible UTI  - Continue IV Rocephin 3/3 days. Unfortunately no urine cultures were sent.  Hypothyroidism  - Continue Synthroid   Mildly displaced right nasal bone fracture  - Probably conservative management.   MRSA +   DVT prophylaxis: SCD Lines/catheters: PIV Nutrition:  Regular  Activity:  Bedrest Code Status: Full Family Communication: Patient declines offer to speak with family. Cardiology did speak with patient's son yesterday. Disposition Plan: To be determined. Remains in step down unit.   Consultants:  Cardiology  Procedures:  None  Antibiotics:  IV rocephin 11/1 >   Subjective: Denies complaints. No dizziness, lightheadedness, chest pain or dyspnea.  Objective: Filed Vitals:   08/31/13 0400 08/31/13 0700 08/31/13 0800 08/31/13 0900  BP:  145/61 142/71 135/52  Pulse:      Temp: 97.4 F (36.3 C)  98.5 F (36.9 C)   TempSrc: Oral  Oral   Resp:  20 15 22   Height:      Weight:      SpO2:        Intake/Output Summary (Last 24 hours) at 08/31/13 1053 Last data filed at 08/31/13 9562  Gross per 24 hour  Intake    273 ml  Output    150 ml  Net    123 ml   Filed Weights   08/29/13 2134 08/31/13 0300  Weight: 66.906 kg (147 lb 8 oz) 65.6 kg (144 lb 10 oz)     Exam:  General exam: Pleasant elderly female sitting up at the edge of bed. In no distress. Respiratory system: Clear. No increased work of breathing. Cardiovascular system: S1 & S2 heard, RRR. No JVD, murmurs, gallops, clicks or pedal edema. Telemetry: Mostly sinus rhythm. Periodic nonconducted P waves and bradycardia in the 40s. Gastrointestinal system: Abdomen is nondistended, soft and nontender. Normal bowel sounds heard. Central nervous system: Alert and oriented. No focal neurological deficits. Extremities: Symmetric 5 x 5 power. ENT: Bilateral infraorbital and nasal bridge bruising-seems better..   Data Reviewed: Basic Metabolic Panel:  Recent Labs  Lab 08/29/13 1558  NA 141  K 4.1  CL 103  CO2 26  GLUCOSE 104*  BUN 15  CREATININE 0.83  CALCIUM 10.7*   Liver Function Tests: No results found for this basename: AST, ALT, ALKPHOS, BILITOT, PROT, ALBUMIN,  in the last 168 hours No results found for this basename: LIPASE, AMYLASE,  in the last 168  hours No results found for this basename: AMMONIA,  in the last 168 hours CBC:  Recent Labs Lab 08/29/13 1558  WBC 10.1  NEUTROABS 7.8*  HGB 14.7  HCT 43.3  MCV 97.7  PLT 306   Cardiac Enzymes:  Recent Labs Lab 08/29/13 1558  TROPONINI <0.30   BNP (last 3 results) No results found for this basename: PROBNP,  in the last 8760 hours CBG: No results found for this basename: GLUCAP,  in the last 168 hours  Recent Results (from the past 240 hour(s))  MRSA PCR SCREENING     Status: Abnormal   Collection Time    08/30/13  5:28 PM      Result Value Range Status   MRSA by PCR POSITIVE (*) NEGATIVE Final   Comment:            The GeneXpert MRSA Assay (FDA     approved for NASAL specimens     only), is one component of a     comprehensive MRSA colonization     surveillance program. It is not     intended to diagnose MRSA     infection nor to guide or     monitor treatment for     MRSA infections.     RESULT CALLED TO, READ BACK BY AND VERIFIED WITH:     TRACY GREGORY 1905 08/30/13 BY WOOLLENK      Additional labs: 1. TSH: 0.719, total T4: 10, free Thyroxine index: 3.3 and T3 uptake ratio: 33 2. Carotid Dopplers: Summary: Right: mild mixed plaque origin ICA and ECA. Left: moderate soft plaque distal CCA and origin and proximal ICA. Bilateral: 1-39% ICA stenosis. Vertebral artery flow is antegrade. ICA/CCA ratio: R- 1.59 L-1.57       Studies: Dg Chest 2 View  08/29/2013   CLINICAL DATA:  Syncope. Fall.  EXAM: CHEST  2 VIEW  COMPARISON:  02/22/2005  FINDINGS: Heart size is at the upper limits of normal. No evidence of pulmonary edema or other infiltrate. No evidence of pleural effusion. No mass or lymphadenopathy identified. Atherosclerotic calcification of the thoracic aorta again noted.  IMPRESSION: Borderline cardiomegaly. No active lung disease.   Electronically Signed   By: Myles Rosenthal M.D.   On: 08/29/2013 17:12   Ct Head Wo Contrast  08/29/2013   CLINICAL  DATA:  Recent traumatic injury with pain  EXAM: CT HEAD WITHOUT CONTRAST  CT MAXILLOFACIAL WITHOUT CONTRAST  CT CERVICAL SPINE WITHOUT CONTRAST  TECHNIQUE: Multidetector CT imaging of the head, cervical spine, and maxillofacial structures were performed using the standard protocol without intravenous contrast. Multiplanar CT image reconstructions of the cervical spine and maxillofacial structures were also generated.  COMPARISON:  None.  FINDINGS: CT HEAD FINDINGS  The bony calvarium is intact. No gross soft tissue abnormality is seen. Mild atrophic changes are seen. No findings to suggest acute hemorrhage, acute infarction or space-occupying mass lesion are noted.  CT MAXILLOFACIAL FINDINGS  The paranasal sinuses are well aerated. No mucosal changes are seen. A minimally displaced right nasal bone fracture is seen. No associated soft tissue abnormality is seen. The orbits and  their contents are within normal limits. The facial bones are otherwise within normal limits.  CT CERVICAL SPINE FINDINGS  Seven cervical segments are well visualized. Vertebral body height is well maintained. Mild osteophytic changes are noted at C4-5 and C5-6 as well as C6-7. Mild anterolisthesis of C3 on C4 is noted of a degenerative nature. No acute fracture or acute facet abnormality is noted. The surrounding soft tissues and visualized lung apices are within normal limits. Some chronic calcifications are noted.  IMPRESSION: CT of the head: No acute abnormality is noted.  CT of the cervical spine: No evidence of acute abnormality. Mild degenerative changes are noted.  CT of the maxillofacial bones: Mildly displaced right nasal bone fracture.   Electronically Signed   By: Alcide Clever M.D.   On: 08/29/2013 17:49   Ct Cervical Spine Wo Contrast  08/29/2013   CLINICAL DATA:  Recent traumatic injury with pain  EXAM: CT HEAD WITHOUT CONTRAST  CT MAXILLOFACIAL WITHOUT CONTRAST  CT CERVICAL SPINE WITHOUT CONTRAST  TECHNIQUE: Multidetector CT  imaging of the head, cervical spine, and maxillofacial structures were performed using the standard protocol without intravenous contrast. Multiplanar CT image reconstructions of the cervical spine and maxillofacial structures were also generated.  COMPARISON:  None.  FINDINGS: CT HEAD FINDINGS  The bony calvarium is intact. No gross soft tissue abnormality is seen. Mild atrophic changes are seen. No findings to suggest acute hemorrhage, acute infarction or space-occupying mass lesion are noted.  CT MAXILLOFACIAL FINDINGS  The paranasal sinuses are well aerated. No mucosal changes are seen. A minimally displaced right nasal bone fracture is seen. No associated soft tissue abnormality is seen. The orbits and their contents are within normal limits. The facial bones are otherwise within normal limits.  CT CERVICAL SPINE FINDINGS  Seven cervical segments are well visualized. Vertebral body height is well maintained. Mild osteophytic changes are noted at C4-5 and C5-6 as well as C6-7. Mild anterolisthesis of C3 on C4 is noted of a degenerative nature. No acute fracture or acute facet abnormality is noted. The surrounding soft tissues and visualized lung apices are within normal limits. Some chronic calcifications are noted.  IMPRESSION: CT of the head: No acute abnormality is noted.  CT of the cervical spine: No evidence of acute abnormality. Mild degenerative changes are noted.  CT of the maxillofacial bones: Mildly displaced right nasal bone fracture.   Electronically Signed   By: Alcide Clever M.D.   On: 08/29/2013 17:49   Ct Maxillofacial Wo Cm  08/29/2013   CLINICAL DATA:  Recent traumatic injury with pain  EXAM: CT HEAD WITHOUT CONTRAST  CT MAXILLOFACIAL WITHOUT CONTRAST  CT CERVICAL SPINE WITHOUT CONTRAST  TECHNIQUE: Multidetector CT imaging of the head, cervical spine, and maxillofacial structures were performed using the standard protocol without intravenous contrast. Multiplanar CT image reconstructions of  the cervical spine and maxillofacial structures were also generated.  COMPARISON:  None.  FINDINGS: CT HEAD FINDINGS  The bony calvarium is intact. No gross soft tissue abnormality is seen. Mild atrophic changes are seen. No findings to suggest acute hemorrhage, acute infarction or space-occupying mass lesion are noted.  CT MAXILLOFACIAL FINDINGS  The paranasal sinuses are well aerated. No mucosal changes are seen. A minimally displaced right nasal bone fracture is seen. No associated soft tissue abnormality is seen. The orbits and their contents are within normal limits. The facial bones are otherwise within normal limits.  CT CERVICAL SPINE FINDINGS  Seven cervical segments are well visualized. Vertebral  body height is well maintained. Mild osteophytic changes are noted at C4-5 and C5-6 as well as C6-7. Mild anterolisthesis of C3 on C4 is noted of a degenerative nature. No acute fracture or acute facet abnormality is noted. The surrounding soft tissues and visualized lung apices are within normal limits. Some chronic calcifications are noted.  IMPRESSION: CT of the head: No acute abnormality is noted.  CT of the cervical spine: No evidence of acute abnormality. Mild degenerative changes are noted.  CT of the maxillofacial bones: Mildly displaced right nasal bone fracture.   Electronically Signed   By: Alcide Clever M.D.   On: 08/29/2013 17:49        Scheduled Meds: . cefTRIAXone (ROCEPHIN)  IV  1 g Intravenous Q24H  . Chlorhexidine Gluconate Cloth  6 each Topical Q0600  . levothyroxine  112 mcg Oral QAC breakfast  . multivitamin with minerals  1 tablet Oral Daily  . mupirocin ointment  1 application Nasal BID  . sodium chloride  3 mL Intravenous Q12H   Continuous Infusions:   Principal Problem:   Near syncope Active Problems:   Hypothyroidism   Fall at home, with facial injury   UTI (lower urinary tract infection)    Time spent: 25 minutes.    Marcellus Scott, MD, FACP, FHM. [facility] Pager [phone]  If 7PM-7AM, please contact night-coverage [email or web address] Password [email or web address] 08/31/2013, 10:53 AM    LOS: 2 days

## 2013-08-31 NOTE — Progress Notes (Signed)
Patient ID: [name], female   DOB: January 10, 1928, 79 y.o.   MRN: [id number]    SUBJECTIVE:  I reviewed the record and sat down to speak with the patient. She is adamant that she does not want a pacemaker. Unfortunately she seems to think we are trying to talk her into something. I explained that we are only trying to help. At this point she is not planning for a pacemaker. We will follow an a distance.  Jerral Bonito, MD

## 2013-08-31 NOTE — Progress Notes (Signed)
PT Cancellation Note  Patient Details Name: [name] MRN: [id number] DOB: 1928/10/17   Cancelled Treatment:    Reason Eval/Treat Not Completed: Other (comment).  Per RN- the pt will be on bedrest until after she gets the pacemaker, the issue is, she is not agreeable at this time for pacemaker placement.  PT to sign off.  Please re-order post procedure.    Thanks,   Rollene Rotunda. Clinton Wahlberg, PT, DPT [phone]   08/31/2013, 10:52 AM

## 2013-09-01 ENCOUNTER — Encounter (HOSPITAL_COMMUNITY)
Admission: EM | Disposition: A | Discharge status: Home / Self Care | Payer: Self-pay | Source: Home / Self Care | Attending: Internal Medicine

## 2013-09-01 DIAGNOSIS — I442 Atrioventricular block, complete: Secondary | ICD-10-CM | POA: Diagnosis not present

## 2013-09-01 DIAGNOSIS — N390 Urinary tract infection, site not specified: Secondary | ICD-10-CM | POA: Diagnosis not present

## 2013-09-01 DIAGNOSIS — I4430 Unspecified atrioventricular block: Secondary | ICD-10-CM | POA: Diagnosis not present

## 2013-09-01 DIAGNOSIS — E039 Hypothyroidism, unspecified: Secondary | ICD-10-CM | POA: Diagnosis not present

## 2013-09-01 DIAGNOSIS — R55 Syncope and collapse: Secondary | ICD-10-CM | POA: Diagnosis not present

## 2013-09-01 HISTORY — PX: PERMANENT PACEMAKER INSERTION: SHX5480

## 2013-09-01 HISTORY — PX: PACEMAKER PLACEMENT: SHX43

## 2013-09-01 SURGERY — PERMANENT PACEMAKER INSERTION
Anesthesia: LOCAL

## 2013-09-01 MED ORDER — LIDOCAINE HCL (PF) 1 % IJ SOLN
INTRAMUSCULAR | Status: AC
  Filled 2013-09-01: qty 30

## 2013-09-01 MED ORDER — MIDAZOLAM HCL 5 MG/5ML IJ SOLN
INTRAMUSCULAR | Status: AC
  Filled 2013-09-01: qty 5

## 2013-09-01 MED ORDER — ONDANSETRON HCL 4 MG/2ML IJ SOLN: 4.0000 mg | Freq: Four times a day (QID) | INTRAMUSCULAR | Status: DC | PRN

## 2013-09-01 MED ORDER — SODIUM CHLORIDE 0.9 % IV SOLN
INTRAVENOUS | Status: DC
  Administered 2013-09-01: 50 mL/h via INTRAVENOUS

## 2013-09-01 MED ORDER — HEPARIN (PORCINE) IN NACL 2-0.9 UNIT/ML-% IJ SOLN
INTRAMUSCULAR | Status: AC
  Filled 2013-09-01: qty 500

## 2013-09-01 MED ORDER — CHLORHEXIDINE GLUCONATE CLOTH 2 % EX PADS
6.0000 | MEDICATED_PAD | Freq: Every day | CUTANEOUS | Status: DC
  Administered 2013-09-02: 05:00:00 6 via TOPICAL

## 2013-09-01 MED ORDER — SODIUM CHLORIDE 0.9 % IR SOLN
80.0000 mg | Status: DC
  Filled 2013-09-01: qty 2

## 2013-09-01 MED ORDER — YOU HAVE A PACEMAKER BOOK
Freq: Once | Status: AC
  Administered 2013-09-01: 20:00:00
  Filled 2013-09-01: qty 1

## 2013-09-01 MED ORDER — ACETAMINOPHEN 325 MG PO TABS
325.0000 mg | ORAL_TABLET | ORAL | Status: DC | PRN
  Administered 2013-09-01 – 2013-09-02 (×2): 650 mg via ORAL
  Administered 2013-09-02: 07:00:00 325 mg via ORAL
  Filled 2013-09-01 (×4): qty 2

## 2013-09-01 MED ORDER — CEFAZOLIN SODIUM 1-5 GM-% IV SOLN
1.0000 g | Freq: Four times a day (QID) | INTRAVENOUS | Status: AC
  Administered 2013-09-01 – 2013-09-02 (×3): 1 g via INTRAVENOUS
  Filled 2013-09-01 (×4): qty 50

## 2013-09-01 MED ORDER — CEFAZOLIN SODIUM-DEXTROSE 2-3 GM-% IV SOLR
2.0000 g | INTRAVENOUS | Status: DC
  Filled 2013-09-01: qty 50

## 2013-09-01 MED ORDER — MUPIROCIN 2 % EX OINT: 1.0000 "application " | TOPICAL_OINTMENT | Freq: Two times a day (BID) | CUTANEOUS | Status: DC

## 2013-09-01 MED ORDER — FENTANYL CITRATE 0.05 MG/ML IJ SOLN
INTRAMUSCULAR | Status: AC
  Filled 2013-09-01: qty 2

## 2013-09-01 NOTE — Progress Notes (Signed)
[facility] PROGRESS NOTE  [name] [facility] ZOX:[id number] DOB: [birth year] DOA: 08/29/2013 PCP: Rudi Heap, MD  HPI/Brief narrative 79 year old female, active and independent, history of hypothyroidism, UTI, admitted on 08/29/13 with symptoms suggestive of near syncope, fall and right nasal bone fracture. She denied prior cardiac history. In the ED, EKG revealed RBBB AND LAFB, CT head and neck were negative for acute findings. CT of the maxillofacial area showed mildly displaced right nasal bone fracture. UA positive for many bacteria but not significant leukocytes. Hospitalist admission requested. No further episodes since admission. Telemetry however shows couple of episodes of 2-5 nonconducted P waves suspicious for high degree heart block. Cardiology has been consulted. Patient was transferred to the step down unit for close monitoring.   Assessment/Plan:  Near syncope & fall  - Possibly related to heart block and bradycardia - Continue monitoring on telemetry - Negative orthostatic blood pressures - 2-D echo: Mild to moderate LVH, LVEF 60-65%, mild to moderate AR,? Ruptured chordae tendineae & mild to moderate MR - Carotid Dopplers: 1-39% ICA stenosis bilaterally. - Patient has agreed for PPM placement. As per cardiology, PPM at 3 PM on 09/01/13.   High-grade AV block - Continue monitoring on telemetry - Cardiology input appreciated - Bedrest: Patient will not strictly follow despite multiple advises - Pacemaker to be placed this afternoon.   Possible UTI  - Continue IV Rocephin 3/3 days. Unfortunately no urine cultures were sent. - Completed treatment.  Hypothyroidism  - Continue Synthroid   Mildly displaced right nasal bone fracture  - Probably conservative management.   MRSA +   DVT prophylaxis: SCD Lines/catheters: PIV Nutrition: Regular  Activity:  Bedrest Code Status: Full Family Communication: Patient declines offer to speak with family.  Disposition  Plan: Possible discharge home 11/4.   Consultants:  Cardiology  Procedures:  None  Antibiotics:  IV rocephin 10/31 > 11/2   Subjective: Denies complaints.  Objective: Filed Vitals:   09/01/13 0600 09/01/13 0807 09/01/13 0824 09/01/13 1202  BP:  132/57 132/57   Pulse:   84   Temp:   97.5 F (36.4 C)   TempSrc:   Oral   Resp: 18 13 14    Height:      Weight:      SpO2:  98% 98% 98%    Intake/Output Summary (Last 24 hours) at 09/01/13 1252 Last data filed at 09/01/13 0120  Gross per 24 hour  Intake    393 ml  Output    650 ml  Net   -257 ml   Filed Weights   08/29/13 2134 08/31/13 0300  Weight: 66.906 kg (147 lb 8 oz) 65.6 kg (144 lb 10 oz)     Exam:  General exam: Pleasant elderly female sitting up at the edge of bed. In no distress. Respiratory system: Clear. No increased work of breathing. Cardiovascular system: S1 & S2 heard, RRR. No JVD, murmurs, gallops, clicks or pedal edema. Telemetry: Mostly sinus rhythm. Did not see further evidence of heart block since yesterday. Gastrointestinal system: Abdomen is nondistended, soft and nontender. Normal bowel sounds heard. Central nervous system: Alert and oriented. No focal neurological deficits. Extremities: Symmetric 5 x 5 power. ENT: Bilateral infraorbital and nasal bridge bruising-seems better..   Data Reviewed: Basic Metabolic Panel:  Recent Labs Lab 08/29/13 1558  NA 141  K 4.1  CL 103  CO2 26  GLUCOSE 104*  BUN 15  CREATININE 0.83  CALCIUM 10.7*   Liver Function Tests: No results found for this basename:  AST, ALT, ALKPHOS, BILITOT, PROT, ALBUMIN,  in the last 168 hours No results found for this basename: LIPASE, AMYLASE,  in the last 168 hours No results found for this basename: AMMONIA,  in the last 168 hours CBC:  Recent Labs Lab 08/29/13 1558  WBC 10.1  NEUTROABS 7.8*  HGB 14.7  HCT 43.3  MCV 97.7  PLT 306   Cardiac Enzymes:  Recent Labs Lab 08/29/13 1558  TROPONINI <0.30    BNP (last 3 results) No results found for this basename: PROBNP,  in the last 8760 hours CBG: No results found for this basename: GLUCAP,  in the last 168 hours  Recent Results (from the past 240 hour(s))  MRSA PCR SCREENING     Status: Abnormal   Collection Time    08/30/13  5:28 PM      Result Value Range Status   MRSA by PCR POSITIVE (*) NEGATIVE Final   Comment:            The GeneXpert MRSA Assay (FDA     approved for NASAL specimens     only), is one component of a     comprehensive MRSA colonization     surveillance program. It is not     intended to diagnose MRSA     infection nor to guide or     monitor treatment for     MRSA infections.     RESULT CALLED TO, READ BACK BY AND VERIFIED WITH:     TRACY GREGORY 1905 08/30/13 BY WOOLLENK      Additional labs: 1. TSH: 0.719, total T4: 10, free Thyroxine index: 3.3 and T3 uptake ratio: 33 2. Carotid Dopplers: Summary: Right: mild mixed plaque origin ICA and ECA. Left: moderate soft plaque distal CCA and origin and proximal ICA. Bilateral: 1-39% ICA stenosis. Vertebral artery flow is antegrade. ICA/CCA ratio: R- 1.59 L-1.57       Studies: No results found.      Scheduled Meds: .  ceFAZolin (ANCEF) IV  2 g Intravenous On Call  . Chlorhexidine Gluconate Cloth  6 each Topical Q0600  . gentamicin irrigation  80 mg Irrigation On Call  . levothyroxine  112 mcg Oral QAC breakfast  . multivitamin with minerals  1 tablet Oral Daily  . mupirocin ointment  1 application Nasal BID  . sodium chloride  3 mL Intravenous Q12H   Continuous Infusions: . sodium chloride 50 mL/hr at 09/01/13 1200    Principal Problem:   Near syncope Active Problems:   Hypothyroidism   Fall at home, with facial injury   UTI (lower urinary tract infection)   Heart block    Time spent: 25 minutes.    Marcellus Scott, MD, FACP, FHM. [facility] Pager [phone]  If 7PM-7AM, please contact  night-coverage [email or web address] Password [email or web address] 09/01/2013, 12:52 PM    LOS: 3 days

## 2013-09-01 NOTE — CV Procedure (Signed)
DDD PM insertion via the left subclavian vein. Z#[id number].

## 2013-09-01 NOTE — Consult Note (Signed)
  Reason for Consult:syncope  Referring Physician: Dr. Vevelyn Royals is an 79 y.o. female.   HPI: The patient is an 79 year old woman with a history of arthritis, diverticulitis, and urinary tract infections, never had a syncopal episode until she passed out, prompting her admission. She was in the kitchen, and was getting up to go to the sink when she suddenly fell to the ground, and fractured her nose. She was taken to the hospital and on telemetry found to have sinus rhythm with right bundle branch block and periods of complete heart block while she was awake and with no reversible causes. These were Stokes Adams attacks. Previously, she had dizzy spells but no frank syncope. She denies associated chest pain, shortness of breath, or peripheral edema.  PMH: Past Medical History  Diagnosis Date  . Back pain   . UTI (urinary tract infection)   . Hypothyroid   . Diverticulitis   . Liver mass   . Rectal polyp     PSHX: Past Surgical History  Procedure Laterality Date  . Abdominal hysterectomy    . Flexible sigmoidoscopy N/A 01/17/2013    Procedure: FLEXIBLE SIGMOIDOSCOPY;  Surgeon: Beverley Fiedler, MD;  Location: [facility] ENDOSCOPY;  Service: Gastroenterology;  Laterality: N/A;    FAMHX: Family History  Problem Relation Age of Onset  . Breast cancer Sister   . Diabetes Sister   . Heart disease Sister   . Heart disease Mother     Social History:  reports that she has never smoked. She has never used smokeless tobacco. She reports that she does not drink alcohol or use illicit drugs.  Allergies: No Known Allergies  Medications: Reviewed  ROS  As stated in the HPI and negative for all other systems.  Physical Exam  Vitals:Blood pressure 132/57, pulse 84, temperature 97.5 F (36.4 C), temperature source Oral, resp. rate 14, height 5\' 5"  (1.651 m), weight 144 lb 10 oz (65.6 kg), SpO2 98.00%.  Well appearing elderly woman, NAD HEENT: Unremarkable, except for bilateral  preorbital ecchymoses Neck:  No JVD, no thyromegally Back:  No CVA tenderness Lungs:  Clear with no wheezes, rales, or rhonchi. HEART:  Regular rate rhythm, no murmurs, no rubs, no clicks Abd:  Flat, positive bowel sounds, no organomegally, no rebound, no guarding Ext:  2 plus pulses, no edema, no cyanosis, no clubbing Skin:  No rashes no nodules Neuro:  CN II through XII intact, motor grossly intact  Telemetry - normal sinus rhythm with intermittent complete heart block , and underlying right bundle branch block  Assessment/Plan: Syncope - the patient has syncope and intermittent complete heart block in the setting of right bundle branch block. She has a clear-cut indication for permanent pacemaker insertion and she has no reversible causes. I've discussed the risk, goals, benefits, and expectations, of permanent pacemaker insertion with the patient and her son and she wishes to proceed.  Sharlot Gowda TaylorMD 09/01/2013, 3:54 PM

## 2013-09-01 NOTE — Progress Notes (Signed)
Orthopedic Tech Progress Note Patient Details:  [name] [facility] Oct 21, 1928 [id number]  Ortho Devices Type of Ortho Device: Arm sling Ortho Device/Splint Location: lue Ortho Device/Splint Interventions: Application   Nikki Dom 09/01/2013, 7:44 PM

## 2013-09-02 ENCOUNTER — Inpatient Hospital Stay (HOSPITAL_COMMUNITY): Payer: Medicare Other

## 2013-09-02 DIAGNOSIS — E039 Hypothyroidism, unspecified: Secondary | ICD-10-CM | POA: Diagnosis not present

## 2013-09-02 DIAGNOSIS — I452 Bifascicular block: Secondary | ICD-10-CM | POA: Diagnosis not present

## 2013-09-02 DIAGNOSIS — I442 Atrioventricular block, complete: Secondary | ICD-10-CM | POA: Diagnosis not present

## 2013-09-02 DIAGNOSIS — N390 Urinary tract infection, site not specified: Secondary | ICD-10-CM | POA: Diagnosis not present

## 2013-09-02 DIAGNOSIS — R55 Syncope and collapse: Secondary | ICD-10-CM | POA: Diagnosis not present

## 2013-09-02 DIAGNOSIS — I511 Rupture of chordae tendineae, not elsewhere classified: Secondary | ICD-10-CM | POA: Diagnosis not present

## 2013-09-02 DIAGNOSIS — R0789 Other chest pain: Secondary | ICD-10-CM | POA: Diagnosis not present

## 2013-09-02 MED ORDER — MUPIROCIN 2 % EX OINT: 1.0000 "application " | TOPICAL_OINTMENT | Freq: Two times a day (BID) | CUTANEOUS | Status: DC

## 2013-09-02 MED ORDER — CHLORHEXIDINE GLUCONATE CLOTH 2 % EX PADS: 6.0000 | MEDICATED_PAD | Freq: Every day | CUTANEOUS | Status: DC

## 2013-09-02 MED ORDER — TRAMADOL HCL 50 MG PO TABS
50.0000 mg | ORAL_TABLET | Freq: Four times a day (QID) | ORAL | Status: DC | PRN
  Administered 2013-09-02 (×2): 50 mg via ORAL
  Filled 2013-09-02 (×2): qty 1

## 2013-09-02 MED ORDER — ACETAMINOPHEN 325 MG PO TABS: 325.0000 mg | ORAL_TABLET | Freq: Four times a day (QID) | ORAL | Status: DC | PRN

## 2013-09-02 NOTE — Progress Notes (Signed)
Utilization Review Completed Laraina Sulton J. Debbe Crumble, RN, BSN, NCM [phone]  

## 2013-09-02 NOTE — Op Note (Signed)
NAMEMarland Kitchen  [name], [name] NO.:  [id number]  MEDICAL RECORD NO.:  [id number]  LOCATION:  6C03C                        FACILITY:  [facility]  PHYSICIAN:  Doylene Canning. Ladona Ridgel, MD    DATE OF BIRTH:  12-08-1927  DATE OF PROCEDURE:  09/01/2013 DATE OF DISCHARGE:                              OPERATIVE REPORT   PROCEDURE PERFORMED:  Insertion of a dual-chamber pacemaker.  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  INTRODUCTION:  The patient is an 79 year old woman who presented to the hospital after sustaining a syncopal episode and breaking her nose.  She had bilateral ecchymoses over her orbit.  The patient, in the hospital, had intermittent episodes of complete heart block with no reversible causes and is now referred for insertion of a dual-chamber pacemaker.  DESCRIPTION OF PROCEDURE:  After informed consent was obtained, the patient was taken to the diagnostic EP lab in a fasting state.  After usual preparation and draping, intravenous fentanyl and midazolam were given for sedation.  A 30 mL of lidocaine was infiltrated into the left infraclavicular region.  A 5-cm incision was carried out over this region and electrocautery was utilized to dissect down to the fascial plane.  Initial attempts to puncture the left subclavian vein were unsuccessful.  A 10 mL of IV contrast was injected into the left upper extremity venous system, demonstrating that the vein was displaced somewhat caudally.  The vein was then punctured successfully and the Medtronic model 5076, 52-cm active fixation pacing lead serial #[id number] was advanced into the right ventricle.  The Medtronic model [id number], 45-cm active fixation pacing lead serial #[id number] was advanced to the right atrium.  Mapping was then carried out in the right ventricle.  At the final site, the R-waves measured 20 mV.  The pacing impedance with lead actively fixed was 900 ohms and threshold was a V  at 0.5 milliseconds.  A large injury current was present and 10 V pacing did not stimulate the diaphragm.  With these satisfactory parameters, attention was then turned to placement of the atrial lead, was placed in the anterolateral portion of the right atrium where P-waves measured 5 mV.  The pacing impedance was 600 ohms and the threshold was a V at 0.5 milliseconds.  A 10 V pacing did not stimulate the diaphragm.  With active fixation lead, there was a large injury current.  With both the atrial and the ventricular leads in satisfactory position, they were secured to the subpectoral fascia with a figure-of-eight silk suture. The sewing sleeve was secured with silk suture.  Electrocautery was utilized to make a subcutaneous pocket.  Antibiotic irrigation was utilized to irrigate the pocket.  At this point, the Medtronic Adapta L dual-chamber pacemaker, serial [id number] was connected to the atrial and the RV lead and placed back in the subcutaneous pocket.  The pocket was irrigated with antibiotic irrigation.  The incision was closed with 2-0 and 3-0 Vicryl.  Benzoin was painted on the skin.  Steri-Strips were applied and the pressure dressing in place.  The patient was returned to her room in satisfactory condition.  COMPLICATIONS:  There were no immediate procedure complications.  RESULTS:  This demonstrates  successful implantation of a Medtronic dual- chamber pacemaker in a patient with intermittent complete heart block.     Doylene Canning. Ladona Ridgel, MD     GWT/MEDQ  D:  09/01/2013  T:  09/02/2013  Job:  [id number]

## 2013-09-02 NOTE — Progress Notes (Signed)
Spoke to patient regarding home health. Patient stated her son was coming to bring her home and stay awhile. She has friends and people from church that would come and help her if she needs help. She feels comfortable going home with family at this time. She stated she would call the doctors office if things changed and she felt she needed some help. Patient thanked [place] for our concern.

## 2013-09-02 NOTE — Progress Notes (Signed)
   ELECTROPHYSIOLOGY ROUNDING NOTE    Patient Name: [name] [facility] Date of Encounter: 09/02/2013    SUBJECTIVE:Patient feels well today.  No chest pain or shortness of breath. S/p PPM insertion 09-01-2013  TELEMETRY: Reviewed telemetry pt in sinus rhythm with intermittent ventricular pacing Filed Vitals:   09/01/13 2000 09/01/13 2100 09/02/13 0000 09/02/13 0434  BP: 141/45 124/84 117/39 133/38  Pulse: 80 72 69 69  Temp: 97.8 F (36.6 C)  98 F (36.7 C) 97.5 F (36.4 C)  TempSrc: Oral  Oral Oral  Resp: 17  18 20   Height:      Weight:    145 lb 4.5 oz (65.9 kg)  SpO2: 97% 98% 95% 94%    Intake/Output Summary (Last 24 hours) at 09/02/13 0725 Last data filed at 09/02/13 0600  Gross per 24 hour  Intake    580 ml  Output      0 ml  Net    580 ml    CURRENT MEDICATIONS: .  ceFAZolin (ANCEF) IV  1 g Intravenous Q6H  . Chlorhexidine Gluconate Cloth  6 each Topical Q0600  . levothyroxine  112 mcg Oral QAC breakfast  . multivitamin with minerals  1 tablet Oral Daily  . mupirocin ointment  1 application Nasal BID  . sodium chloride  3 mL Intravenous Q12H    LABS: Basic Metabolic Panel: No results found for this basename: NA, K, CL, CO2, GLUCOSE, BUN, CREATININE, CALCIUM, MG, PHOS,  in the last 72 hours Liver Function Tests: No results found for this basename: AST, ALT, ALKPHOS, BILITOT, PROT, ALBUMIN,  in the last 72 hours No results found for this basename: LIPASE, AMYLASE,  in the last 72 hours CBC: No results found for this basename: WBC, NEUTROABS, HGB, HCT, MCV, PLT,  in the last 72 hours Cardiac Enzymes: No results found for this basename: CKTOTAL, CKMB, CKMBINDEX, TROPONINI,  in the last 72 hours BNP: No components found with this basename: POCBNP,  D-Dimer: No results found for this basename: DDIMER,  in the last 72 hours Hemoglobin A1C: No results found for this basename: HGBA1C,  in the last 72 hours Fasting Lipid Panel: No results found for this basename:  CHOL, HDL, LDLCALC, TRIG, CHOLHDL, LDLDIRECT,  in the last 72 hours Thyroid Function Tests: No results found for this basename: TSH, T4TOTAL, FREET3, T3FREE, THYROIDAB,  in the last 72 hours Anemia Panel: No results found for this basename: VITAMINB12, FOLATE, FERRITIN, TIBC, IRON, RETICCTPCT,  in the last 72 hours  Radiology/Studies:  Final result pending, leads in stable position.  PHYSICAL EXAM Left chest without hematoma or ecchymosis  DEVICE INTERROGATION: Device interrogated by industry.  Lead values including impedence, sensing, threshold within normal values.    Principal Problem:   Near syncope Active Problems:   Hypothyroidism   Fall at home, with facial injury   UTI (lower urinary tract infection)   Heart block     See my progress note.  Leonia Reeves.D.

## 2013-09-02 NOTE — Progress Notes (Signed)
Patient ID: [name], female   DOB: 11-09-1927, 79 y.o.   MRN: [id number] Subjective:  " I feel better "  Objective:  Vital Signs in the last 24 hours: Temp:  [97.5 F (36.4 C)-98 F (36.7 C)] 97.5 F (36.4 C) (11/04 0434) Pulse Rate:  [67-80] 69 (11/04 0434) Resp:  [16-20] 20 (11/04 0434) BP: (117-167)/(38-84) 133/38 mmHg (11/04 0434) SpO2:  [94 %-100 %] 94 % (11/04 0434) Weight:  [145 lb 4.5 oz (65.9 kg)] 145 lb 4.5 oz (65.9 kg) (11/04 0434)  Intake/Output from previous day: 11/03 0701 - 11/04 0700 In: 580 [P.O.:480; IV Piggyback:100] Out: -  Intake/Output from this shift:    Physical Exam: Well appearing elderly woman, NAD HEENT: Unremarkable Neck:  No JVD, no thyromegally Back:  No CVA tenderness Lungs:  Clear with no wheezes. PPM incision without hematoma. HEART:  Regular rate rhythm, no murmurs, no rubs, no clicks Abd:  soft, positive bowel sounds, no organomegally, no rebound, no guarding Ext:  2 plus pulses, no edema, no cyanosis, no clubbing Skin:  No rashes no nodules Neuro:  CN II through XII intact, motor grossly intact  Lab Results: No results found for this basename: WBC, HGB, PLT,  in the last 72 hours No results found for this basename: NA, K, CL, CO2, GLUCOSE, BUN, CREATININE,  in the last 72 hours No results found for this basename: TROPONINI, CK, MB,  in the last 72 hours Hepatic Function Panel No results found for this basename: PROT, ALBUMIN, AST, ALT, ALKPHOS, BILITOT, BILIDIR, IBILI,  in the last 72 hours No results found for this basename: CHOL,  in the last 72 hours No results found for this basename: PROTIME,  in the last 72 hours  Imaging: Dg Chest 2 View  09/02/2013   CLINICAL DATA:  Chest soreness following pacemaker placement  EXAM: CHEST  2 VIEW  COMPARISON:  08/29/2013  FINDINGS: A pacing device is now seen. The leads appear in appropriate position. No pneumothorax is noted. The lungs are well aerated. The cardiac shadow is stable.   IMPRESSION: No acute abnormality following pacemaker placement.   Electronically Signed   By: Alcide Clever M.D.   On: 09/02/2013 07:37    Cardiac Studies: Tele - NSR with RBBB Assessment/Plan:  1. Stokes-Adams syncope 2. Transient AV block 3. S/p PPM Rec: ok for discharge home. Usual followup.   LOS: 4 days    Noach Calvillo,M.D. 09/02/2013, 8:34 AM

## 2013-09-02 NOTE — Discharge Summary (Signed)
Physician Discharge Summary  [name] ZOX:[id number] DOB: [birth year] DOA: 08/29/2013  PCP: Rudi Heap, MD  Admit date: 08/29/2013 Discharge date: 09/02/2013  Time spent: Less than 30 minutes  Recommendations for Outpatient Follow-up:  1. Dr. Rudi Heap, PCP in 1 week. 2. Dr. Lewayne Bunting, Cardiology/EPS.  Discharge Diagnoses:  Principal Problem:   Near syncope Active Problems:   Hypothyroidism   Fall at home, with facial injury   UTI (lower urinary tract infection)   Heart block   Discharge Condition: Improved & Stable  Diet recommendation: Heart Healthy diet.  Filed Weights   08/29/13 2134 08/31/13 0300 09/02/13 0434  Weight: 66.906 kg (147 lb 8 oz) 65.6 kg (144 lb 10 oz) 65.9 kg (145 lb 4.5 oz)    History of present illness:  79 year old female, active and independent, history of hypothyroidism, UTI, admitted on 08/29/13 with symptoms suggestive of near syncope, fall and right nasal bone fracture. She denied prior cardiac history. In the ED, EKG revealed RBBB AND LAFB, CT head and neck were negative for acute findings. CT of the maxillofacial area showed mildly displaced right nasal bone fracture. UA positive for many bacteria but not significant leukocytes. Hospitalist admission requested. No further episodes since admission. Telemetry however shows couple of episodes of 2-5 nonconducted P waves suspicious for high degree heart block. Cardiology has been consulted. Patient was transferred to the step down unit for close monitoring.   Hospital Course:   Near syncope & fall  - Likely related to heart block and bradycardia  - Patient was monitored on tele which showed transient complete heart block. She was moved to step down for closer monitoring. She initially declined but later agreed for PPM. - Negative orthostatic blood pressures  - 2-D echo: Mild to moderate LVH, LVEF 60-65%, mild to moderate AR,? Ruptured chordae tendineae & mild to moderate MR  - Carotid  Dopplers: 1-39% ICA stenosis bilaterally.  - S/P PPM 11/3. Cardiology has cleared patient for DC. - Ambulating steadily. Patient states that her son is coming into town and will be staying with her for a couple of days.  Stokes-Adams syncope/Intermittent complete heart block/RBBB - Cardiology input appreciated  - S/P PPM - Cardiology has cleared for DC  Possible UTI  - Continue IV Rocephin 3/3 days. Unfortunately no urine cultures were sent.  - Completed treatment.   Hypothyroidism  - Continue Synthroid   Mildly displaced right nasal bone fracture  - Probably conservative management.   MRSA + - will complete Rx OP   Consultations:  Cardiology  Procedures:  PPM 11/3    Discharge Exam:  Complaints:  Denies complaints.  Filed Vitals:   09/01/13 2000 09/01/13 2100 09/02/13 0000 09/02/13 0434  BP: 141/45 124/84 117/39 133/38  Pulse: 80 72 69 69  Temp: 97.8 F (36.6 C)  98 F (36.7 C) 97.5 F (36.4 C)  TempSrc: Oral  Oral Oral  Resp: 17  18 20   Height:      Weight:    65.9 kg (145 lb 4.5 oz)  SpO2: 97% 98% 95% 94%    General exam: Pleasant elderly female sitting up at the edge of bed. In no distress.  Respiratory system: Clear. No increased work of breathing.  Cardiovascular system: S1 & S2 heard, RRR. No JVD, murmurs, gallops, clicks or pedal edema. Telemetry: Mostly sinus rhythm. Demand A Paced rhythm. Gastrointestinal system: Abdomen is nondistended, soft and nontender. Normal bowel sounds heard.  Central nervous system: Alert and oriented. No focal neurological deficits.  Extremities: Symmetric 5 x 5 power.  ENT: Bilateral infraorbital and nasal bridge bruising- improving.  Discharge Instructions  Discharge Orders   Future Appointments Provider Department Dept Phone   01/26/2014 11:00 AM Ernestina Penna, MD [facility] [phone]   Future Orders Complete By Expires   Call MD for:  extreme fatigue  As directed    Call MD for:   persistant dizziness or light-headedness  As directed    Call MD for:  redness, tenderness, or signs of infection (pain, swelling, redness, odor or green/yellow discharge around incision site)  As directed    Call MD for:  severe uncontrolled pain  As directed    Call MD for:  temperature >100.4  As directed    Call MD for:  As directed    Comments:     Feel like passing out.   Diet - low sodium heart healthy  As directed    Increase activity slowly  As directed        Medication List         acetaminophen 325 MG tablet  Commonly known as:  TYLENOL  Take 1-2 tablets (325-650 mg total) by mouth every 6 (six) hours as needed (pain).     Chlorhexidine Gluconate Cloth 2 % Pads  Apply 6 each topically daily at 6 (six) AM. Use for 2 more days.     multivitamin with minerals Tabs tablet  Take 1 tablet by mouth daily.     mupirocin ointment 2 %  Commonly known as:  BACTROBAN  Place 1 application into the nose 2 (two) times daily. For mupirocin (BACTROBAN) Nasal - with cotton tip swab, apply pea sized amount into each nare.  Gently press sides of nose together to spread oint. AVOID contact with eyes. Use for 2 more days.     SYNTHROID 112 MCG tablet  Generic drug:  levothyroxine  Take 1 tablet (112 mcg total) by mouth daily before breakfast.           Follow-up Information   Follow up with Rudi Heap, MD. Schedule an appointment as soon as possible for a visit in 1 week.   Specialty:  Family Medicine   Contact information:   [address] [place] [address] [phone]       Schedule an appointment as soon as possible for a visit with Lewayne Bunting, MD.   Specialty:  Cardiology   Contact information:   [address] [place] [address] [phone]        The results of significant diagnostics from this hospitalization (including imaging, microbiology, ancillary and laboratory) are listed below for reference.    Significant Diagnostic  Studies: Dg Chest 2 View  09/02/2013   CLINICAL DATA:  Chest soreness following pacemaker placement  EXAM: CHEST  2 VIEW  COMPARISON:  08/29/2013  FINDINGS: A pacing device is now seen. The leads appear in appropriate position. No pneumothorax is noted. The lungs are well aerated. The cardiac shadow is stable.  IMPRESSION: No acute abnormality following pacemaker placement.   Electronically Signed   By: Alcide Clever M.D.   On: 09/02/2013 07:37   Dg Chest 2 View  08/29/2013   CLINICAL DATA:  Syncope. Fall.  EXAM: CHEST  2 VIEW  COMPARISON:  02/22/2005  FINDINGS: Heart size is at the upper limits of normal. No evidence of pulmonary edema or other infiltrate. No evidence of pleural effusion. No mass or lymphadenopathy identified. Atherosclerotic calcification of the  thoracic aorta again noted.  IMPRESSION: Borderline cardiomegaly. No active lung disease.   Electronically Signed   By: Myles Rosenthal M.D.   On: 08/29/2013 17:12   Ct Head Wo Contrast  08/29/2013   CLINICAL DATA:  Recent traumatic injury with pain  EXAM: CT HEAD WITHOUT CONTRAST  CT MAXILLOFACIAL WITHOUT CONTRAST  CT CERVICAL SPINE WITHOUT CONTRAST  TECHNIQUE: Multidetector CT imaging of the head, cervical spine, and maxillofacial structures were performed using the standard protocol without intravenous contrast. Multiplanar CT image reconstructions of the cervical spine and maxillofacial structures were also generated.  COMPARISON:  None.  FINDINGS: CT HEAD FINDINGS  The bony calvarium is intact. No gross soft tissue abnormality is seen. Mild atrophic changes are seen. No findings to suggest acute hemorrhage, acute infarction or space-occupying mass lesion are noted.  CT MAXILLOFACIAL FINDINGS  The paranasal sinuses are well aerated. No mucosal changes are seen. A minimally displaced right nasal bone fracture is seen. No associated soft tissue abnormality is seen. The orbits and their contents are within normal limits. The facial bones are  otherwise within normal limits.  CT CERVICAL SPINE FINDINGS  Seven cervical segments are well visualized. Vertebral body height is well maintained. Mild osteophytic changes are noted at C4-5 and C5-6 as well as C6-7. Mild anterolisthesis of C3 on C4 is noted of a degenerative nature. No acute fracture or acute facet abnormality is noted. The surrounding soft tissues and visualized lung apices are within normal limits. Some chronic calcifications are noted.  IMPRESSION: CT of the head: No acute abnormality is noted.  CT of the cervical spine: No evidence of acute abnormality. Mild degenerative changes are noted.  CT of the maxillofacial bones: Mildly displaced right nasal bone fracture.   Electronically Signed   By: Alcide Clever M.D.   On: 08/29/2013 17:49   Ct Cervical Spine Wo Contrast  08/29/2013   CLINICAL DATA:  Recent traumatic injury with pain  EXAM: CT HEAD WITHOUT CONTRAST  CT MAXILLOFACIAL WITHOUT CONTRAST  CT CERVICAL SPINE WITHOUT CONTRAST  TECHNIQUE: Multidetector CT imaging of the head, cervical spine, and maxillofacial structures were performed using the standard protocol without intravenous contrast. Multiplanar CT image reconstructions of the cervical spine and maxillofacial structures were also generated.  COMPARISON:  None.  FINDINGS: CT HEAD FINDINGS  The bony calvarium is intact. No gross soft tissue abnormality is seen. Mild atrophic changes are seen. No findings to suggest acute hemorrhage, acute infarction or space-occupying mass lesion are noted.  CT MAXILLOFACIAL FINDINGS  The paranasal sinuses are well aerated. No mucosal changes are seen. A minimally displaced right nasal bone fracture is seen. No associated soft tissue abnormality is seen. The orbits and their contents are within normal limits. The facial bones are otherwise within normal limits.  CT CERVICAL SPINE FINDINGS  Seven cervical segments are well visualized. Vertebral body height is well maintained. Mild osteophytic changes  are noted at C4-5 and C5-6 as well as C6-7. Mild anterolisthesis of C3 on C4 is noted of a degenerative nature. No acute fracture or acute facet abnormality is noted. The surrounding soft tissues and visualized lung apices are within normal limits. Some chronic calcifications are noted.  IMPRESSION: CT of the head: No acute abnormality is noted.  CT of the cervical spine: No evidence of acute abnormality. Mild degenerative changes are noted.  CT of the maxillofacial bones: Mildly displaced right nasal bone fracture.   Electronically Signed   By: Alcide Clever M.D.   On: 08/29/2013  17:49   Ct Maxillofacial Wo Cm  08/29/2013   CLINICAL DATA:  Recent traumatic injury with pain  EXAM: CT HEAD WITHOUT CONTRAST  CT MAXILLOFACIAL WITHOUT CONTRAST  CT CERVICAL SPINE WITHOUT CONTRAST  TECHNIQUE: Multidetector CT imaging of the head, cervical spine, and maxillofacial structures were performed using the standard protocol without intravenous contrast. Multiplanar CT image reconstructions of the cervical spine and maxillofacial structures were also generated.  COMPARISON:  None.  FINDINGS: CT HEAD FINDINGS  The bony calvarium is intact. No gross soft tissue abnormality is seen. Mild atrophic changes are seen. No findings to suggest acute hemorrhage, acute infarction or space-occupying mass lesion are noted.  CT MAXILLOFACIAL FINDINGS  The paranasal sinuses are well aerated. No mucosal changes are seen. A minimally displaced right nasal bone fracture is seen. No associated soft tissue abnormality is seen. The orbits and their contents are within normal limits. The facial bones are otherwise within normal limits.  CT CERVICAL SPINE FINDINGS  Seven cervical segments are well visualized. Vertebral body height is well maintained. Mild osteophytic changes are noted at C4-5 and C5-6 as well as C6-7. Mild anterolisthesis of C3 on C4 is noted of a degenerative nature. No acute fracture or acute facet abnormality is noted. The  surrounding soft tissues and visualized lung apices are within normal limits. Some chronic calcifications are noted.  IMPRESSION: CT of the head: No acute abnormality is noted.  CT of the cervical spine: No evidence of acute abnormality. Mild degenerative changes are noted.  CT of the maxillofacial bones: Mildly displaced right nasal bone fracture.   Electronically Signed   By: Alcide Clever M.D.   On: 08/29/2013 17:49    Microbiology: Recent Results (from the past 240 hour(s))  MRSA PCR SCREENING     Status: Abnormal   Collection Time    08/30/13  5:28 PM      Result Value Range Status   MRSA by PCR POSITIVE (*) NEGATIVE Final   Comment:            The GeneXpert MRSA Assay (FDA     approved for NASAL specimens     only), is one component of a     comprehensive MRSA colonization     surveillance program. It is not     intended to diagnose MRSA     infection nor to guide or     monitor treatment for     MRSA infections.     RESULT CALLED TO, READ BACK BY AND VERIFIED WITH:     Delphia Grates 1903 08/30/13 BY WOOLLENK     Labs: Basic Metabolic Panel:  Recent Labs Lab 08/29/13 1558  NA 141  K 4.1  CL 103  CO2 26  GLUCOSE 104*  BUN 15  CREATININE 0.83  CALCIUM 10.7*   Liver Function Tests: No results found for this basename: AST, ALT, ALKPHOS, BILITOT, PROT, ALBUMIN,  in the last 168 hours No results found for this basename: LIPASE, AMYLASE,  in the last 168 hours No results found for this basename: AMMONIA,  in the last 168 hours CBC:  Recent Labs Lab 08/29/13 1558  WBC 10.1  NEUTROABS 7.8*  HGB 14.7  HCT 43.3  MCV 97.7  PLT 306   Cardiac Enzymes:  Recent Labs Lab 08/29/13 1558  TROPONINI <0.30   BNP: BNP (last 3 results) No results found for this basename: PROBNP,  in the last 8760 hours CBG: No results found for this basename: GLUCAP,  in the  last 168 hours  Additional labs: Additional labs:  1. TSH: 0.719, total T4: 10, free Thyroxine index: 3.3 and  T3 uptake ratio: 33 2. Carotid Dopplers: Summary: Right: mild mixed plaque origin ICA and ECA. Left: moderate soft plaque distal CCA and origin and proximal ICA. Bilateral: 1-39% ICA stenosis. Vertebral artery flow is antegrade. ICA/CCA ratio: R- 1.59 L-1.57   Signed:  Marcellus Scott, MD, FACP, FHM. [facility] Pager [phone]  If 7PM-7AM, please contact night-coverage [email or web address] Password [email or web address] 09/02/2013, 11:56 AM

## 2013-09-04 ENCOUNTER — Telehealth: Payer: Self-pay | Admitting: *Deleted

## 2013-09-04 ENCOUNTER — Encounter: Payer: Self-pay | Admitting: Family Medicine

## 2013-09-04 ENCOUNTER — Ambulatory Visit (INDEPENDENT_AMBULATORY_CARE_PROVIDER_SITE_OTHER): Payer: Medicare Other | Admitting: Family Medicine

## 2013-09-04 VITALS — BP 156/75 | HR 84 | Temp 97.8°F | Ht 65.0 in | Wt 147.0 lb

## 2013-09-04 DIAGNOSIS — Z5189 Encounter for other specified aftercare: Secondary | ICD-10-CM

## 2013-09-04 DIAGNOSIS — Z8679 Personal history of other diseases of the circulatory system: Secondary | ICD-10-CM

## 2013-09-04 DIAGNOSIS — I459 Conduction disorder, unspecified: Secondary | ICD-10-CM

## 2013-09-04 DIAGNOSIS — S0083XD Contusion of other part of head, subsequent encounter: Secondary | ICD-10-CM

## 2013-09-04 DIAGNOSIS — Z950 Presence of cardiac pacemaker: Secondary | ICD-10-CM

## 2013-09-04 NOTE — Telephone Encounter (Signed)
Kim, the patients daughter called in somewhat upset. She is a retired Engineer, civil (consulting) and stated that the hospital did a poor job with her mothers discharge instructions, and failed to call in her Chlorhexidine Gluconate Pads. She wants to know if she still needs them and I was not sure since the instruction say use for 2 more days. If she does need them she wants them sent to [facility] in [place]. The daughters name is Lynden Ang and her cell # is [phone] she would appreciate a call back. Thanks, MI

## 2013-09-04 NOTE — Patient Instructions (Addendum)
Continue current medications. Continue good therapeutic lifestyle changes.  Fall precautions discussed with patient. Follow up as planned and earlier as needed.  Followup with Dr. Ladona Ridgel and Dr. Rhea Belton as planned Do not apply any more dressings to the chest wall Use Cortizone-10 sparingly to take reaction once or twice daily

## 2013-09-04 NOTE — Telephone Encounter (Signed)
Spoke with patients friend, she states they didn't know they were suppose to take dressing off and apply this pad, she is going to see Dr Christell Constant today in [facility], she states they are going to ask them to remove bandage, they will call me back in the afternoon and let me know if they want to order the pads, I encouraged use of these even though its been 2 days since discharge. Original orders are for 6 topically one time daily for 2 more days-discharge note. [facility], [place]

## 2013-09-04 NOTE — Progress Notes (Signed)
Subjective:    Patient ID: [name], female    DOB: Jan 31, 1928, 79 y.o.   MRN: [id number]  HPI Patient here today for hospital follow up fron [facility] for pacemaker placement on 09-01-13. Dr. Ladona Ridgel did the pacemaker implantation and he will be seeing her back for followup on Nov 10. She was admitted to the hospital on October 31 because of syncope at home with an EKG that showed bundle branch block and the pacemaker was inserted subsequently. She comes in today with a friend. The friend is a retired Engineer, civil (consulting).     Patient Active Problem List   Diagnosis Date Noted  . Heart block 08/31/2013  . Near syncope 08/29/2013  . Fall at home, with facial injury 08/29/2013  . UTI (lower urinary tract infection) 08/29/2013  . Sigmoid diverticulitis 04/23/2013  . Hypothyroidism 04/23/2013  . Chronic back pain 04/23/2013  . Villous adenoma of rectum 01/16/2013  . Mass of left lobe of liver 01/16/2013   Outpatient Encounter Prescriptions as of 09/04/2013  Medication Sig  . acetaminophen (TYLENOL) 325 MG tablet Take 1-2 tablets (325-650 mg total) by mouth every 6 (six) hours as needed (pain).  . Multiple Vitamin (MULTIVITAMIN WITH MINERALS) TABS Take 1 tablet by mouth daily.  . mupirocin ointment (BACTROBAN) 2 % Place 1 application into the nose 2 (two) times daily. For mupirocin (BACTROBAN) Nasal - with cotton tip swab, apply pea sized amount into each nare.  Gently press sides of nose together to spread oint. AVOID contact with eyes. Use for 2 more days.  Marland Kitchen SYNTHROID 112 MCG tablet Take 1 tablet (112 mcg total) by mouth daily before breakfast.  . Chlorhexidine Gluconate Cloth 2 % PADS Apply 6 each topically daily at 6 (six) AM. Use for 2 more days.    Review of Systems  Constitutional: Negative.  Negative for fever.  HENT: Negative.   Eyes: Negative.   Respiratory: Negative.   Cardiovascular: Negative.   Gastrointestinal: Negative.   Endocrine: Negative.   Genitourinary: Negative.    Musculoskeletal: Negative.   Skin: Negative.   Allergic/Immunologic: Negative.   Neurological: Negative.  Negative for weakness.  Hematological: Negative.   Psychiatric/Behavioral: Negative.        Objective:   Physical Exam  Nursing note and vitals reviewed. Constitutional: She is oriented to person, place, and time. She appears well-developed and well-nourished. No distress.  HENT:  Head: Normocephalic and atraumatic.  Eyes: Conjunctivae and EOM are normal. Pupils are equal, round, and reactive to light. Right eye exhibits no discharge. Left eye exhibits no discharge. No scleral icterus.  Neck: Normal range of motion. Neck supple. No thyromegaly present.  Cardiovascular: Normal rate and regular rhythm.   Murmur heard. At 60 per minute, there was a grade 2/6 diastolic murmur  Pulmonary/Chest: Effort normal and breath sounds normal. She has no wheezes. She has no rales.  Abdominal: Soft. She exhibits no mass. There is no tenderness. There is no guarding.  Musculoskeletal: Normal range of motion. She exhibits no edema.  Lymphadenopathy:    She has no cervical adenopathy.  Neurological: She is alert and oriented to person, place, and time.  Skin: Skin is warm and dry. Rash (contact sensitivity to tape) noted. No erythema.  There is some bruising around the pacemaker insertion site but no redness or erythema other than some contact sensitivity to the tape that she has been using.  Psychiatric: She has a normal mood and affect. Her behavior is normal. Judgment and thought content normal.  BP 156/75  Pulse 84  Temp(Src) 97.8 F (36.6 C) (Oral)  Ht 5\' 5"  (1.651 m)  Wt 147 lb (66.679 kg)  BMI 24.46 kg/m2        Assessment & Plan:   1. Heart block   2. History of pacemaker   3. Facial contusion, subsequent encounter, resolving    Patient Instructions  Continue current medications. Continue good therapeutic lifestyle changes.  Fall precautions discussed with  patient. Follow up as planned and earlier as needed.  Followup with Dr. Ladona Ridgel and Dr. Rhea Belton as planned Do not apply any more dressings to the chest wall Use Cortizone-10 sparingly to take reaction once or twice daily   Nyra Capes MD

## 2013-09-08 ENCOUNTER — Telehealth: Payer: Self-pay | Admitting: Internal Medicine

## 2013-09-08 ENCOUNTER — Ambulatory Visit: Payer: Medicare Other | Admitting: Family Medicine

## 2013-09-08 NOTE — Telephone Encounter (Signed)
Spoke with patient she has a metallic taste in her mouth and can not sleep.  Just can not get comfortable thinks its related to her device.  Keenan Bachelor called the patient to offer and appointment to come in for reassurance and she told Belenda Cruise she would just come in on Friday as she felt she had over reacted

## 2013-09-08 NOTE — Telephone Encounter (Signed)
New message     Just had pacemaker implanted.  Pt states he had a bad night last night----should she come in sooner than appt.?

## 2013-09-12 ENCOUNTER — Encounter: Payer: Self-pay | Admitting: Internal Medicine

## 2013-09-12 ENCOUNTER — Ambulatory Visit (INDEPENDENT_AMBULATORY_CARE_PROVIDER_SITE_OTHER): Payer: Medicare Other | Admitting: Cardiology

## 2013-09-12 ENCOUNTER — Encounter: Payer: Self-pay | Admitting: Cardiology

## 2013-09-12 VITALS — BP 156/70 | HR 78 | Ht 65.0 in | Wt 145.1 lb

## 2013-09-12 DIAGNOSIS — Z950 Presence of cardiac pacemaker: Secondary | ICD-10-CM | POA: Diagnosis not present

## 2013-09-12 DIAGNOSIS — Z4889 Encounter for other specified surgical aftercare: Secondary | ICD-10-CM

## 2013-09-12 DIAGNOSIS — I442 Atrioventricular block, complete: Secondary | ICD-10-CM | POA: Diagnosis not present

## 2013-09-12 NOTE — Progress Notes (Signed)
Wound check and PPM check in office.  Dual chamber PPM implant done 09/01/2013. Patient without complaints.  Left upper chest / implant site intact, well healed and without bleeding or hematoma. Steri-strips were removed without difficulty.  PPM interrogation shows normal PPM function with stable lead measurements. No programming changes made. See PaceArt report.   Return to clinic for follow-up with Dr Ladona Ridgel 12/09/2013 at 2:15 pm.

## 2013-09-12 NOTE — Patient Instructions (Signed)
Your physician recommends that you schedule a follow-up appointment in: KEEP APPOINTMENT WITH DR. Ladona Ridgel   Your physician recommends that you continue on your current medications as directed. Please refer to the Current Medication list given to you today.

## 2013-09-16 LAB — MDC_IDC_ENUM_SESS_TYPE_INCLINIC
Battery Impedance: 100 Ohm
Brady Statistic AP VP Percent: 12 %
Brady Statistic AS VS Percent: 63 %
Lead Channel Impedance Value: 560 Ohm
Lead Channel Pacing Threshold Amplitude: 0.5 V
Lead Channel Pacing Threshold Pulse Width: 0.4 ms
Lead Channel Pacing Threshold Pulse Width: 0.4 ms
Lead Channel Sensing Intrinsic Amplitude: 2 mV
Lead Channel Sensing Intrinsic Amplitude: 5.6 mV
Lead Channel Setting Pacing Amplitude: 3.5 V
Lead Channel Setting Pacing Pulse Width: 0.4 ms
Lead Channel Setting Sensing Sensitivity: 2 mV

## 2013-09-22 ENCOUNTER — Telehealth: Payer: Self-pay | Admitting: Internal Medicine

## 2013-09-22 NOTE — Telephone Encounter (Signed)
Patient states she has had a pacemaker placed and cannot have an MRI now. She wants to have Dr. Rhea Belton call her to discuss what she needs to do. She will be home late this afternoon.

## 2013-09-24 NOTE — Telephone Encounter (Signed)
I called patient by phone and we talked about her recent episode of complete heart block leading to a fall. She was admitted and had a pacemaker placed. She is feeling much better. No GI complaints including no change in her bowel habit or rectal bleeding. She denies abdominal pain She remains adamant that she does not want repeat colonoscopy or flexible sigmoidoscopy She did ask about MRI to further characterize liver lesion, but she also repeats she would not want any biopsy of a liver lesion.  Given her continued desire to have nothing invasive, no repeat imaging is recommended at this time. She understands this recommendation She thanked me for the call and will call us with any questions or concerns

## 2013-09-24 NOTE — Telephone Encounter (Signed)
Dr Rhea Belton call the pt.

## 2013-10-08 ENCOUNTER — Encounter: Payer: Self-pay | Admitting: Internal Medicine

## 2013-10-09 ENCOUNTER — Encounter: Payer: Self-pay | Admitting: Internal Medicine

## 2013-10-09 ENCOUNTER — Ambulatory Visit (INDEPENDENT_AMBULATORY_CARE_PROVIDER_SITE_OTHER): Payer: Medicare Other | Admitting: Internal Medicine

## 2013-10-09 ENCOUNTER — Other Ambulatory Visit (INDEPENDENT_AMBULATORY_CARE_PROVIDER_SITE_OTHER): Payer: Medicare Other

## 2013-10-09 VITALS — BP 160/60 | HR 84 | Ht 65.0 in | Wt 147.8 lb

## 2013-10-09 DIAGNOSIS — K7689 Other specified diseases of liver: Secondary | ICD-10-CM

## 2013-10-09 DIAGNOSIS — K769 Liver disease, unspecified: Secondary | ICD-10-CM

## 2013-10-09 DIAGNOSIS — D128 Benign neoplasm of rectum: Secondary | ICD-10-CM | POA: Diagnosis not present

## 2013-10-09 DIAGNOSIS — R160 Hepatomegaly, not elsewhere classified: Secondary | ICD-10-CM

## 2013-10-09 DIAGNOSIS — D375 Neoplasm of uncertain behavior of rectum: Secondary | ICD-10-CM

## 2013-10-09 LAB — CBC
MCHC: 33.9 g/dL (ref 30.0–36.0)
Platelets: 313 10*3/uL (ref 150.0–400.0)
RBC: 4.23 Mil/uL (ref 3.87–5.11)
WBC: 6.8 10*3/uL (ref 4.5–10.5)

## 2013-10-09 LAB — COMPREHENSIVE METABOLIC PANEL
AST: 22 U/L (ref 0–37)
Albumin: 4.2 g/dL (ref 3.5–5.2)
BUN: 16 mg/dL (ref 6–23)
CO2: 28 mEq/L (ref 19–32)
Calcium: 10.2 mg/dL (ref 8.4–10.5)
Chloride: 105 mEq/L (ref 96–112)
GFR: 61.96 mL/min (ref >60.00)
Glucose, Bld: 104 mg/dL — ABNORMAL HIGH (ref 70–99)
Potassium: 4.5 mEq/L (ref 3.5–5.1)
Sodium: 141 mEq/L (ref 135–145)
Total Bilirubin: 0.7 mg/dL (ref 0.3–1.2)

## 2013-10-09 NOTE — Progress Notes (Signed)
Subjective:    Patient ID: [name], female    DOB: 24-Feb-1928, 79 y.o.   MRN: [id number]  HPI [name] is an 79 yo female with PMH of hypothyroidism, back pain, diverticulosis with an episode of diverticulitis, large flatline early spreading villous adenoma of the rectum, and liver lesions of unclear etiology who is seen in follow-up.   She was last seen in July 2014 at which time she had continued to adamantly did not want any further attempt at flexible sigmoidoscopy or colonoscopy nor any other invasive measure.  She reports she has been doing well but wants to know if the liver lesion can be reimaged at this time. She feels strongly that the knowledge of whether this is growing or becoming "worse" would help her make decisions regarding "other things that need to do" such as selling one of her homes and making preparations should she become ill.  She reports a good appetite. No nausea or vomiting. No change in bowel habits. No diarrhea or constipation. No rectal bleeding or melena. No abdominal or rectal pain. She denies significant weight loss.  Since her last visit she was found to have complete heart block and was hospitalized for pacemaker placement. Since having her pacemaker placed she reports she has been feeling well.    she says again that they very clearly "I do not want to be cut or probed in anyway"    Review of Systems As per history of present illness, otherwise negative  Current Medications, Allergies, Past Medical History, Past Surgical History, Family History and Social History were reviewed in [employer] record.     Objective:   Physical Exam BP 160/60  Pulse 84  Ht 5\' 5"  (1.651 m)  Wt 147 lb 12.8 oz (67.042 kg)  BMI 24.60 kg/m2 Constitutional: Well-developed and well-nourished. No distress. HEENT: Normocephalic and atraumatic. Oropharynx is clear and moist. No oropharyngeal exudate. Conjunctivae are normal.  No scleral  icterus. Neck: Neck supple. Trachea midline. Cardiovascular: Normal rate, regular rhythm and intact distal pulses. 2/6 SEM RUSB Pulmonary/chest: Effort normal and breath sounds normal. No wheezing, rales or rhonchi. Abdominal: Soft, nontender, nondistended. Bowel sounds active throughout.  Extremities: no clubbing, cyanosis, or edema Neurological: Alert and oriented to person place and time. Skin: Skin is warm and dry. No rashes noted. Psychiatric: Normal mood and affect. Behavior is normal.  CT ABDOMEN AND PELVIS WITH CONTRAST ----  June 2014   Technique:  Multidetector CT imaging of the abdomen and pelvis was performed following the standard protocol during bolus administration of intravenous contrast.   Contrast: OMNIPAQUE IOHEXOL 300 MG/ML  SOLN   Comparison: 01/16/2013   Findings: 7 mm subpleural nodule at the left lung base (series 3/image 7), unchanged, favored to be benign.   2.5 x 2.8 cm hypoenhancing lesion in the lateral segment left hepatic lobe (series 2/image 11), indeterminate but unchanged. While metastasis is possible, this also could reflect a benign lesion such as a hemangioma.   Spleen, pancreas, and adrenal glands are within normal limits.   Gallbladder is unremarkable.  No intrahepatic or extrahepatic ductal dilatation.   Kidneys are within normal limits.  No hydronephrosis.   No evidence of bowel obstruction. Mild eccentric wall thickening along the left lateral aspect of the rectum (series 2/image 73), without definite underlying mass.  Prior wall thickening/pericolonic inflammatory changes involving the distal sigmoid colon have resolved.  Sigmoid diverticulosis, without associated inflammatory changes.   Atherosclerotic calcifications of the abdominal  aorta and branch vessels.   No abdominopelvic ascites.   No suspicious abdominopelvic lymphadenopathy.   Status post hysterectomy.  Bilateral ovaries are unremarkable.   Bladder is  within normal limits.   Mild degenerative changes of the visualized thoracolumbar spine. Grade 1 anterolisthesis of L3 on L4 and L4 on L5.   IMPRESSION: No definite colonic mass is seen.  Mild eccentric wall thickening involving the rectum, possibly corresponding to known rectal polyp.   Sigmoid diverticulosis.  Prior pericolonic wall thickening/inflammatory changes involving the distal sigmoid colon have resolved.   Stable 2.8 cm hypoenhancing lesion in the lateral segment left hepatic lobe, indeterminate.  Benign etiologies such as a hemangioma remain possible.  Consider MRI abdomen with/without contrast for further evaluation.   7 mm subpleural nodule at the left lung base, unchanged, favored to be benign.      Assessment & Plan:  79 yo female with PMH of hypothyroidism, back pain, diverticulosis with an episode of diverticulitis, large flatline early spreading villous adenoma of the rectum, and liver lesions of unclear etiology who is seen in follow-up.   1.  Rectal villous adenoma/liver lesion of uncertain nature -- we had a  long discussion again today regarding her known rectal polyp, which we have discussed several times is known to be precancerous with high-risk architecture. She has continuously not wanted to have this reevaluated or resected. She has asked to repeat the CT scan to evaluate the known liver lesion for stability. She feels strongly that this would help her in planning. She also understands after discussion today that we did not previously repeat the CT scan, because she did not want anything invasive or diagnostic performed if the lesion was increasing in size.  She continues to feel this way, and despite this fact she reports she would still rest easier with repeat imaging. This in mind we will repeat CT scan of the abdomen and pelvis with contrast. CBC and CMP today. --She is asked to contact me with any issues including abdominal pain, change in bowel habits,  rectal bleeding. She voices understanding

## 2013-10-09 NOTE — Patient Instructions (Signed)
Your physician has requested that you go to the basement for the following lab work before leaving today: CBC, CMP    You have been scheduled for a CT scan of the abdomen and pelvis at [facility] ([address]---this is in the same building as Architectural technologist).   You are scheduled on 10/15/2013 at 11:30. You should arrive 15 minutes prior to your appointment time for registration. Please follow the written instructions below on the day of your exam:  WARNING: IF YOU ARE ALLERGIC TO IODINE/X-RAY DYE, PLEASE NOTIFY RADIOLOGY IMMEDIATELY AT [phone]! YOU WILL BE GIVEN A 13 HOUR PREMEDICATION PREP.  1) Do not eat or drink anything after 7:30 (4 hours prior to your test) 2) You have been given 2 bottles of oral contrast to drink. The solution may taste  better if refrigerated, but do NOT add ice or any other liquid to this solution. Shake well before drinking.    Drink 1 bottle of contrast @ 9:30 (2 hours prior to your exam)  Drink 1 bottle of contrast @ 10:30 (1 hour prior to your exam)  You may take any medications as prescribed with a small amount of water except for the following: Metformin, Glucophage, Glucovance, Avandamet, Riomet, Fortamet, Actoplus Met, Janumet, Glumetza or Metaglip. The above medications must be held the day of the exam AND 48 hours after the exam.  The purpose of you drinking the oral contrast is to aid in the visualization of your intestinal tract. The contrast solution may cause some diarrhea. Before your exam is started, you will be given a small amount of fluid to drink. Depending on your individual set of symptoms, you may also receive an intravenous injection of x-ray contrast/dye. Plan on being at [facility] for 30 minutes or long, depending on the type of exam you are having performed.  If you have any questions regarding your exam or if you need to reschedule, you may call the CT department at [phone] between the hours of 8:00 am  and 5:00 pm, Monday-Friday.  ________________________________________________________________________

## 2013-10-15 ENCOUNTER — Ambulatory Visit (INDEPENDENT_AMBULATORY_CARE_PROVIDER_SITE_OTHER)
Admission: RE | Admit: 2013-10-15 | Discharge: 2013-10-15 | Disposition: A | Discharge status: Home / Self Care | Payer: Medicare Other | Source: Ambulatory Visit | Attending: Internal Medicine | Admitting: Internal Medicine

## 2013-10-15 DIAGNOSIS — K7689 Other specified diseases of liver: Secondary | ICD-10-CM | POA: Diagnosis not present

## 2013-10-15 DIAGNOSIS — K769 Liver disease, unspecified: Secondary | ICD-10-CM

## 2013-10-15 MED ORDER — IOHEXOL 300 MG/ML  SOLN
100.0000 mL | Freq: Once | INTRAMUSCULAR | Status: AC | PRN
  Administered 2013-10-15: 100 mL via INTRAVENOUS

## 2013-10-17 ENCOUNTER — Telehealth: Payer: Self-pay | Admitting: *Deleted

## 2013-10-17 DIAGNOSIS — R918 Other nonspecific abnormal finding of lung field: Secondary | ICD-10-CM

## 2013-10-17 DIAGNOSIS — R933 Abnormal findings on diagnostic imaging of other parts of digestive tract: Secondary | ICD-10-CM

## 2013-10-17 NOTE — Telephone Encounter (Signed)
Message copied by Florene Glen on Fri Oct 17, 2013  4:01 PM ------      Message from: Beverley Fiedler      Created: Wed Oct 15, 2013  2:54 PM       The liver lesion previously seen is stable      This scan showed more of the lung bases, and showed a likely mass in the right lower lung.      She needs a chest CT with contrast to further understand this lesion      The scan shows further thickening of the rectum where she has a known precancerous colon polyp, indicating this lesion is likely increasing in size      Further recommendations after CT chest ------

## 2013-10-17 NOTE — Telephone Encounter (Signed)
Informed pt of results and we need a better look at her r lung. Scheduled a CT for 10/21/13 at 2pm. She needs to be NPO after 12N; pt stated understanding.

## 2013-10-21 ENCOUNTER — Ambulatory Visit (INDEPENDENT_AMBULATORY_CARE_PROVIDER_SITE_OTHER)
Admission: RE | Admit: 2013-10-21 | Discharge: 2013-10-21 | Disposition: A | Discharge status: Home / Self Care | Payer: Medicare Other | Source: Ambulatory Visit | Attending: Internal Medicine | Admitting: Internal Medicine

## 2013-10-21 DIAGNOSIS — R222 Localized swelling, mass and lump, trunk: Secondary | ICD-10-CM | POA: Diagnosis not present

## 2013-10-21 DIAGNOSIS — R933 Abnormal findings on diagnostic imaging of other parts of digestive tract: Secondary | ICD-10-CM

## 2013-10-21 DIAGNOSIS — R918 Other nonspecific abnormal finding of lung field: Secondary | ICD-10-CM

## 2013-10-21 DIAGNOSIS — R911 Solitary pulmonary nodule: Secondary | ICD-10-CM | POA: Diagnosis not present

## 2013-10-21 MED ORDER — IOHEXOL 300 MG/ML  SOLN
80.0000 mL | Freq: Once | INTRAMUSCULAR | Status: AC | PRN
  Administered 2013-10-21: 80 mL via INTRAVENOUS

## 2013-11-06 ENCOUNTER — Encounter: Payer: Self-pay | Admitting: Internal Medicine

## 2013-11-10 ENCOUNTER — Encounter: Payer: Self-pay | Admitting: Internal Medicine

## 2013-11-10 ENCOUNTER — Ambulatory Visit (INDEPENDENT_AMBULATORY_CARE_PROVIDER_SITE_OTHER): Payer: Medicare Other | Admitting: Internal Medicine

## 2013-11-10 VITALS — BP 148/70 | HR 80 | Ht 63.5 in | Wt 148.2 lb

## 2013-11-10 DIAGNOSIS — D129 Benign neoplasm of anus and anal canal: Secondary | ICD-10-CM

## 2013-11-10 DIAGNOSIS — K621 Rectal polyp: Secondary | ICD-10-CM

## 2013-11-10 DIAGNOSIS — Q248 Other specified congenital malformations of heart: Secondary | ICD-10-CM

## 2013-11-10 DIAGNOSIS — K769 Liver disease, unspecified: Secondary | ICD-10-CM

## 2013-11-10 DIAGNOSIS — D128 Benign neoplasm of rectum: Secondary | ICD-10-CM | POA: Diagnosis not present

## 2013-11-10 DIAGNOSIS — K7689 Other specified diseases of liver: Secondary | ICD-10-CM | POA: Diagnosis not present

## 2013-11-10 DIAGNOSIS — D375 Neoplasm of uncertain behavior of rectum: Secondary | ICD-10-CM

## 2013-11-10 DIAGNOSIS — K620 Anal polyp: Secondary | ICD-10-CM | POA: Diagnosis not present

## 2013-11-10 NOTE — Progress Notes (Signed)
Subjective:    Patient ID: TAMORA HUNEKE, female    DOB: 11-Jul-1928, 80 y.o.   MRN: [id number]  HPI Mrs. Cotham is an 80 yo female with PMH of hypothyroidism, back pain, diverticulosis with an episode of diverticulitis, large flat, laterally spreading villous adenoma of the rectum, and liver lesion of unclear etiology who is seen in follow-up. She was last seen in Dec 2014. After her recent office visit, a CT scan of the abdomen and pelvis was performed to evaluate the previously seen liver lesion abutting determine significance. This was at her request because she was worried about the lesion. A CT scan of the abdomen and pelvis showed a stable liver lesion, increased rectal thickening, and is concerning for a lung mass. Dedicated lung CT was then performed which showed a probable pericardial cyst along with a stable 7 mm left lower lobe pleural parenchymal nodule which was felt to be most likely benign. Followup imaging was recommended in 2 years  She returns today to discuss in person, the results of the scans. She continues to feel well. Normal bowel movements without abdominal pain. No blood in her stool or melena. No diarrhea or constipation. No rectal pain. No tenesmus. No weight loss. Appetite.  Review of Systems As per history of present illness, Otherwise negative  Current Medications, Allergies, Past Medical History, Past Surgical History, Family History and Social History were reviewed in [employer] record.     Objective:   Physical Exam BP 148/70  Pulse 80  Ht 5' 3.5" (1.613 m)  Wt 148 lb 4 oz (67.246 kg)  BMI 25.85 kg/m2 Constitutional: Well-developed and well-nourished. No distress. HEENT: Normocephalic and atraumatic. Oropharynx is clear and moist. No oropharyngeal exudate. Conjunctivae are normal.  No scleral icterus. Neck: Neck supple. Trachea midline. Cardiovascular: Normal rate, regular rhythm and intact distal pulses. No  M/R/G Pulmonary/chest: Effort normal and breath sounds normal. No wheezing, rales or rhonchi. Abdominal: Soft, nontender, nondistended. Bowel sounds active throughout. There are no masses palpable. No hepatosplenomegaly. Extremities: no clubbing, cyanosis, or edema Lymphadenopathy: No cervical adenopathy noted. Neurological: Alert and oriented to person place and time. Skin: Skin is warm and dry. No rashes noted. Psychiatric: Normal mood and affect. Behavior is normal.  Imaging from December 2014 CT ABDOMEN AND PELVIS WITH CONTRAST   TECHNIQUE: Multidetector CT imaging of the abdomen and pelvis was performed using the standard protocol following bolus administration of intravenous contrast.   CONTRAST:  159mL OMNIPAQUE IOHEXOL 300 MG/ML  SOLN   COMPARISON:  04/18/2013.  01/16/2013.   FINDINGS: Lung Bases: There is an intermediate attenuation mass partially visualized along the mediastinal border of the anterior right lung base. This measures at least 33 mm transverse. Attenuation is about 28 Hounsfield units. This was not radiographically evident on prior radiographs and the prior CT did not include the lung bases. Followup chest CT with contrast is recommended. Pacemaker leads partially visualized. 7 mm peripheral left lower lobe nodule abutting the pleura is unchanged.   Liver: Unchanged hypo attenuating lesion in the lateral left hepatic lobe measuring 28 mm x 24 mm.   Spleen:  Normal.   Gallbladder:  Normal.   Common bile duct:  Normal.   Pancreas:  Normal.   Adrenal glands:  Normal bilaterally.   Kidneys: Normal enhancement and delayed excretion of contrast. Tiny bilateral subcentimeter low-density lesions likely represent renal cysts.   Stomach:  Small hiatal hernia.   Small bowel: Duodenum appears normal. Small bowel all appears  within normal limits. No obstruction, mass lesion are mesenteric adenopathy.   Colon: Colonic diverticulosis. Eccentric mural  thickening in the rectum is again noted, slightly more prominent than the recent CT of June 2014. Tiny appendiceal stump noted.   Pelvic Genitourinary: Urinary bladder appears normal. Hysterectomy. No free fluid.   Bones: No aggressive osseous lesions. Levoconvex lumbar curve. Anterolisthesis of L3 on L4 and L4 on L5 appears degenerative. Ossification the L5-S1 disc.   Vasculature: Atherosclerosis with mural plaque. No aneurysm or acute vascular abnormality.   Body Wall: Spigelian hernia on the left again noted. Tiny fat containing periumbilical hernia.   IMPRESSION: 1. Unchanged 28 mm lateral segment left hepatic lobe hypo attenuating/hypervascular lesion. 2. Today's exam included more of the lung bases than previous and there is a mass in the inferior anterior right hemithorax with intermediate attenuation. Followup chest CT with infusion recommended. These results will be called to the ordering clinician or representative by the Radiologist Assistant, and communication documented in the PACS Dashboard. 3. Eccentric mural thickening of the rectum appears more prominent than on the prior exam suggesting interval growth. 4. Fat containing left Spigelian hernia. ________________________________________________________________________________________  CT CHEST WITH CONTRAST   TECHNIQUE: Multidetector CT imaging of the chest was performed during intravenous contrast administration.   CONTRAST:  44mL OMNIPAQUE IOHEXOL 300 MG/ML  SOLN   COMPARISON:  CT abdomen/ pelvis 10/15/2013 with incomplete imaging at the lung bases. No prior chest CT is available for comparison. Previous CT abdomen/ pelvis did not include the lung bases has a similar anatomic level for less recent comparison.   FINDINGS: Artifact from left-sided pacer in place. Within the right antral lateral pericardial space is a fluid density oval mass measuring 2.9 x 2.3 cm, corresponding to that seen on the prior  exam. No pericardial or pleural effusion. Heart size is mildly enlarged with right atrial prominence. No lymphadenopathy. Great vessels are normal in caliber. Moderate atheromatous aortic calcification is identified thoracic inlet vascular collaterals incidentally noted. Leftward thoracic spinal curvature is reidentified. 7 mm left lower lobe pleural parenchymal nodule is stable since the least recent similar comparison exam 01/16/2013. No new pulmonary nodule, mass, or consolidation. Remote right anterior 4th and 5th rib fractures are identified.   IMPRESSION: Fluid density probable pericardial cyst, corresponding to the finding seen on the previous recent exam. Bronchogenic cyst or loculated pleural fluid is felt less likely.   Stable 7 mm left lower lobe pleural parenchymal nodule. Nodules in this location are most likely benign. Consider followup chest CT March 2016 which would demonstrate 2 year stability from the original prior exam 01/16/2013 and establish probable benignity. The probable pericardial cyst could then be reassessed at that time as well.       Assessment & Plan:   80 yo female with PMH of hypothyroidism, back pain, diverticulosis with an episode of diverticulitis, large flat, laterally spreading villous adenoma of the rectum, and liver lesion of unclear etiology who is seen in follow-up.  1.  Liver lesion -- unchanged in size of the 2.8 x 2.4 cm liver lesion in the left lobe. Given that this has been stable, a benign etiology is felt most likely. We discussed the CT findings today at length and she understands the recent imaging results  2.  Chest lesion/pericardial cyst -- fortunately the CT chest did not show evidence of lung cancer or metastatic disease. The lesion seen by the abdominal CT is felt to be likely a pericardial cyst which would be felt to  be benign. She also has a 7 mm left lower lobe pleural nodule which is also stable. Radiology recommended  repeating a chest CT in 2 years for surveillance, and this will be performed assuming she continues to do well.  She understands this imaging test and is reassured after discussing the findings  3.  Rectal villous adenoma -- her rectal villous adenoma continues to be her biggest issue, though despite multiple discussions with me about this lesion in the past, she has adamantly not wanted surgery for resection. I have discussed with her the CT shows further thickening in the rectum which very likely corresponds to increasing size of this lesion.  She continues to feel well and from a GI standpoint is asymptomatic, and this has prompted me to continue to offer her at least a discussion about resection and what this would mean for her both from a surgery standpoint and a recovery standpoint.  She continues to feel strongly that she likely would not want any "cutting operation", but also is open to meeting with a surgeon to discuss options.  With this in mind, I will refer her to Dr. Leighton Ruff to discuss what transanal excision would involve for her.  This referral will be placed today  I will see her back after she meets with surgery

## 2013-11-10 NOTE — Patient Instructions (Signed)
You have been referred to [facility] to see Dr. Leighton Ruff. They will contact you within the next day or two. If you do not hear from them please call our office and we will follow up with them

## 2013-11-11 ENCOUNTER — Telehealth: Payer: Self-pay | Admitting: Gastroenterology

## 2013-11-11 NOTE — Telephone Encounter (Signed)
Spoke to [name] about her upcoming appointment with Dr. Leighton Ruff @ [facility]. Gave her date and time.  11/26/2013 @ 1:30 [name] verbalized understanding

## 2013-11-26 ENCOUNTER — Encounter (INDEPENDENT_AMBULATORY_CARE_PROVIDER_SITE_OTHER): Payer: Self-pay

## 2013-11-26 ENCOUNTER — Encounter (INDEPENDENT_AMBULATORY_CARE_PROVIDER_SITE_OTHER): Payer: Self-pay | Admitting: General Surgery

## 2013-11-26 ENCOUNTER — Ambulatory Visit (INDEPENDENT_AMBULATORY_CARE_PROVIDER_SITE_OTHER): Payer: Medicare Other | Admitting: General Surgery

## 2013-11-26 VITALS — BP 140/62 | HR 90 | Temp 98.0°F | Resp 24 | Ht 65.0 in | Wt 147.0 lb

## 2013-11-26 DIAGNOSIS — D128 Benign neoplasm of rectum: Secondary | ICD-10-CM

## 2013-11-26 DIAGNOSIS — D129 Benign neoplasm of anus and anal canal: Secondary | ICD-10-CM

## 2013-11-26 NOTE — Progress Notes (Signed)
Chief Complaint  Patient presents with  . New Evaluation    eval rectal polyp    HISTORY: [name] is a 80 y.o. female who presents to the office with a near circumferential distal rectal mass that extends from the first valve distally.  This was found on colonoscopy.  Biopsy shows low grade dysplasia.. She denies rectal bleeding, changes in bowel habits and weight loss.  Past Medical History  Diagnosis Date  . Back pain   . UTI (urinary tract infection)   . Hypothyroid   . Diverticulitis   . Liver mass   . Rectal polyp       Past Surgical History  Procedure Laterality Date  . Abdominal hysterectomy      still has ovaries  . Flexible sigmoidoscopy N/A 01/17/2013    Procedure: FLEXIBLE SIGMOIDOSCOPY;  Surgeon: [name], MD;  Location: [facility];  Service: Gastroenterology;  Laterality: N/A;  . Pacemaker placement  09/01/2013  . Inner ear surgery  1970's    due to infection      Current Outpatient Prescriptions  Medication Sig Dispense Refill  . acetaminophen (TYLENOL) 325 MG tablet Take 1-2 tablets (325-650 mg total) by mouth every 6 (six) hours as needed (pain).      . Multiple Vitamin (MULTIVITAMIN WITH MINERALS) TABS Take 1 tablet by mouth daily.      Marland Kitchen SYNTHROID 112 MCG tablet Take 1 tablet (112 mcg total) by mouth daily before breakfast.  90 tablet  3   No current facility-administered medications for this visit.      No Known Allergies    Family History  Problem Relation Age of Onset  . Breast cancer Sister   . Cancer Sister     breast  . Diabetes Sister   . Heart disease Sister   . Heart disease Mother   . Colon cancer Neg Hx   . Heart disease Father       History   Social History  . Marital Status: Widowed    Spouse Name: N/A    Number of Children: 1  . Years of Education: N/A   Occupational History  . retired    Social History Main Topics  . Smoking status: Never Smoker   . Smokeless tobacco: Never Used  . Alcohol Use: No  .  Drug Use: No  . Sexual Activity: None   Other Topics Concern  . None   Social History Narrative  . None       REVIEW OF SYSTEMS - PERTINENT POSITIVES ONLY: Review of Systems - General ROS: negative for - chills or fever Respiratory ROS: no cough, shortness of breath, or wheezing Cardiovascular ROS: no chest pain or dyspnea on exertion Gastrointestinal ROS: no abdominal pain, change in bowel habits, or black or bloody stools  EXAM: Filed Vitals:   11/26/13 1342  BP: 140/62  Pulse: 90  Temp: 98 F (36.7 C)  Resp: 24    General appearance: alert Pt refused physical exam   Anorectal Exam: pt refused     ASSESSMENT AND PLAN: I spent > 50% of time counseling the patient.  Ultimately she refused a physical exam and therefore I am unable to give any recommendations for surgical resection. She understands that her polyp could lead to cancer and would like to wait until she is having symptoms to discuss treatment.  I told her that these polyps can sometimes turn into cancer without any symptoms.  I believe she understands this completely and is  still refusing care.    Rosario Adie, MD Colon and Rectal Surgery / [facility]      Visit Diagnoses: No diagnosis found.  Primary Care Physician: [name], MD

## 2013-12-09 ENCOUNTER — Encounter: Payer: Self-pay | Admitting: Internal Medicine

## 2013-12-09 ENCOUNTER — Ambulatory Visit (INDEPENDENT_AMBULATORY_CARE_PROVIDER_SITE_OTHER): Payer: Medicare Other | Admitting: Internal Medicine

## 2013-12-09 VITALS — BP 138/62 | HR 96 | Ht 65.0 in | Wt 148.0 lb

## 2013-12-09 DIAGNOSIS — Z8679 Personal history of other diseases of the circulatory system: Secondary | ICD-10-CM

## 2013-12-09 DIAGNOSIS — R55 Syncope and collapse: Secondary | ICD-10-CM | POA: Diagnosis not present

## 2013-12-09 DIAGNOSIS — Z950 Presence of cardiac pacemaker: Secondary | ICD-10-CM | POA: Diagnosis not present

## 2013-12-09 DIAGNOSIS — I459 Conduction disorder, unspecified: Secondary | ICD-10-CM | POA: Diagnosis not present

## 2013-12-09 NOTE — Assessment & Plan Note (Signed)
She has had no additional near syncope. She will undergo watchful waiting.

## 2013-12-09 NOTE — Patient Instructions (Signed)
Remote monitoring is used to monitor your pacemaker from home. This monitoring reduces the number of office visits required to check your device to one time per year. It allows [place] to keep an eye on the functioning of your device to ensure it is working properly. You are scheduled for a device check from home on 03-12-2014. You may send your transmission at any time that day. If you have a wireless device, the transmission will be sent automatically. After your physician reviews your transmission, you will receive a postcard with your next transmission date.  Your physician recommends that you schedule a follow-up appointment in: November 2015 with Dr.Taylor

## 2013-12-09 NOTE — Progress Notes (Signed)
      HPI [name] returns today for followup. She is a pleasant 80 yo woman with CHB, s/p PPM 3 months ago. In the interim, she has been stable. She denies chest pain or sob. No syncope. No edema. She has minimal soreness at her PPM insertion site. No Known Allergies   Current Outpatient Prescriptions  Medication Sig Dispense Refill  . aspirin 81 MG tablet Take 81 mg by mouth as needed for pain.      . Multiple Vitamin (MULTIVITAMIN WITH MINERALS) TABS Take 1 tablet by mouth daily.      Marland Kitchen SYNTHROID 112 MCG tablet Take 1 tablet (112 mcg total) by mouth daily before breakfast.  90 tablet  3   No current facility-administered medications for this visit.     Past Medical History  Diagnosis Date  . Back pain   . UTI (urinary tract infection)   . Hypothyroid   . Diverticulitis   . Liver mass   . Rectal polyp     ROS:   All systems reviewed and negative except as noted in the HPI.   Past Surgical History  Procedure Laterality Date  . Abdominal hysterectomy      still has ovaries  . Flexible sigmoidoscopy N/A 01/17/2013    Procedure: FLEXIBLE SIGMOIDOSCOPY;  Surgeon: [name], MD;  Location: [facility];  Service: Gastroenterology;  Laterality: N/A;  . Pacemaker placement  09/01/2013  . Inner ear surgery  1970's    due to infection     Family History  Problem Relation Age of Onset  . Breast cancer Sister   . Cancer Sister     breast  . Diabetes Sister   . Heart disease Sister   . Heart disease Mother   . Colon cancer Neg Hx   . Heart disease Father      History   Social History  . Marital Status: Widowed    Spouse Name: N/A    Number of Children: 1  . Years of Education: N/A   Occupational History  . retired    Social History Main Topics  . Smoking status: Never Smoker   . Smokeless tobacco: Never Used  . Alcohol Use: No  . Drug Use: No  . Sexual Activity: Not on file   Other Topics Concern  . Not on file   Social History Narrative    . No narrative on file     BP 138/62  Pulse 96  Ht 5\' 5"  (1.651 m)  Wt 148 lb (67.132 kg)  BMI 24.63 kg/m2  Physical Exam:  Well appearing elderly woman, NAD HEENT: Unremarkable Neck:  No JVD, no thyromegally Back:  No CVA tenderness Lungs:  Clear with no wheezes HEART:  Regular rate rhythm, 2/6 systolic murmurs, no rubs, no clicks Abd:  soft, positive bowel sounds, no organomegally, no rebound, no guarding Ext:  2 plus pulses, no edema, no cyanosis, no clubbing Skin:  No rashes no nodules Neuro:  CN II through XII intact, motor grossly intact  EKG - P synchronous ventricular pacing.  DEVICE  Normal device function.  See PaceArt for details.   Assess/Plan:

## 2013-12-09 NOTE — Assessment & Plan Note (Signed)
Her Medtronic DDD PM is working normally. Will recheck in several months. 

## 2013-12-10 LAB — MDC_IDC_ENUM_SESS_TYPE_INCLINIC
Battery Impedance: 100 Ohm
Battery Remaining Longevity: 141 mo
Battery Voltage: 2.8 V
Brady Statistic AP VP Percent: 7 %
Brady Statistic AP VS Percent: 0 %
Brady Statistic AS VP Percent: 68 %
Brady Statistic AS VS Percent: 24 %
Date Time Interrogation Session: 20150210203817
Lead Channel Impedance Value: 418 Ohm
Lead Channel Impedance Value: 552 Ohm
Lead Channel Pacing Threshold Amplitude: 0.5 V
Lead Channel Pacing Threshold Amplitude: 0.75 V
Lead Channel Pacing Threshold Pulse Width: 0.4 ms
Lead Channel Pacing Threshold Pulse Width: 0.4 ms
Lead Channel Sensing Intrinsic Amplitude: 2.8 mV
Lead Channel Sensing Intrinsic Amplitude: 5.6 mV
Lead Channel Setting Pacing Amplitude: 2 V
Lead Channel Setting Pacing Amplitude: 2.5 V
Lead Channel Setting Pacing Pulse Width: 0.4 ms
Lead Channel Setting Sensing Sensitivity: 2 mV

## 2014-01-26 ENCOUNTER — Encounter: Payer: Self-pay | Admitting: Family Medicine

## 2014-01-26 ENCOUNTER — Ambulatory Visit (INDEPENDENT_AMBULATORY_CARE_PROVIDER_SITE_OTHER): Payer: Medicare Other | Admitting: Family Medicine

## 2014-01-26 VITALS — BP 133/73 | HR 94 | Temp 99.2°F | Ht 65.0 in | Wt 145.0 lb

## 2014-01-26 DIAGNOSIS — R011 Cardiac murmur, unspecified: Secondary | ICD-10-CM | POA: Insufficient documentation

## 2014-01-26 DIAGNOSIS — Z780 Asymptomatic menopausal state: Secondary | ICD-10-CM

## 2014-01-26 DIAGNOSIS — E039 Hypothyroidism, unspecified: Secondary | ICD-10-CM | POA: Diagnosis not present

## 2014-01-26 DIAGNOSIS — Z0000 Encounter for general adult medical examination without abnormal findings: Secondary | ICD-10-CM

## 2014-01-26 DIAGNOSIS — K769 Liver disease, unspecified: Secondary | ICD-10-CM

## 2014-01-26 DIAGNOSIS — R160 Hepatomegaly, not elsewhere classified: Secondary | ICD-10-CM

## 2014-01-26 DIAGNOSIS — E559 Vitamin D deficiency, unspecified: Secondary | ICD-10-CM | POA: Diagnosis not present

## 2014-01-26 DIAGNOSIS — Z8679 Personal history of other diseases of the circulatory system: Secondary | ICD-10-CM | POA: Diagnosis not present

## 2014-01-26 DIAGNOSIS — Z950 Presence of cardiac pacemaker: Secondary | ICD-10-CM

## 2014-01-26 LAB — POCT CBC
Granulocyte percent: 81.2 %G — AB (ref 37–80)
HEMATOCRIT: 42.6 % (ref 37.7–47.9)
HEMOGLOBIN: 13.4 g/dL (ref 12.2–16.2)
Lymph, poc: 1.8 (ref 0.6–3.4)
MCH: 30.1 pg (ref 27–31.2)
MCHC: 31.5 g/dL — AB (ref 31.8–35.4)
MCV: 95.6 fL (ref 80–97)
MPV: 8.1 fL (ref 0–99.8)
POC GRANULOCYTE: 9.3 — AB (ref 2–6.9)
POC LYMPH PERCENT: 15.5 %L (ref 10–50)
Platelet Count, POC: 275 10*3/uL (ref 142–424)
RBC: 4.5 M/uL (ref 4.04–5.48)
RDW, POC: 14.4 %
WBC: 11.5 10*3/uL — AB (ref 4.6–10.2)

## 2014-01-26 NOTE — Addendum Note (Signed)
Addended by: Zannie Cove on: 01/26/2014 11:56 AM   Modules accepted: Orders

## 2014-01-26 NOTE — Progress Notes (Signed)
Subjective:    Patient ID: [name], female    DOB: [birth year], 80 y.o.   MRN: [id number]  HPI Pt here for follow up and management of chronic medical problems. The patient had recent CT scans of chest abdomen and pelvis. She has a lung nodule which apparently is stable and a liver hypervascular area which appears to be stable. Please see this report for more information. Should her lab work today. She will also be given FOBT. She may be scheduled for a DEXA scan. The patient comes to the visit today with her good friend, [name].       Patient Active Problem List   Diagnosis Date Noted  . History of pacemaker 09/04/2013  . Heart block 08/31/2013  . Near syncope 08/29/2013  . Fall at home, with facial injury 08/29/2013  . UTI (lower urinary tract infection) 08/29/2013  . Sigmoid diverticulitis 04/23/2013  . Hypothyroidism 04/23/2013  . Chronic back pain 04/23/2013  . Villous adenoma of rectum 01/16/2013  . Mass of left lobe of liver 01/16/2013   Outpatient Encounter Prescriptions as of 01/26/2014  Medication Sig  . aspirin 81 MG tablet Take 81 mg by mouth as needed for pain.  . Multiple Vitamin (MULTIVITAMIN WITH MINERALS) TABS Take 1 tablet by mouth daily.  Marland Kitchen SYNTHROID 112 MCG tablet Take 1 tablet (112 mcg total) by mouth daily before breakfast.    Review of Systems  Constitutional: Negative.   HENT: Negative.   Eyes: Negative.   Respiratory: Negative.   Cardiovascular: Negative.   Gastrointestinal: Negative.   Endocrine: Negative.   Genitourinary: Negative.   Musculoskeletal: Negative.   Skin: Negative.   Allergic/Immunologic: Negative.   Neurological: Negative.   Hematological: Negative.   Psychiatric/Behavioral: Negative.        Objective:   Physical Exam  Nursing note and vitals reviewed. Constitutional: She is oriented to person, place, and time. She appears well-developed and well-nourished. No distress.  HENT:  Head: Normocephalic and atraumatic.    Right Ear: External ear normal.  Left Ear: External ear normal.  Nose: Nose normal.  Mouth/Throat: Oropharynx is clear and moist.  Eyes: Conjunctivae and EOM are normal. Pupils are equal, round, and reactive to light. Right eye exhibits no discharge. Left eye exhibits no discharge. No scleral icterus.  Neck: Normal range of motion. Neck supple. No thyromegaly present.  Cardiovascular: Normal rate, regular rhythm and intact distal pulses.  Exam reveals no gallop and no friction rub.   Murmur heard. At 84 per minute. Grade 3/6 systolic ejection murmur  Pulmonary/Chest: Effort normal and breath sounds normal. No respiratory distress. She has no wheezes. She has no rales. She exhibits no tenderness.  Abdominal: Soft. Bowel sounds are normal. She exhibits no mass. There is no tenderness. There is no rebound and no guarding.  Musculoskeletal: Normal range of motion. She exhibits no edema and no tenderness.  Lymphadenopathy:    She has no cervical adenopathy.  Neurological: She is alert and oriented to person, place, and time. She has normal reflexes. No cranial nerve deficit.  Skin: Skin is warm and dry.  Psychiatric: She has a normal mood and affect. Her behavior is normal. Judgment and thought content normal.   BP 133/73  Diaz 94  Temp(Src) 99.2 F (37.3 C) (Oral)  Ht _0  (1.651 m)  Wt 145 lb (65.772 kg)  BMI 24.13 kg/m2        Assessment & Plan:  1. Hypothyroidism - POCT CBC - Thyroid Panel With  TSH  2. Mass of left lobe of liver - POCT CBC -Continued followup by gastroenterologist, Dr. Hilarie Fredrickson  3. Health care maintenance - POCT CBC - BMP8+EGFR - Hepatic function panel - Lipid panel - Vit D  25 hydroxy (rtn osteoporosis monitoring)  4. History of pacemaker -Continue to followup with the cardiologist, Dr. Lovena Le  5. Systolic ejection murmur  Patient Instructions                       Medicare Annual Wellness Visit  [facility] and the medical providers at [facility] strive to bring you the best medical care.  In doing so we not only want to address your current medical conditions and concerns but also to detect new conditions early and prevent illness, disease and health-related problems.    Medicare offers a yearly Wellness Visit which allows our clinical staff to assess your need for preventative services including immunizations, lifestyle education, counseling to decrease risk of preventable diseases and screening for fall risk and other medical concerns.    This visit is provided free of charge (no copay) for all Medicare recipients. The clinical pharmacists at [facility] have begun to conduct these Wellness Visits which will also include a thorough review of all your medications.    As you primary medical provider recommend that you make an appointment for your Annual Wellness Visit if you have not done so already this year.  You may set up this appointment before you leave today or you may call back ([phone]) and schedule an appointment.  Please make sure when you call that you mention that you are scheduling your Annual Wellness Visit with the clinical pharmacist so that the appointment may be made for the proper length of time.      Continue current medications. Continue good therapeutic lifestyle changes which include good diet and exercise. Fall precautions discussed with patient. If an FOBT was given today- please return it to our front desk. If you are over 73 years old - you may need Prevnar 20 or the adult Pneumonia vaccine.  Keep followup appointments with the gastroenterologist and cardiologist Return the FOBT We will schedule you for a DEXA scan   Arrie Senate MD

## 2014-01-26 NOTE — Patient Instructions (Addendum)
Medicare Annual Wellness Visit  [facility] and the medical providers at [facility] strive to bring you the best medical care.  In doing so we not only want to address your current medical conditions and concerns but also to detect new conditions early and prevent illness, disease and health-related problems.    Medicare offers a yearly Wellness Visit which allows our clinical staff to assess your need for preventative services including immunizations, lifestyle education, counseling to decrease risk of preventable diseases and screening for fall risk and other medical concerns.    This visit is provided free of charge (no copay) for all Medicare recipients. The clinical pharmacists at [facility] have begun to conduct these Wellness Visits which will also include a thorough review of all your medications.    As you primary medical provider recommend that you make an appointment for your Annual Wellness Visit if you have not done so already this year.  You may set up this appointment before you leave today or you may call back ([phone]) and schedule an appointment.  Please make sure when you call that you mention that you are scheduling your Annual Wellness Visit with the clinical pharmacist so that the appointment may be made for the proper length of time.      Continue current medications. Continue good therapeutic lifestyle changes which include good diet and exercise. Fall precautions discussed with patient. If an FOBT was given today- please return it to our front desk. If you are over 11 years old - you may need Prevnar 71 or the adult Pneumonia vaccine.  Keep followup appointments with the gastroenterologist and cardiologist Return the FOBT We will schedule you for a DEXA scan

## 2014-01-27 LAB — THYROID PANEL WITH TSH
Free Thyroxine Index: 2.4 (ref 1.2–4.9)
T3 UPTAKE RATIO: 27 % (ref 24–39)
T4 TOTAL: 8.8 ug/dL (ref 4.5–12.0)
TSH: 3.33 u[IU]/mL (ref 0.450–4.500)

## 2014-01-27 LAB — HEPATIC FUNCTION PANEL
ALT: 10 IU/L (ref 0–32)
AST: 20 IU/L (ref 0–40)
Albumin: 4.3 g/dL (ref 3.5–4.7)
Alkaline Phosphatase: 85 IU/L (ref 39–117)
BILIRUBIN DIRECT: 0.21 mg/dL (ref 0.00–0.40)
TOTAL PROTEIN: 6.8 g/dL (ref 6.0–8.5)
Total Bilirubin: 0.7 mg/dL (ref 0.0–1.2)

## 2014-01-27 LAB — LIPID PANEL
CHOL/HDL RATIO: 2.8 ratio (ref 0.0–4.4)
Cholesterol, Total: 193 mg/dL (ref 100–199)
HDL: 69 mg/dL (ref >39)
LDL CALC: 105 mg/dL — AB (ref 0–99)
Triglycerides: 93 mg/dL (ref 0–149)
VLDL Cholesterol Cal: 19 mg/dL (ref 5–40)

## 2014-01-27 LAB — BMP8+EGFR
BUN / CREAT RATIO: 12 (ref 11–26)
BUN: 12 mg/dL (ref 8–27)
CO2: 26 mmol/L (ref 18–29)
Calcium: 10.8 mg/dL — ABNORMAL HIGH (ref 8.7–10.3)
Chloride: 101 mmol/L (ref 97–108)
Creatinine, Ser: 1.04 mg/dL — ABNORMAL HIGH (ref 0.57–1.00)
GFR, EST AFRICAN AMERICAN: 57 mL/min/{1.73_m2} — AB (ref >59)
GFR, EST NON AFRICAN AMERICAN: 50 mL/min/{1.73_m2} — AB (ref >59)
GLUCOSE: 105 mg/dL — AB (ref 65–99)
Potassium: 4.7 mmol/L (ref 3.5–5.2)
Sodium: 141 mmol/L (ref 134–144)

## 2014-01-27 LAB — VITAMIN D 25 HYDROXY (VIT D DEFICIENCY, FRACTURES): Vit D, 25-Hydroxy: 57.7 ng/mL (ref 30.0–100.0)

## 2014-01-28 ENCOUNTER — Other Ambulatory Visit: Payer: Medicare Other

## 2014-01-28 DIAGNOSIS — Z1212 Encounter for screening for malignant neoplasm of rectum: Secondary | ICD-10-CM

## 2014-01-28 NOTE — Progress Notes (Signed)
Patient came in for labs only.

## 2014-01-30 LAB — FECAL OCCULT BLOOD, IMMUNOCHEMICAL: Fecal Occult Bld: NEGATIVE

## 2014-02-05 ENCOUNTER — Encounter: Payer: Self-pay | Admitting: *Deleted

## 2014-02-05 NOTE — Progress Notes (Signed)
Quick Note:  Copy of labs sent to patient ______ 

## 2014-02-09 ENCOUNTER — Other Ambulatory Visit (INDEPENDENT_AMBULATORY_CARE_PROVIDER_SITE_OTHER): Payer: Medicare Other

## 2014-02-09 DIAGNOSIS — R7989 Other specified abnormal findings of blood chemistry: Secondary | ICD-10-CM | POA: Diagnosis not present

## 2014-02-09 NOTE — Progress Notes (Signed)
Pt came in for labs only 

## 2014-02-10 LAB — BMP8+EGFR
BUN/Creatinine Ratio: 14 (ref 11–26)
BUN: 15 mg/dL (ref 8–27)
CO2: 23 mmol/L (ref 18–29)
Calcium: 10.5 mg/dL — ABNORMAL HIGH (ref 8.7–10.3)
Chloride: 101 mmol/L (ref 97–108)
Creatinine, Ser: 1.04 mg/dL — ABNORMAL HIGH (ref 0.57–1.00)
GFR calc Af Amer: 57 mL/min/{1.73_m2} — ABNORMAL LOW (ref >59)
GFR, EST NON AFRICAN AMERICAN: 50 mL/min/{1.73_m2} — AB (ref >59)
Glucose: 106 mg/dL — ABNORMAL HIGH (ref 65–99)
POTASSIUM: 4.9 mmol/L (ref 3.5–5.2)
Sodium: 142 mmol/L (ref 134–144)

## 2014-03-12 ENCOUNTER — Ambulatory Visit (INDEPENDENT_AMBULATORY_CARE_PROVIDER_SITE_OTHER): Payer: Medicare Other | Admitting: *Deleted

## 2014-03-12 DIAGNOSIS — I459 Conduction disorder, unspecified: Secondary | ICD-10-CM

## 2014-03-12 LAB — MDC_IDC_ENUM_SESS_TYPE_REMOTE
Battery Impedance: 100 Ohm
Battery Voltage: 2.79 V
Brady Statistic AP VP Percent: 12 %
Brady Statistic AP VS Percent: 0 %
Brady Statistic AS VP Percent: 85 %
Brady Statistic AS VS Percent: 3 %
Date Time Interrogation Session: 20150514132540
Lead Channel Impedance Value: 392 Ohm
Lead Channel Pacing Threshold Amplitude: 0.5 V
Lead Channel Pacing Threshold Pulse Width: 0.4 ms
Lead Channel Sensing Intrinsic Amplitude: 2.8 mV
Lead Channel Setting Pacing Amplitude: 2.5 V
Lead Channel Setting Pacing Pulse Width: 0.4 ms
Lead Channel Setting Sensing Sensitivity: 2 mV
MDC IDC MSMT BATTERY REMAINING LONGEVITY: 136 mo
MDC IDC MSMT LEADCHNL RV IMPEDANCE VALUE: 539 Ohm
MDC IDC MSMT LEADCHNL RV PACING THRESHOLD AMPLITUDE: 0.625 V
MDC IDC MSMT LEADCHNL RV PACING THRESHOLD PULSEWIDTH: 0.4 ms
MDC IDC SET LEADCHNL RA PACING AMPLITUDE: 2 V

## 2014-03-12 NOTE — Progress Notes (Signed)
Remote pacemaker transmission.   

## 2014-03-18 ENCOUNTER — Telehealth: Payer: Self-pay | Admitting: Family Medicine

## 2014-03-18 ENCOUNTER — Other Ambulatory Visit: Payer: Self-pay

## 2014-03-18 DIAGNOSIS — Z1231 Encounter for screening mammogram for malignant neoplasm of breast: Secondary | ICD-10-CM

## 2014-03-18 NOTE — Telephone Encounter (Signed)
Please call patient to let her know that she can have a DEXA scan done but it may not be done at the same time. Please place an order for the DEXA scan at her location of convenience

## 2014-03-19 NOTE — Telephone Encounter (Signed)
Patient aware.

## 2014-03-25 ENCOUNTER — Encounter: Payer: Self-pay | Admitting: Pharmacist

## 2014-03-25 ENCOUNTER — Ambulatory Visit (INDEPENDENT_AMBULATORY_CARE_PROVIDER_SITE_OTHER): Payer: Medicare Other

## 2014-03-25 ENCOUNTER — Ambulatory Visit (INDEPENDENT_AMBULATORY_CARE_PROVIDER_SITE_OTHER): Payer: Medicare Other | Admitting: Pharmacist

## 2014-03-25 VITALS — Ht 65.0 in | Wt 144.0 lb

## 2014-03-25 DIAGNOSIS — M899 Disorder of bone, unspecified: Secondary | ICD-10-CM

## 2014-03-25 DIAGNOSIS — M949 Disorder of cartilage, unspecified: Secondary | ICD-10-CM

## 2014-03-25 DIAGNOSIS — Z780 Asymptomatic menopausal state: Secondary | ICD-10-CM | POA: Diagnosis not present

## 2014-03-25 DIAGNOSIS — W19XXXA Unspecified fall, initial encounter: Secondary | ICD-10-CM

## 2014-03-25 DIAGNOSIS — Y92009 Unspecified place in unspecified non-institutional (private) residence as the place of occurrence of the external cause: Secondary | ICD-10-CM

## 2014-03-25 DIAGNOSIS — M8580 Other specified disorders of bone density and structure, unspecified site: Secondary | ICD-10-CM

## 2014-03-25 DIAGNOSIS — Z0000 Encounter for general adult medical examination without abnormal findings: Secondary | ICD-10-CM

## 2014-03-25 LAB — HM DEXA SCAN

## 2014-03-25 NOTE — Progress Notes (Signed)
Patient ID: [name], female   DOB: [birth year], 80 y.o.   MRN: [id number]  [facility] Current Height: Height: 5\' 5"  (165.1 cm)      Max Lifetime Height:  5\' 6"  Current Weight: Weight: 144 lb (65.318 kg)       Ethnicity:Caucasian     HPI: Does pt already have a diagnosis of:  Osteopenia?  No Osteoporosis?  No  Back Pain?  No       Kyphosis?  No Prior fracture?  No Med(s) for Osteoporosis/Osteopenia:  none Med(s) previously tried for Osteoporosis/Osteopenia:  none                                                             PMH: Age at menopause:  Surgical around 80 yo Hysterectomy?  Yes Oophorectomy?  No HRT? No Steroid Use?  No Thyroid med?  Yes History of cancer?  No History of digestive disorders (ie Crohn's)?  No Current or previous eating disorders?  No Last Vitamin D Result:  57.7 (01/26/2014) Last GFR Result:  50 (01/26/2014)   FH/SH: Family history of osteoporosis?  No Parent with history of hip fracture?  No Family history of breast cancer?  Yes - sister Exercise?  No Smoking?  No Alcohol?  No    Calcium Assessment Calcium Intake  # of servings/day  Calcium mg  Milk (8 oz) 1  x  300  = 300mg   Yogurt (4 oz) 0 x  200 = 0  Cheese (1 oz) 1 x  200 = 200mg   Other Calcium sources   250mg   Ca supplement MVI = 400mg    Estimated calcium intake per day 1150mg     DEXA Results Date of Test T-Score for AP Spine L1-L4 T-Score for neck of Left Hip T-Score for neck of Right Hip  03/25/2014 -0.6 -1.8 -1.8                  FRAX 10 year estimate: Total FX risk:  15%  (consider medication if >/= 20%) Hip FX risk:  4.4%  (consider medication if >/= 3%)  Assessment: Osteopenia with significant fracture risk over next 10 years  Recommendations: 1.  Patient refused pharmacological intervention at this time.  She is aware of fracture risk and benefits of treatment. Information about evista given to patient to review again.  Complications of hip or spinal  fracture discussed with patient 2.  recommend calcium 1200mg  daily through supplementation or diet.  3.  recommend weight bearing exercise - 30 minutes at least 4 days per week.   4.  Counseled and educated about fall risk and prevention.  Recheck DEXA:  2 years  Time spent counseling patient:  30 minutes  Cherre Robins, PharmD, CPP

## 2014-03-25 NOTE — Patient Instructions (Signed)
Raloxifene tablets What is this medicine? RALOXIFENE (ral OX i feen) reduces the amount of calcium lost from bones. It is used to treat and prevent osteoporosis in women who have experienced menopause. This medicine may be used for other purposes; ask your health care provider or pharmacist if you have questions. COMMON BRAND NAME(S): Evista What should I tell my health care provider before I take this medicine? They need to know if you have any of these conditions: -a history of blood clots -cancer -heart failure -liver disease -premenopausal -an unusual or allergic reaction to raloxifene, other medicines, foods, dyes, or preservatives -pregnant or trying to get pregnant -breast-feeding How should I use this medicine? Take this medicine by mouth with a glass of water. Follow the directions on the prescription label. The tablets can be taken with or without food. Take your doses at regular intervals. Do not take your medicine more often than directed. Talk to your pediatrician regarding the use of this medicine in children. Special care may be needed. Overdosage: If you think you have taken too much of this medicine contact a poison control center or emergency room at once. NOTE: This medicine is only for you. Do not share this medicine with others. What if I miss a dose? If you miss a dose, take it as soon as you can. If it is almost time for your next dose, take only that dose. Do not take double or extra doses. What may interact with this medicine? -ampicillin -cholestyramine -colestipol -diazepam -diazoxide -female hormones like hormone replacement therapy -lidocaine -warfarin This list may not describe all possible interactions. Give your health care provider a list of all the medicines, herbs, non-prescription drugs, or dietary supplements you use. Also tell them if you smoke, drink alcohol, or use illegal drugs. Some items may interact with your medicine. What should I watch  for while using this medicine? Visit your doctor or health care professional for regular checks on your progress. Do not stop taking this medicine except on the advice of your doctor or health care professional. You should make sure you get enough calcium and vitamin D in your diet while you are taking this medicine. Discuss your dietary needs with your health care professional or nutritionist. Exercise may help to prevent bone loss. Discuss your exercise needs with your doctor or health care professional. This medicine can rarely cause blood clots. You should avoid long periods of bed rest while taking this medicine. If you are going to have surgery, tell your doctor or health care professional that you are taking this medicine. This medicine should be stopped at least 3 days before surgery. After surgery, it should be restarted only after you are walking again. It should not be restarted while you still need long periods of bed rest. You should not smoke while taking this medicine. Smoking may also increase your risk of blood clots. Smoking can also decrease the effects of this medicine. This medicine does not prevent hot flashes. It may cause hot flashes in some patients at the start of therapy. What side effects may I notice from receiving this medicine? Side effects that you should report to your doctor or health care professional as soon as possible: -change in vision -chest pain -difficulty breathing -leg pain or swelling -skin rash, itching Side effects that usually do not require medical attention (report to your doctor or health care professional if they continue or are bothersome): -fluid build-up -leg cramps -stomach pain -sweating This list may not  describe all possible side effects. Call your doctor for medical advice about side effects. You may report side effects to FDA at 1-800-FDA-1088. Where should I keep my medicine? Keep out of the reach of children. Store at room  temperature between 15 and 30 degrees C (59 and 86 degrees F). Throw away any unused medicine after the expiration date. NOTE: This sheet is a summary. It may not cover all possible information. If you have questions about this medicine, talk to your doctor, pharmacist, or health care provider.  2014, Elsevier/Gold Standard. (2008-10-01 15:15:14)    Fall Prevention and Home Safety Falls cause injuries and can affect all age groups. It is possible to use preventive measures to significantly decrease the likelihood of falls. There are many simple measures which can make your home safer and prevent falls. OUTDOORS  Repair cracks and edges of walkways and driveways.  Remove high doorway thresholds.  Trim shrubbery on the main path into your home.  Have good outside lighting.  Clear walkways of tools, rocks, debris, and clutter.  Check that handrails are not broken and are securely fastened. Both sides of steps should have handrails.  Have leaves, snow, and ice cleared regularly.  Use sand or salt on walkways during winter months.  In the garage, clean up grease or oil spills. BATHROOM  Install night lights.  Install grab bars by the toilet and in the tub and shower.  Use non-skid mats or decals in the tub or shower.  Place a plastic non-slip stool in the shower to sit on, if needed.  Keep floors dry and clean up all water on the floor immediately.  Remove soap buildup in the tub or shower on a regular basis.  Secure bath mats with non-slip, double-sided rug tape.  Remove throw rugs and tripping hazards from the floors. BEDROOMS  Install night lights.  Make sure a bedside light is easy to reach.  Do not use oversized bedding.  Keep a telephone by your bedside.  Have a firm chair with side arms to use for getting dressed.  Remove throw rugs and tripping hazards from the floor. KITCHEN  Keep handles on pots and pans turned toward the center of the stove. Use back  burners when possible.  Clean up spills quickly and allow time for drying.  Avoid walking on wet floors.  Avoid hot utensils and knives.  Position shelves so they are not too high or low.  Place commonly used objects within easy reach.  If necessary, use a sturdy step stool with a grab bar when reaching.  Keep electrical cables out of the way.  Do not use floor polish or wax that makes floors slippery. If you must use wax, use non-skid floor wax.  Remove throw rugs and tripping hazards from the floor. STAIRWAYS  Never leave objects on stairs.  Place handrails on both sides of stairways and use them. Fix any loose handrails. Make sure handrails on both sides of the stairways are as long as the stairs.  Check carpeting to make sure it is firmly attached along stairs. Make repairs to worn or loose carpet promptly.  Avoid placing throw rugs at the top or bottom of stairways, or properly secure the rug with carpet tape to prevent slippage. Get rid of throw rugs, if possible.  Have an electrician put in a light switch at the top and bottom of the stairs. OTHER FALL PREVENTION TIPS  Wear low-heel or rubber-soled shoes that are supportive and fit well.  Wear closed toe shoes.  When using a stepladder, make sure it is fully opened and both spreaders are firmly locked. Do not climb a closed stepladder.  Add color or contrast paint or tape to grab bars and handrails in your home. Place contrasting color strips on first and last steps.  Learn and use mobility aids as needed. Install an electrical emergency response system.  Turn on lights to avoid dark areas. Replace light bulbs that burn out immediately. Get light switches that glow.  Arrange furniture to create clear pathways. Keep furniture in the same place.  Firmly attach carpet with non-skid or double-sided tape.  Eliminate uneven floor surfaces.  Select a carpet pattern that does not visually hide the edge of steps.  Be  aware of all pets. OTHER HOME SAFETY TIPS  Set the water temperature for 120 F (48.8 C).  Keep emergency numbers on or near the telephone.  Keep smoke detectors on every level of the home and near sleeping areas. Document Released: 10/06/2002 Document Revised: 04/16/2012 Document Reviewed: 01/05/2012 [facility] Patient Information 2014 [employer].

## 2014-04-10 ENCOUNTER — Encounter: Payer: Self-pay | Admitting: Cardiology

## 2014-04-15 ENCOUNTER — Encounter: Payer: Self-pay | Admitting: Internal Medicine

## 2014-04-17 ENCOUNTER — Ambulatory Visit
Admission: RE | Admit: 2014-04-17 | Discharge: 2014-04-17 | Disposition: A | Discharge status: Home / Self Care | Payer: Medicare Other | Source: Ambulatory Visit

## 2014-04-17 DIAGNOSIS — Z1231 Encounter for screening mammogram for malignant neoplasm of breast: Secondary | ICD-10-CM | POA: Diagnosis not present

## 2014-06-15 ENCOUNTER — Ambulatory Visit (INDEPENDENT_AMBULATORY_CARE_PROVIDER_SITE_OTHER): Payer: Medicare Other | Admitting: *Deleted

## 2014-06-15 DIAGNOSIS — I442 Atrioventricular block, complete: Secondary | ICD-10-CM

## 2014-06-15 NOTE — Progress Notes (Signed)
Remote pacemaker transmission.   

## 2014-06-17 LAB — MDC_IDC_ENUM_SESS_TYPE_REMOTE
Battery Impedance: 100 Ohm
Battery Remaining Longevity: 137 mo
Brady Statistic AP VP Percent: 12 %
Brady Statistic AS VS Percent: 11 %
Lead Channel Impedance Value: 408 Ohm
Lead Channel Impedance Value: 526 Ohm
Lead Channel Pacing Threshold Pulse Width: 0.4 ms
Lead Channel Setting Pacing Amplitude: 2 V
Lead Channel Setting Pacing Amplitude: 2.5 V
Lead Channel Setting Pacing Pulse Width: 0.4 ms
Lead Channel Setting Sensing Sensitivity: 2 mV
MDC IDC MSMT BATTERY VOLTAGE: 2.8 V
MDC IDC MSMT LEADCHNL RA PACING THRESHOLD AMPLITUDE: 0.5 V
MDC IDC MSMT LEADCHNL RA PACING THRESHOLD PULSEWIDTH: 0.4 ms
MDC IDC MSMT LEADCHNL RA SENSING INTR AMPL: 2.8 mV
MDC IDC MSMT LEADCHNL RV PACING THRESHOLD AMPLITUDE: 0.625 V
MDC IDC SESS DTM: 20150817122836
MDC IDC STAT BRADY AP VS PERCENT: 0 %
MDC IDC STAT BRADY AS VP PERCENT: 77 %

## 2014-06-29 ENCOUNTER — Encounter: Payer: Self-pay | Admitting: Internal Medicine

## 2014-07-15 DIAGNOSIS — Z961 Presence of intraocular lens: Secondary | ICD-10-CM | POA: Diagnosis not present

## 2014-07-15 DIAGNOSIS — H26499 Other secondary cataract, unspecified eye: Secondary | ICD-10-CM | POA: Diagnosis not present

## 2014-07-15 DIAGNOSIS — H43819 Vitreous degeneration, unspecified eye: Secondary | ICD-10-CM | POA: Diagnosis not present

## 2014-07-15 DIAGNOSIS — H04129 Dry eye syndrome of unspecified lacrimal gland: Secondary | ICD-10-CM | POA: Diagnosis not present

## 2014-07-15 DIAGNOSIS — D3130 Benign neoplasm of unspecified choroid: Secondary | ICD-10-CM | POA: Diagnosis not present

## 2014-07-30 ENCOUNTER — Ambulatory Visit (INDEPENDENT_AMBULATORY_CARE_PROVIDER_SITE_OTHER): Payer: Medicare Other | Admitting: Family Medicine

## 2014-07-30 ENCOUNTER — Encounter: Payer: Self-pay | Admitting: Family Medicine

## 2014-07-30 VITALS — BP 147/69 | HR 89 | Temp 98.2°F | Ht 65.0 in | Wt 144.0 lb

## 2014-07-30 DIAGNOSIS — D128 Benign neoplasm of rectum: Secondary | ICD-10-CM | POA: Diagnosis not present

## 2014-07-30 DIAGNOSIS — M8580 Other specified disorders of bone density and structure, unspecified site: Secondary | ICD-10-CM

## 2014-07-30 DIAGNOSIS — I459 Conduction disorder, unspecified: Secondary | ICD-10-CM | POA: Diagnosis not present

## 2014-07-30 DIAGNOSIS — R011 Cardiac murmur, unspecified: Secondary | ICD-10-CM

## 2014-07-30 DIAGNOSIS — M899 Disorder of bone, unspecified: Secondary | ICD-10-CM

## 2014-07-30 DIAGNOSIS — Z950 Presence of cardiac pacemaker: Secondary | ICD-10-CM | POA: Diagnosis not present

## 2014-07-30 DIAGNOSIS — E039 Hypothyroidism, unspecified: Secondary | ICD-10-CM | POA: Diagnosis not present

## 2014-07-30 LAB — POCT CBC
Granulocyte percent: 66.2 %G (ref 37–80)
HEMATOCRIT: 40.7 % (ref 37.7–47.9)
Hemoglobin: 13.3 g/dL (ref 12.2–16.2)
Lymph, poc: 1.5 (ref 0.6–3.4)
MCH, POC: 31.1 pg (ref 27–31.2)
MCHC: 32.7 g/dL (ref 31.8–35.4)
MCV: 95.3 fL (ref 80–97)
MPV: 7.6 fL (ref 0–99.8)
POC GRANULOCYTE: 4.1 (ref 2–6.9)
POC LYMPH %: 23.7 % (ref 10–50)
Platelet Count, POC: 323 10*3/uL (ref 142–424)
RBC: 4.3 M/uL (ref 4.04–5.48)
RDW, POC: 14.6 %
WBC: 6.2 10*3/uL (ref 4.6–10.2)

## 2014-07-30 NOTE — Progress Notes (Signed)
Subjective:    Patient ID: [name], female    DOB: [birth year], 80 y.o.   MRN: [id number]  HPI Pt here for follow up and management of chronic medical problems. The patient has no complaints today. She is here to get lab work. She did refuses to take the flu shot and a Prevnar vaccine. She does not need any refills. The patient has a history of complete heart block, hypothyroidism, and adenomatous polyps. She also has a history of a pacemaker. She also has osteopenia. The patient comes to the visit today with her). She was pleasant and cooperative. She has had a recent eye exam.         Patient Active Problem List   Diagnosis Date Noted  . Osteopenia of the elderly 03/25/2014  . Systolic ejection murmur 28/63/8177  . History of pacemaker 09/04/2013  . Heart block 08/31/2013  . Near syncope 08/29/2013  . Fall at home, with facial injury 08/29/2013  . UTI (lower urinary tract infection) 08/29/2013  . Sigmoid diverticulitis 04/23/2013  . Hypothyroidism 04/23/2013  . Chronic back pain 04/23/2013  . Villous adenoma of rectum 01/16/2013  . Mass of left lobe of liver 01/16/2013   Outpatient Encounter Prescriptions as of 07/30/2014  Medication Sig  . acetaminophen (TYLENOL) 500 MG tablet Take 500 mg by mouth every 6 (six) hours as needed.  Marland Kitchen aspirin 81 MG tablet Take 81 mg by mouth as needed for pain.  . Multiple Vitamin (MULTIVITAMIN WITH MINERALS) TABS Take 1 tablet by mouth daily.  Marland Kitchen SYNTHROID 112 MCG tablet Take 1 tablet (112 mcg total) by mouth daily before breakfast.    Review of Systems  Constitutional: Negative.   HENT: Negative.   Eyes: Negative.   Respiratory: Negative.   Cardiovascular: Negative.   Gastrointestinal: Negative.   Endocrine: Negative.   Genitourinary: Negative.   Musculoskeletal: Negative.   Skin: Negative.   Allergic/Immunologic: Negative.   Neurological: Negative.   Hematological: Negative.   Psychiatric/Behavioral: Negative.          Objective:   Physical Exam  Nursing note and vitals reviewed. Constitutional: She is oriented to person, place, and time. She appears well-developed and well-nourished. No distress.  HENT:  Head: Normocephalic and atraumatic.  Right Ear: External ear normal.  Left Ear: External ear normal.  Nose: Nose normal.  Mouth/Throat: Oropharynx is clear and moist. No oropharyngeal exudate.  Eyes: Conjunctivae and EOM are normal. Pupils are equal, round, and reactive to light. Right eye exhibits no discharge. Left eye exhibits no discharge. No scleral icterus.  Neck: Normal range of motion. Neck supple. No thyromegaly present.  Referred heart murmur to the carotids  Cardiovascular: Normal rate, regular rhythm and intact distal pulses.  Exam reveals no gallop and no friction rub.   Murmur heard. The patient has a grade 3/6 systolic ejection murmur  Pulmonary/Chest: Effort normal and breath sounds normal. No respiratory distress. She has no wheezes. She has no rales. She exhibits no tenderness.  Abdominal: Soft. Bowel sounds are normal. She exhibits no mass. There is no tenderness. There is no rebound and no guarding.  Musculoskeletal: Normal range of motion. She exhibits no edema and no tenderness.  Lymphadenopathy:    She has no cervical adenopathy.  Neurological: She is alert and oriented to person, place, and time. She has normal reflexes. No cranial nerve deficit.  Skin: Skin is warm and dry. No rash noted.  Pacemaker site looks normal  Psychiatric: She has a normal mood and  affect. Her behavior is normal. Judgment and thought content normal.   BP 147/69  Pulse 89  Temp(Src) 98.2 F (36.8 C) (Oral)  Ht _0  (1.651 m)  Wt 144 lb (65.318 kg)  BMI 23.96 kg/m2        Assessment & Plan:  1. Hypothyroidism, unspecified hypothyroidism type - POCT CBC - Thyroid Panel With TSH  2. Heart block - POCT CBC - BMP8+EGFR - Hepatic function panel - Lipid panel - Thyroid Panel With  TSH  3. Cardiac pacemaker in situ  4. Osteopenia of the elderly  5. Systolic ejection murmur  6. Adenomatous polyp of rectum   Patient Instructions                       Medicare Annual Wellness Visit  [facility] and the medical providers at [facility] strive to bring you the best medical care.  In doing so we not only want to address your current medical conditions and concerns but also to detect new conditions early and prevent illness, disease and health-related problems.    Medicare offers a yearly Wellness Visit which allows our clinical staff to assess your need for preventative services including immunizations, lifestyle education, counseling to decrease risk of preventable diseases and screening for fall risk and other medical concerns.    This visit is provided free of charge (no copay) for all Medicare recipients. The clinical pharmacists at [facility] have begun to conduct these Wellness Visits which will also include a thorough review of all your medications.    As you primary medical provider recommend that you make an appointment for your Annual Wellness Visit if you have not done so already this year.  You may set up this appointment before you leave today or you may call back ([phone]) and schedule an appointment.  Please make sure when you call that you mention that you are scheduling your Annual Wellness Visit with the clinical pharmacist so that the appointment may be made for the proper length of time.     Continue current medications. Continue good therapeutic lifestyle changes which include good diet and exercise. Fall precautions discussed with patient. If an FOBT was given today- please return it to our front desk. If you are over 44 years old - you may need Prevnar 74 or the adult Pneumonia vaccine.  Flu Shots will be available at our office starting mid- September. Please call and schedule a FLU CLINIC  APPOINTMENT.   Please keep appointments with cardiology and gastroenterology We will give you a signed handicap form to get your handicap parking permit We will also give you a note indicating that you may not be use your shoulder belt as often as necessary because it irritates your pacemaker   Arrie Senate MD

## 2014-07-30 NOTE — Patient Instructions (Addendum)
Medicare Annual Wellness Visit  [facility] and the medical providers at [facility] strive to bring you the best medical care.  In doing so we not only want to address your current medical conditions and concerns but also to detect new conditions early and prevent illness, disease and health-related problems.    Medicare offers a yearly Wellness Visit which allows our clinical staff to assess your need for preventative services including immunizations, lifestyle education, counseling to decrease risk of preventable diseases and screening for fall risk and other medical concerns.    This visit is provided free of charge (no copay) for all Medicare recipients. The clinical pharmacists at [facility] have begun to conduct these Wellness Visits which will also include a thorough review of all your medications.    As you primary medical provider recommend that you make an appointment for your Annual Wellness Visit if you have not done so already this year.  You may set up this appointment before you leave today or you may call back ([phone]) and schedule an appointment.  Please make sure when you call that you mention that you are scheduling your Annual Wellness Visit with the clinical pharmacist so that the appointment may be made for the proper length of time.     Continue current medications. Continue good therapeutic lifestyle changes which include good diet and exercise. Fall precautions discussed with patient. If an FOBT was given today- please return it to our front desk. If you are over 67 years old - you may need Prevnar 58 or the adult Pneumonia vaccine.  Flu Shots will be available at our office starting mid- September. Please call and schedule a FLU CLINIC APPOINTMENT.   Please keep appointments with cardiology and gastroenterology We will give you a signed handicap form to get your handicap parking permit We will also  give you a note indicating that you may not be use your shoulder belt as often as necessary because it irritates your pacemaker

## 2014-07-31 ENCOUNTER — Other Ambulatory Visit: Payer: Self-pay | Admitting: *Deleted

## 2014-07-31 LAB — BMP8+EGFR
BUN / CREAT RATIO: 13 (ref 11–26)
BUN: 14 mg/dL (ref 8–27)
CHLORIDE: 104 mmol/L (ref 97–108)
CO2: 27 mmol/L (ref 18–29)
Calcium: 10.7 mg/dL — ABNORMAL HIGH (ref 8.7–10.3)
Creatinine, Ser: 1.09 mg/dL — ABNORMAL HIGH (ref 0.57–1.00)
GFR calc Af Amer: 54 mL/min/{1.73_m2} — ABNORMAL LOW (ref >59)
GFR calc non Af Amer: 47 mL/min/{1.73_m2} — ABNORMAL LOW (ref >59)
GLUCOSE: 96 mg/dL (ref 65–99)
Potassium: 5.2 mmol/L (ref 3.5–5.2)
Sodium: 145 mmol/L — ABNORMAL HIGH (ref 134–144)

## 2014-07-31 LAB — HEPATIC FUNCTION PANEL
ALK PHOS: 73 IU/L (ref 39–117)
ALT: 14 IU/L (ref 0–32)
AST: 21 IU/L (ref 0–40)
Albumin: 4.3 g/dL (ref 3.5–4.7)
Bilirubin, Direct: 0.19 mg/dL (ref 0.00–0.40)
TOTAL PROTEIN: 6.9 g/dL (ref 6.0–8.5)
Total Bilirubin: 0.7 mg/dL (ref 0.0–1.2)

## 2014-07-31 LAB — LIPID PANEL
Chol/HDL Ratio: 2.9 ratio units (ref 0.0–4.4)
Cholesterol, Total: 214 mg/dL — ABNORMAL HIGH (ref 100–199)
HDL: 74 mg/dL (ref >39)
LDL Calculated: 120 mg/dL — ABNORMAL HIGH (ref 0–99)
TRIGLYCERIDES: 100 mg/dL (ref 0–149)
VLDL Cholesterol Cal: 20 mg/dL (ref 5–40)

## 2014-07-31 LAB — THYROID PANEL WITH TSH
FREE THYROXINE INDEX: 3.4 (ref 1.2–4.9)
T3 UPTAKE RATIO: 31 % (ref 24–39)
T4, Total: 11.1 ug/dL (ref 4.5–12.0)
TSH: 1.49 u[IU]/mL (ref 0.450–4.500)

## 2014-07-31 NOTE — Addendum Note (Signed)
Addended by: Selmer Dominion on: 07/31/2014 10:50 AM   Modules accepted: Orders

## 2014-08-03 LAB — PTH, INTACT AND CALCIUM: PTH: 27 pg/mL (ref 15–65)

## 2014-08-05 ENCOUNTER — Other Ambulatory Visit: Payer: Self-pay | Admitting: Family Medicine

## 2014-09-23 ENCOUNTER — Encounter: Payer: Self-pay | Admitting: *Deleted

## 2014-10-05 ENCOUNTER — Telehealth: Payer: Self-pay | Admitting: Internal Medicine

## 2014-10-05 DIAGNOSIS — Z85048 Personal history of other malignant neoplasm of rectum, rectosigmoid junction, and anus: Secondary | ICD-10-CM

## 2014-10-05 NOTE — Telephone Encounter (Signed)
Patient has not wanted to pursue any surgical procedure and we have had this discussion multiple times Repeating the CT would be most helpful if she were going to act on the results, i.e. consider surgery I'm okay repeating it but we may not do anything different with the results

## 2014-10-05 NOTE — Telephone Encounter (Signed)
Pt had ct in dec 2014 and it is recommended that she have repeat in 2 years. Pt called and would like to have it done now because she states she has some decisions to make regarding some of her property and having the CT will help her to make these decisions. Are you ok ordering the CT now?

## 2014-10-06 ENCOUNTER — Other Ambulatory Visit: Payer: Self-pay

## 2014-10-06 ENCOUNTER — Other Ambulatory Visit (INDEPENDENT_AMBULATORY_CARE_PROVIDER_SITE_OTHER): Payer: Medicare Other

## 2014-10-06 DIAGNOSIS — Z85048 Personal history of other malignant neoplasm of rectum, rectosigmoid junction, and anus: Secondary | ICD-10-CM | POA: Diagnosis not present

## 2014-10-06 DIAGNOSIS — R911 Solitary pulmonary nodule: Secondary | ICD-10-CM

## 2014-10-06 DIAGNOSIS — K621 Rectal polyp: Secondary | ICD-10-CM

## 2014-10-06 LAB — BASIC METABOLIC PANEL
BUN: 16 mg/dL (ref 6–23)
CALCIUM: 10.6 mg/dL — AB (ref 8.4–10.5)
CO2: 26 meq/L (ref 19–32)
CREATININE: 1.1 mg/dL (ref 0.4–1.2)
Chloride: 105 mEq/L (ref 96–112)
GFR: 52.49 mL/min — ABNORMAL LOW (ref >60.00)
Glucose, Bld: 101 mg/dL — ABNORMAL HIGH (ref 70–99)
Potassium: 4.5 mEq/L (ref 3.5–5.1)
SODIUM: 141 meq/L (ref 135–145)

## 2014-10-06 NOTE — Telephone Encounter (Signed)
Pt scheduled for CT of CAP at [facility] CT 10/08/14@9 :30am. Pt to be NPO after 5:30am, drink bottle 1 of contrast at 7:30am, bottle 2 at 8:30am. Pt to come today or tomorrow for labs and to pick up contrast. Pt aware.

## 2014-10-08 ENCOUNTER — Ambulatory Visit (INDEPENDENT_AMBULATORY_CARE_PROVIDER_SITE_OTHER)
Admission: RE | Admit: 2014-10-08 | Discharge: 2014-10-08 | Disposition: A | Discharge status: Home / Self Care | Payer: Medicare Other | Source: Ambulatory Visit | Attending: Internal Medicine | Admitting: Internal Medicine

## 2014-10-08 DIAGNOSIS — Z85048 Personal history of other malignant neoplasm of rectum, rectosigmoid junction, and anus: Secondary | ICD-10-CM

## 2014-10-08 DIAGNOSIS — C20 Malignant neoplasm of rectum: Secondary | ICD-10-CM | POA: Diagnosis not present

## 2014-10-08 DIAGNOSIS — K621 Rectal polyp: Secondary | ICD-10-CM | POA: Diagnosis not present

## 2014-10-08 DIAGNOSIS — R911 Solitary pulmonary nodule: Secondary | ICD-10-CM

## 2014-10-08 DIAGNOSIS — K5730 Diverticulosis of large intestine without perforation or abscess without bleeding: Secondary | ICD-10-CM | POA: Diagnosis not present

## 2014-10-08 MED ORDER — IOHEXOL 300 MG/ML  SOLN
100.0000 mL | Freq: Once | INTRAMUSCULAR | Status: AC | PRN
  Administered 2014-10-08: 100 mL via INTRAVENOUS

## 2014-11-11 ENCOUNTER — Encounter: Payer: Self-pay | Admitting: Internal Medicine

## 2014-11-11 ENCOUNTER — Ambulatory Visit (INDEPENDENT_AMBULATORY_CARE_PROVIDER_SITE_OTHER): Payer: Medicare Other | Admitting: Internal Medicine

## 2014-11-11 VITALS — BP 164/60 | HR 76 | Ht 65.0 in | Wt 144.6 lb

## 2014-11-11 DIAGNOSIS — I059 Rheumatic mitral valve disease, unspecified: Secondary | ICD-10-CM | POA: Diagnosis not present

## 2014-11-11 DIAGNOSIS — I459 Conduction disorder, unspecified: Secondary | ICD-10-CM | POA: Diagnosis not present

## 2014-11-11 DIAGNOSIS — R0602 Shortness of breath: Secondary | ICD-10-CM | POA: Insufficient documentation

## 2014-11-11 DIAGNOSIS — R011 Cardiac murmur, unspecified: Secondary | ICD-10-CM | POA: Diagnosis not present

## 2014-11-11 DIAGNOSIS — Z950 Presence of cardiac pacemaker: Secondary | ICD-10-CM

## 2014-11-11 DIAGNOSIS — R002 Palpitations: Secondary | ICD-10-CM | POA: Diagnosis not present

## 2014-11-11 NOTE — Patient Instructions (Addendum)
Your physician wants you to follow-up in: 1 year with Dr. Lovena Le. You will receive a reminder letter in the mail two months in advance. If you don't receive a letter, please call our office to schedule the follow-up appointment.  Your physician has requested that you have an echocardiogram. Echocardiography is a painless test that uses sound waves to create images of your heart. It provides your doctor with information about the size and shape of your heart and how well your heart's chambers and valves are working. This procedure takes approximately one hour. There are no restrictions for this procedure.  Your physician recommends that you continue on your current medications as directed. Please refer to the Current Medication list given to you today.  Remote monitoring is used to monitor your Pacemaker or ICD from home. This monitoring reduces the number of office visits required to check your device to one time per year. It allows [place] to keep an eye on the functioning of your device to ensure it is working properly. You are scheduled for a device check from home on 02/10/2015. You may send your transmission at any time that day. If you have a wireless device, the transmission will be sent automatically. After your physician reviews your transmission, you will receive a postcard with your next transmission date.

## 2014-11-11 NOTE — Assessment & Plan Note (Signed)
She has a h/o MR. Will plan to recheck her echo. Additional rec's based on the result of her echo.

## 2014-11-11 NOTE — Assessment & Plan Note (Signed)
Her palpitations are well controlled. Interogation of PPM shows brief episodes of SVT, likely atrial tachycardia. No change in medical therapy.

## 2014-11-11 NOTE — Assessment & Plan Note (Signed)
Her medtronic DDD PM is working normally. Will recheck in several months. 

## 2014-11-11 NOTE — Progress Notes (Signed)
      HPI [name] returns today for followup. She is a pleasant 81 yo woman with CHB, s/p PPM several months ago. In the interim, she has been stable. She denies chest pain or sob. No syncope. No edema. She has minimal soreness at her PPM insertion site. No Known Allergies   Current Outpatient Prescriptions  Medication Sig Dispense Refill  . acetaminophen (TYLENOL) 500 MG tablet Take 500 mg by mouth every 6 (six) hours as needed (pain).     Marland Kitchen aspirin 81 MG tablet Take 81 mg by mouth daily as needed for pain.     . Multiple Vitamin (MULTIVITAMIN WITH MINERALS) TABS Take 1 tablet by mouth daily.    Marland Kitchen SYNTHROID 112 MCG tablet TAKE 1 TABLET DAILY (Patient taking differently: TAKE 1 TABLET BY MOUTH  DAILY) 90 tablet 3   No current facility-administered medications for this visit.     Past Medical History  Diagnosis Date  . Back pain   . UTI (urinary tract infection)   . Hypothyroid   . Diverticulitis   . Liver mass   . Rectal polyp     ROS:   All systems reviewed and negative except as noted in the HPI.   Past Surgical History  Procedure Laterality Date  . Abdominal hysterectomy      still has ovaries  . Flexible sigmoidoscopy N/A 01/17/2013    Procedure: FLEXIBLE SIGMOIDOSCOPY;  Surgeon: [name], MD;  Location: [facility];  Service: Gastroenterology;  Laterality: N/A;  . Pacemaker placement  09/01/2013  . Inner ear surgery  1970's    due to infection  . Permanent pacemaker insertion N/A 09/01/2013    Procedure: PERMANENT PACEMAKER INSERTION;  Surgeon: [name], MD;  Location: [facility] CATH LAB;  Service: Cardiovascular;  Laterality: N/A;     Family History  Problem Relation Age of Onset  . Breast cancer Sister   . Cancer Sister     breast  . Diabetes Sister   . Heart disease Sister   . Heart disease Mother   . Colon cancer Neg Hx   . Heart disease Father      History   Social History  . Marital Status: Widowed    Spouse Name: N/A    Number of  Children: 1  . Years of Education: N/A   Occupational History  . retired    Social History Main Topics  . Smoking status: Never Smoker   . Smokeless tobacco: Never Used  . Alcohol Use: No  . Drug Use: No  . Sexual Activity: Not on file   Other Topics Concern  . Not on file   Social History Narrative     BP 164/60 mmHg  Pulse 76  Ht 5\' 5"  (1.651 m)  Wt 144 lb 9.6 oz (65.59 kg)  BMI 24.06 kg/m2  Physical Exam:  Well appearing elderly woman, NAD HEENT: Unremarkable Neck:  No JVD, no thyromegally Back:  No CVA tenderness Lungs:  Clear with no wheezes HEART:  Regular rate rhythm, 2/6 systolic murmurs, no rubs, no clicks Abd:  soft, positive bowel sounds, no organomegally, no rebound, no guarding Ext:  2 plus pulses, no edema, no cyanosis, no clubbing Skin:  No rashes no nodules Neuro:  CN II through XII intact, motor grossly intact   DEVICE  Normal device function.  See PaceArt for details.   Assess/Plan:

## 2014-11-11 NOTE — Assessment & Plan Note (Signed)
Will check a 2 D echo to evaluate. Etiology of symptoms is unclear.

## 2014-11-18 ENCOUNTER — Encounter: Payer: Self-pay | Admitting: *Deleted

## 2014-11-19 ENCOUNTER — Other Ambulatory Visit (HOSPITAL_COMMUNITY): Payer: Medicare Other

## 2014-11-25 ENCOUNTER — Ambulatory Visit: Payer: Medicare Other | Admitting: Internal Medicine

## 2014-11-26 ENCOUNTER — Ambulatory Visit (HOSPITAL_COMMUNITY): Payer: Medicare Other | Attending: Internal Medicine | Admitting: Radiology

## 2014-11-26 DIAGNOSIS — I059 Rheumatic mitral valve disease, unspecified: Secondary | ICD-10-CM

## 2014-11-26 NOTE — Progress Notes (Signed)
Echocardiogram performed.  

## 2014-12-03 ENCOUNTER — Telehealth: Payer: Self-pay | Admitting: Internal Medicine

## 2014-12-03 NOTE — Telephone Encounter (Signed)
New Message  Pt called for ECHO results

## 2014-12-03 NOTE — Telephone Encounter (Signed)
Spoke with patient and let her know her test

## 2014-12-03 NOTE — Telephone Encounter (Signed)
Follow Up         Pt returning phone call from [place], pt wants to be called back around 4pm.

## 2014-12-03 NOTE — Telephone Encounter (Signed)
She is scheduled for 12/08/14 with Dr Lovena Le

## 2014-12-08 ENCOUNTER — Ambulatory Visit (INDEPENDENT_AMBULATORY_CARE_PROVIDER_SITE_OTHER): Payer: Medicare Other | Admitting: Internal Medicine

## 2014-12-08 ENCOUNTER — Encounter: Payer: Self-pay | Admitting: Internal Medicine

## 2014-12-08 VITALS — BP 142/70 | HR 89 | Ht 65.0 in | Wt 145.0 lb

## 2014-12-08 DIAGNOSIS — I340 Nonrheumatic mitral (valve) insufficiency: Secondary | ICD-10-CM | POA: Diagnosis not present

## 2014-12-08 DIAGNOSIS — I459 Conduction disorder, unspecified: Secondary | ICD-10-CM

## 2014-12-08 DIAGNOSIS — R55 Syncope and collapse: Secondary | ICD-10-CM

## 2014-12-08 DIAGNOSIS — Z950 Presence of cardiac pacemaker: Secondary | ICD-10-CM | POA: Diagnosis not present

## 2014-12-08 DIAGNOSIS — R0602 Shortness of breath: Secondary | ICD-10-CM

## 2014-12-08 HISTORY — DX: Nonrheumatic mitral (valve) insufficiency: I34.0

## 2014-12-08 LAB — MDC_IDC_ENUM_SESS_TYPE_INCLINIC
Battery Impedance: 100 Ohm
Brady Statistic AP VS Percent: 1 %
Brady Statistic AS VS Percent: 76 %
Date Time Interrogation Session: 20160209123818
Lead Channel Impedance Value: 382 Ohm
Lead Channel Impedance Value: 506 Ohm
Lead Channel Pacing Threshold Amplitude: 0.75 V
Lead Channel Pacing Threshold Pulse Width: 0.4 ms
Lead Channel Pacing Threshold Pulse Width: 0.4 ms
Lead Channel Sensing Intrinsic Amplitude: 8 mV
Lead Channel Setting Pacing Amplitude: 2 V
Lead Channel Setting Sensing Sensitivity: 2 mV
MDC IDC MSMT BATTERY REMAINING LONGEVITY: 154 mo
MDC IDC MSMT BATTERY VOLTAGE: 2.79 V
MDC IDC MSMT LEADCHNL RA PACING THRESHOLD AMPLITUDE: 0.5 V
MDC IDC MSMT LEADCHNL RA SENSING INTR AMPL: 2.8 mV
MDC IDC SET LEADCHNL RV PACING AMPLITUDE: 2.5 V
MDC IDC SET LEADCHNL RV PACING PULSEWIDTH: 0.4 ms
MDC IDC STAT BRADY AP VP PERCENT: 12 %
MDC IDC STAT BRADY AS VP PERCENT: 12 %

## 2014-12-08 MED ORDER — FUROSEMIDE 20 MG PO TABS: 20.0000 mg | ORAL_TABLET | Freq: Every day | ORAL | Status: DC

## 2014-12-08 MED ORDER — LISINOPRIL 2.5 MG PO TABS: 2.5000 mg | ORAL_TABLET | Freq: Every day | ORAL | Status: DC

## 2014-12-08 NOTE — Assessment & Plan Note (Signed)
Her Medtronic dual-chamber pacemaker is working normally. We'll plan to recheck in several months. 

## 2014-12-08 NOTE — Assessment & Plan Note (Signed)
Her shortness of breath remains class II. I've encouraged the patient to reduce her salt intake, and increase her physical activity.

## 2014-12-08 NOTE — Patient Instructions (Addendum)
Your physician has recommended you make the following change in your medication:  1.) START LASIX 20 MG ONCE DAILY 2.) START LISINOPRIL 2.5 MG ONCE DAILY  Your physician recommends that you return for lab work in: [place] (12/16/14) BMET  You have been referred to DR Eunice.  Remote monitoring is used to monitor your Pacemaker of ICD from home. This monitoring reduces the number of office visits required to check your device to one time per year. It allows [place] to keep an eye on the functioning of your device to ensure it is working properly. You are scheduled for a device check from home on 03/09/15. You may send your transmission at any time that day. If you have a wireless device, the transmission will be sent automatically. After your physician reviews your transmission, you will receive a postcard with your next transmission date.  Your physician wants you to follow-up in: 12 months with Dr. Lovena Le.  You will receive a reminder letter in the mail two months in advance. If you don't receive a letter, please call our office to schedule the follow-up appointment.

## 2014-12-08 NOTE — Progress Notes (Signed)
HPI [name] returns today for followup. She is a pleasant 81 yo woman with CHB, s/p PPM placed just over a year ago. In the interim, she had been fairly stable, but when I saw the patient approximately one month ago, she noted increasing dyspnea, and it sounded like her mitral regurgitation had worsened. She has undergone 2-D echocardiography, which demonstrated severe mitral regurgitation. She has preserved left ventricular systolic function. She has not had syncope, and has class II symptoms. No Known Allergies   Current Outpatient Prescriptions  Medication Sig Dispense Refill  . acetaminophen (TYLENOL) 500 MG tablet Take 500 mg by mouth every 6 (six) hours as needed (pain).     Marland Kitchen aspirin 81 MG tablet Take 81 mg by mouth daily as needed for pain.     . Multiple Vitamin (MULTIVITAMIN WITH MINERALS) TABS Take 1 tablet by mouth daily.    Marland Kitchen SYNTHROID 112 MCG tablet TAKE 1 TABLET DAILY (Patient taking differently: TAKE 1 TABLET BY MOUTH  DAILY) 90 tablet 3   No current facility-administered medications for this visit.     Past Medical History  Diagnosis Date  . Back pain   . UTI (urinary tract infection)   . Hypothyroid   . Diverticulitis   . Liver lesion   . Rectal polyp   . Lung nodule     ROS:   All systems reviewed and negative except as noted in the HPI.   Past Surgical History  Procedure Laterality Date  . Abdominal hysterectomy      still has ovaries  . Flexible sigmoidoscopy N/A 01/17/2013    Procedure: FLEXIBLE SIGMOIDOSCOPY;  Surgeon: [name], MD;  Location: [facility];  Service: Gastroenterology;  Laterality: N/A;  . Pacemaker placement  09/01/2013  . Inner ear surgery  1970's    due to infection  . Permanent pacemaker insertion N/A 09/01/2013    Procedure: PERMANENT PACEMAKER INSERTION;  Surgeon: [name], MD;  Location: [facility] CATH LAB;  Service: Cardiovascular;  Laterality: N/A;     Family History  Problem Relation Age of Onset  .  Breast cancer Sister   . Cancer Sister     breast  . Diabetes Sister   . Heart disease Sister   . Heart disease Mother   . Colon cancer Neg Hx   . Heart disease Father      History   Social History  . Marital Status: Widowed    Spouse Name: N/A    Number of Children: 1  . Years of Education: N/A   Occupational History  . retired    Social History Main Topics  . Smoking status: Never Smoker   . Smokeless tobacco: Never Used  . Alcohol Use: No  . Drug Use: No  . Sexual Activity: Not on file   Other Topics Concern  . Not on file   Social History Narrative     BP 142/70 mmHg  Pulse 89  Ht 5\' 5"  (1.651 m)  Wt 145 lb (65.772 kg)  BMI 24.13 kg/m2  SpO2 97%  Physical Exam:  Well appearing elderly woman, NAD HEENT: Unremarkable Neck:  No JVD, no thyromegally Back:  No CVA tenderness Lungs:  Clear with no wheezes, well-healed pacemaker incision. HEART:  Regular rate rhythm, 2/6 systolic murmurs, no rubs, no clicks Abd:  soft, positive bowel sounds, no organomegally, no rebound, no guarding Ext:  2 plus pulses, no edema, no cyanosis, no clubbing Skin:  No rashes no nodules  Neuro:  CN II through XII intact, motor grossly intact   DEVICE  Normal device function.  See PaceArt for details.   Assess/Plan:

## 2014-12-08 NOTE — Assessment & Plan Note (Signed)
She has had no recurrent syncope or near syncopal episodes since her pacemaker was placed. She is only pacing in the ventricle approximately 20% of the time.

## 2014-12-08 NOTE — Assessment & Plan Note (Signed)
I suspect the patient has surgical disease. She is not certain that she would have mitral valve repair, but she would consider. We have not yet obtained a transesophageal echo. I have recommended that she be referred to see Dr. Roxy Manns for surgical evaluation. If both patient and physician are willing to proceed with mitral valve repair, then we would obtain a left heart catheterization, right heart catheterization, and transesophageal echo. As I told the patient, I would normally be reluctant to refer patient for mitral valve repair at age 81, but her health is otherwise been fairly good. I have begun Lasix and low-dose ACE inhibitor today to try and improve her symptoms. These medications will be uptitrated.

## 2014-12-10 ENCOUNTER — Institutional Professional Consult (permissible substitution) (INDEPENDENT_AMBULATORY_CARE_PROVIDER_SITE_OTHER): Payer: Medicare Other | Admitting: Thoracic Surgery (Cardiothoracic Vascular Surgery)

## 2014-12-10 ENCOUNTER — Encounter: Payer: Self-pay | Admitting: Thoracic Surgery (Cardiothoracic Vascular Surgery)

## 2014-12-10 ENCOUNTER — Other Ambulatory Visit: Payer: Self-pay

## 2014-12-10 VITALS — BP 165/82 | HR 82 | Resp 16 | Ht 65.0 in | Wt 145.0 lb

## 2014-12-10 DIAGNOSIS — I10 Essential (primary) hypertension: Secondary | ICD-10-CM | POA: Insufficient documentation

## 2014-12-10 DIAGNOSIS — I459 Conduction disorder, unspecified: Secondary | ICD-10-CM

## 2014-12-10 DIAGNOSIS — R0602 Shortness of breath: Secondary | ICD-10-CM | POA: Diagnosis not present

## 2014-12-10 DIAGNOSIS — I351 Nonrheumatic aortic (valve) insufficiency: Secondary | ICD-10-CM | POA: Diagnosis not present

## 2014-12-10 DIAGNOSIS — I340 Nonrheumatic mitral (valve) insufficiency: Secondary | ICD-10-CM | POA: Diagnosis not present

## 2014-12-10 DIAGNOSIS — I442 Atrioventricular block, complete: Secondary | ICD-10-CM | POA: Insufficient documentation

## 2014-12-10 MED ORDER — FUROSEMIDE 20 MG PO TABS: 20.0000 mg | ORAL_TABLET | Freq: Every day | ORAL | Status: DC

## 2014-12-10 MED ORDER — LISINOPRIL 2.5 MG PO TABS: 2.5000 mg | ORAL_TABLET | Freq: Every day | ORAL | Status: DC

## 2014-12-10 NOTE — Patient Instructions (Signed)
Contact Dr Lovena Le if you develop increased shortness of breath, fatigue, dizzy spells or feel your heart racing  Schedule an appointment for Dental Service consultation  Endocarditis is a potentially serious infection of heart valves or inside lining of the heart.  It occurs more commonly in patients with diseased heart valves (such as patient's with aortic or mitral valve disease) and in patients who have undergone heart valve repair or replacement.  Certain surgical and dental procedures may put you at risk, such as dental cleaning, other dental procedures, or any surgery involving the respiratory, urinary, gastrointestinal tract, gallbladder or prostate gland.   To minimize your chances for develooping endocarditis, maintain good oral health and seek prompt medical attention for any infections involving the mouth, teeth, gums, skin or urinary tract.  Always notify your doctor or dentist about your underlying heart valve condition before having any invasive procedures. You will need to take antibiotics before certain procedures.

## 2014-12-10 NOTE — Progress Notes (Addendum)
[name]       [name] [address]             [id number]     CARDIOTHORACIC SURGERY CONSULTATION REPORT  Referring Provider is [name], MD PCP is [name], MD  Chief Complaint  Patient presents with  . Shortness of Breath  . Mitral Regurgitation    HPI:  Patient is an 81 year old widowed white female with history of intermittent complete heart block associated with syncope for which she underwent permanent pacemaker placement in 2014. She has been followed by Dr. Lovena Le since that time and was recently noted to have a prominent systolic murmur on physical exam. Transthoracic echocardiogram performed 11/26/2014 revealed severe mitral regurgitation with moderate aortic insufficiency and normal left ventricular systolic function. The patient was referred for elective surgical consultation.  The patient is widowed and has lived alone since her husband passed away approximately 3 years ago following a battle with leukemia. Her husband was a former patient of mine from the distant past. Despite her advanced age, the patient has remained remarkably active and completely functionally independent for all of her life. She continues to drive an automobile and tend to all her necessary personal chores around the house.  She states that over the last few years she has had to slow down a bit because she gets tired more easily than she used to. She admits to some degree of exertional shortness of breath, but this is only associated with more strenuous activity such as going up a flight of stairs. She denies any exertional shortness of breath associated with normal daily activities. She has had occasional dizzy spells but no syncope since her original presenting event prior to pacemaker placement in 2014. She has never had any chest pain or chest tightness. She does not have any history of lower extremity edema.  She does not recall ever having been told that she has  a heart murmur in the past.  She denies any history of rheumatic fever during childhood. She is accompanied to the office for consultation today by a close friend who is very supportive and frequently comes with her for doctor's appointments.  Past Medical History  Diagnosis Date  . Back pain   . UTI (urinary tract infection)   . Hypothyroid   . Diverticulitis   . Liver lesion   . Rectal polyp   . Lung nodule   . Mitral regurgitation 12/08/2014  . Sigmoid diverticulitis 04/23/2013    History of March 2014   . Villous adenoma of rectum 01/16/2013  . 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 Complete heart block 08/31/2013  . Aortic insufficiency   . Hypertension     Past Surgical History  Procedure Laterality Date  . Abdominal hysterectomy      still has ovaries  . Flexible sigmoidoscopy N/A 01/17/2013    Procedure: FLEXIBLE SIGMOIDOSCOPY;  Surgeon: [name], MD;  Location: [facility];  Service: Gastroenterology;  Laterality: N/A;  . Pacemaker placement  09/01/2013    [employer]  . Inner ear surgery  1970's    due to infection  . Permanent pacemaker insertion N/A 09/01/2013    Procedure: PERMANENT PACEMAKER INSERTION;  Surgeon: [name], MD;  Location: [facility] CATH LAB;  Service: Cardiovascular;  Laterality: N/A;    Family History  Problem Relation Age of Onset  . Breast cancer Sister   . Cancer Sister  breast  . Diabetes Sister   . Heart disease Sister   . Heart disease Mother   . Colon cancer Neg Hx   . Heart disease Father     History   Social History  . Marital Status: Widowed    Spouse Name: N/A  . Number of Children: 1  . Years of Education: N/A   Occupational History  . retired    Social History Main Topics  . Smoking status: Never Smoker   . Smokeless tobacco: Never Used  . Alcohol Use: No  . Drug Use: No  . Sexual Activity: Not on file   Other Topics Concern  . Not on file   Social History Narrative    Current Outpatient  Prescriptions  Medication Sig Dispense Refill  . acetaminophen (TYLENOL) 500 MG tablet Take 500 mg by mouth every 6 (six) hours as needed (pain).     Marland Kitchen aspirin 81 MG tablet Take 81 mg by mouth daily as needed for pain.     . Multiple Vitamin (MULTIVITAMIN WITH MINERALS) TABS Take 1 tablet by mouth daily.    Marland Kitchen SYNTHROID 112 MCG tablet TAKE 1 TABLET DAILY 90 tablet 3  . furosemide (LASIX) 20 MG tablet Take 1 tablet (20 mg total) by mouth daily. (Patient not taking: Reported on 12/10/2014) 30 tablet 3  . lisinopril (ZESTRIL) 2.5 MG tablet Take 1 tablet (2.5 mg total) by mouth daily. (Patient not taking: Reported on 12/10/2014) 30 tablet 3   No current facility-administered medications for this visit.    No Known Allergies    Review of Systems:   General:  normal appetite, decreased energy, no weight gain, no weight loss, no fever  Cardiac:  no chest pain with exertion, no chest pain at rest, + SOB with exertion, no resting SOB, no PND, no orthopnea, occasional palpitations, no arrhythmia, no atrial fibrillation, no LE edema, occasional dizzy spells, no syncope since pacemaker placement  Respiratory:  no shortness of breath, no home oxygen, no productive cough, no dry cough, no bronchitis, no wheezing, no hemoptysis, no asthma, no pain with inspiration or cough, no sleep apnea, no CPAP at night  GI:   no difficulty swallowing, no reflux, no frequent heartburn, no hiatal hernia, no abdominal pain, no constipation, no diarrhea, no hematochezia, no hematemesis, no melena  GU:   no dysuria,  no frequency, no urinary tract infection, no hematuria, no kidney stones, no kidney disease  Vascular:  no pain suggestive of claudication, no pain in feet, no leg cramps, no varicose veins, no DVT, no non-healing foot ulcer  Neuro:   no stroke, no TIA's, no seizures, no headaches, no temporary blindness one eye,  no slurred speech, no peripheral neuropathy, no chronic pain, no instability of gait, no  memory/cognitive dysfunction  Musculoskeletal: no arthritis, no joint swelling, no myalgias, no difficulty walking, normal mobility   Skin:   no rash, no itching, no skin infections, no pressure sores or ulcerations  Psych:   no anxiety, no depression, no nervousness, no unusual recent stress  Eyes:   no blurry vision, no floaters, no recent vision changes, + wears glasses for reading  ENT:   + hearing loss, + loose or painful teeth, no dentures, last saw dentist many years ago  Hematologic:  no easy bruising, no abnormal bleeding, no clotting disorder, no frequent epistaxis  Endocrine:  no diabetes, does not check CBG's at home     Physical Exam:   BP 165/82 mmHg  Pulse 82  Resp 16  Ht 5\' 5"  (1.651 m)  Wt 145 lb (65.772 kg)  BMI 24.13 kg/m2  SpO2 98%  General:  Elderly but  well-appearing  HEENT:  Unremarkable   Neck:   no JVD, no bruits, no adenopathy   Chest:   clear to auscultation, symmetrical breath sounds, no wheezes, no rhonchi   CV:   RRR, grade IV/VI holosystolic murmur best at apex w/ radiation to axilla and back  Abdomen:  soft, non-tender, no masses   Extremities:  warm, well-perfused, pulses diminished but palpable, no LE edema  Rectal/GU  Deferred  Neuro:   Grossly non-focal and symmetrical throughout  Skin:   Clean and dry, no rashes, no breakdown   Diagnostic Tests:  Transthoracic Echocardiography  Patient:  [name], [name] MR #:    [id number] Study Date: 11/26/2014 Gender:   F Age:    19 Height:   165.1 cm Weight:   65.3 kg BSA:    1.74 m^2 Pt. Status: Room:  REFERRING  Cristopher Peru, MD SONOGRAPHER Victorio Palm, RDCS PERFORMING  [facility], Outpatient  cc:  ------------------------------------------------------------------- LV EF: 65% -  70%  ------------------------------------------------------------------- Indications:   Mitral valve disease  (I05.9).  ------------------------------------------------------------------- History:  PMH: Acquired from the patient and from the patient&'s chart. Palpitations, dyspnea, and systolic murmur. 3degrees AV block, which has been managed with permanent pacemaker implantation. Mild to Moderate aortic regurgitation. Mild to Moderate mitral regurgitation.  ------------------------------------------------------------------- Study Conclusions  - Left ventricle: The cavity size was normal. There was mild concentric hypertrophy. Systolic function was vigorous. The estimated ejection fraction was in the range of 65% to 70%. Wall motion was normal; there were no regional wall motion abnormalities. Features are consistent with a pseudonormal left ventricular filling pattern, with concomitant abnormal relaxation and increased filling pressure (grade 2 diastolic dysfunction). Doppler parameters are consistent with elevated ventricular end-diastolic filling pressure. - Aortic valve: Transvalvular velocity was within the normal range. There was no stenosis. There was moderate regurgitation. - Mitral valve: Calcified annulus. Moderately thickened leaflets . There was severe regurgitation directed centrally and anteriorly. - Left atrium: The atrium was mildly dilated. - Right ventricle: Systolic function was normal. - Right atrium: The atrium was normal in size. - Tricuspid valve: There was mild-moderate regurgitation. - Pulmonary arteries: Systolic pressure was mildly increased. PA peak pressure: 36 mm Hg (S). - Pericardium, extracardiac: A trivial pericardial effusion was identified. Features were not consistent with tamponade physiology.  Impressions:  - Compared to the prior study from [facility] 2014, mitral regurgitation is now severe.  ------------------------------------------------------------------- Labs, prior tests, procedures, and  surgery: Echocardiography (December 2014).  The mitral valve showed mild to moderate regurgitation. The aortic valve showed mild to moderate regurgitation. EF was 65%.  Permanent pacemaker system implantation. Transthoracic echocardiography. M-mode, complete 2D, spectral Doppler, and color Doppler. Birthdate: Patient birthdate: May 13, 1928. Age: Patient is 81 yr old. Sex: Gender: female. BMI: 24 kg/m^2. Blood pressure:   120/75 Patient status: Outpatient. Study date: Study date: 11/26/2014. Study time: 01:20 PM. Location: [facility]  -------------------------------------------------------------------  ------------------------------------------------------------------- Left ventricle: The cavity size was normal. There was mild concentric hypertrophy. Systolic function was vigorous. The estimated ejection fraction was in the range of 65% to 70%. Wall motion was normal; there were no regional wall motion abnormalities. Features are consistent with a pseudonormal left ventricular filling pattern, with concomitant abnormal relaxation and increased filling pressure (grade 2 diastolic dysfunction). Doppler parameters are consistent with elevated ventricular end-diastolic filling pressure.  ------------------------------------------------------------------- Aortic valve:  Trileaflet; mildly thickened, mildly calcified leaflets. Mobility was not restricted. Doppler: Transvalvular velocity was within the normal range. There was no stenosis. There was moderate regurgitation.  ------------------------------------------------------------------- Aorta: Aortic root: The aortic root was normal in size.  ------------------------------------------------------------------- Mitral valve:  Calcified annulus. Moderately thickened leaflets . Mobility was not restricted. Doppler: Transvalvular velocity was within the normal range. There was no evidence for stenosis.  There was severe regurgitation directed centrally and anteriorly.  Peak gradient (D): 5 mm Hg.  ------------------------------------------------------------------- Left atrium: The atrium was mildly dilated.  ------------------------------------------------------------------- Right ventricle: The cavity size was normal. Wall thickness was normal. Systolic function was normal.  ------------------------------------------------------------------- Pulmonic valve:  Structurally normal valve.  Cusp separation was normal. Doppler: Transvalvular velocity was within the normal range. There was no evidence for stenosis. There was trivial regurgitation.  ------------------------------------------------------------------- Tricuspid valve:  Structurally normal valve.  Doppler: Transvalvular velocity was within the normal range. There was mild-moderate regurgitation.  ------------------------------------------------------------------- Pulmonary artery:  The main pulmonary artery was normal-sized. Systolic pressure was mildly increased.  ------------------------------------------------------------------- Right atrium: The atrium was normal in size.  ------------------------------------------------------------------- Pericardium: A trivial pericardial effusion was identified. Doppler: Features were not consistent with tamponade physiology.  ------------------------------------------------------------------- Systemic veins: Inferior vena cava: The vessel was normal in size.  ------------------------------------------------------------------- Measurements  Left ventricle              Value    Reference LV ID, ED, PLAX chordal     (L)   42.6 mm   43 - 52 LV ID, ES, PLAX chordal         26.2 mm   23 - 38 LV fx shortening, PLAX chordal      38  %   >=29 LV PW thickness, ED           10.8 mm    --------- IVS/LV PW ratio, ED           1.11     <=1.3 Stroke volume, 2D            78  ml   --------- Stroke volume/bsa, 2D          45  ml/m^2 --------- LV e&', lateral              6.58 cm/s  --------- LV E/e&', lateral             17.63    --------- LV e&', medial              6.58 cm/s  --------- LV E/e&', medial             17.63    --------- LV e&', average              6.58 cm/s  --------- LV E/e&', average             17.63    ---------  Ventricular septum            Value    Reference IVS thickness, ED            12  mm   ---------  LVOT                   Value    Reference LVOT ID, S                20  mm   --------- LVOT area                3.14 cm^2  ---------  LVOT ID                 20  mm   --------- LVOT peak velocity, S          117  cm/s  --------- LVOT mean velocity, S          76.7 cm/s  --------- LVOT VTI, S               24.8 cm   --------- LVOT peak gradient, S          5   mm Hg --------- Stroke volume (SV), LVOT DP       77.9 ml   --------- Stroke index (SV/bsa), LVOT DP      44.8 ml/m^2 ---------  Aortic valve               Value    Reference Aortic regurg pressure half-time     392  ms   ---------  Aorta                  Value    Reference Aortic root ID, ED            35  mm   --------- Ascending aorta ID, A-P, S        33  mm   ---------  Left atrium               Value    Reference LA ID, A-P, ES              31  mm   --------- LA ID/bsa, A-P              1.78 cm/m^2 <=2.2 LA volume,  S               65  ml   --------- LA volume/bsa, S             37.4 ml/m^2 --------- LA volume, ES, 1-p A4C          63  ml   --------- LA volume/bsa, ES, 1-p A4C        36.2 ml/m^2 --------- LA volume, ES, 1-p A2C          68  ml   --------- LA volume/bsa, ES, 1-p A2C        39.1 ml/m^2 ---------  Mitral valve               Value    Reference Mitral E-wave peak velocity       116  cm/s  --------- Mitral A-wave peak velocity       93.8 cm/s  --------- Mitral deceleration time         155  ms   150 - 230 Mitral peak gradient, D         5   mm Hg --------- Mitral E/A ratio, peak          1.2     --------- Aliasing velocity, MR PISA        34.4 cm/s  --------- Mitral regurg PISA radius        11  mm   --------- Mitral regurg VTI, PISA         212  cm   --------- Mitral ERO, PISA             0.44 cm^2  --------- Mitral regurg volume, PISA        93  ml   --------- Mitral regurg fraction,  PISA       54.41 %   ---------  Pulmonary arteries            Value    Reference PA pressure, S, DP        (H)   36  mm Hg <=30  Tricuspid valve             Value    Reference Tricuspid regurg peak velocity      284  cm/s  --------- Tricuspid peak RV-RA gradient      32  mm Hg ---------  Systemic veins              Value    Reference Estimated CVP              3   mm Hg ---------  Right ventricle             Value    Reference RV pressure, S, DP        (H)   35  mm Hg <=30 RV s&', lateral, S            14.6 cm/s  ---------  Legend: (L) and (H) mark values outside specified reference  range.  ------------------------------------------------------------------- Prepared and Electronically Authenticated by  Ena Dawley, M.D. 2016-01-28T17:02:42   Impression:  Patient has stage D severe symptomatic primary mitral regurgitation.  She describes stable symptoms of mild exertional shortness of breath and fatigue consistent with chronic diastolic congestive heart failure, [place] Heart Association functional class II.   I have personally reviewed the patient's recent transthoracic echocardiogram.  Functional anatomy of the mitral valve is somewhat difficult to characterize on this transthoracic study, but I believe the patient likely has mitral valve prolapse, potentially with a flail segment of the posterior leaflet. There is a broad jet of regurgitation coursing anteriorly around the left atrium. There is significant thickening and some calcification involving the posterior leaflet and posterior annulus. There is at least moderate aortic insufficiency. Left ventricular size and systolic function remain normal. There is mild to moderate tricuspid regurgitation.  Transesophageal echocardiogram would be helpful to better characterize the functional anatomy of mitral valve disease, the likelihood of mitral valve repair, and the functional anatomy and severity of aortic insufficiency.   Plan:  I have reviewed the patient's recent echocardiogram findings and current symptomatology at length with the patient and her friend in the office today. We discussed treatment options including medical therapy with close follow-up versus elective surgical intervention in the near future. The rationale for surgery was discussed at length as well as the natural history of severe primary mitral regurgitation. The patient is not interested in pursuing further diagnostic workup or surgery at this time.  We discussed the symptoms she should be most concerned about over time and I have offered to see the  patient back in 3 months to see how she is getting along.  If she has any further progression of symptoms I would strongly recommend proceeding with transesophageal echocardiogram and diagnostic cardiac catheterization. In the meanwhile I have suggested that she should have a routine dental exam and cleaning and we have discussed the importance of dental hygiene and the implications for patients with valvular heart disease. She has requested to be referred to the [facility] dental clinic.   I spent in excess of 90 minutes during the conduct of this office consultation and >50% of this time involved direct face-to-face encounter with the patient for counseling and/or coordination of  their care.   Valentina Gu. Roxy Manns, MD 12/10/2014 1:12 PM

## 2014-12-16 ENCOUNTER — Encounter (HOSPITAL_COMMUNITY): Payer: Self-pay | Admitting: Dentistry

## 2014-12-16 ENCOUNTER — Ambulatory Visit (HOSPITAL_COMMUNITY): Payer: Self-pay | Admitting: Dentistry

## 2014-12-16 ENCOUNTER — Encounter (INDEPENDENT_AMBULATORY_CARE_PROVIDER_SITE_OTHER): Payer: Self-pay

## 2014-12-16 VITALS — BP 143/55 | HR 72 | Temp 98.3°F

## 2014-12-16 DIAGNOSIS — M270 Developmental disorders of jaws: Secondary | ICD-10-CM

## 2014-12-16 DIAGNOSIS — K045 Chronic apical periodontitis: Secondary | ICD-10-CM

## 2014-12-16 DIAGNOSIS — I340 Nonrheumatic mitral (valve) insufficiency: Secondary | ICD-10-CM

## 2014-12-16 DIAGNOSIS — M264 Malocclusion, unspecified: Secondary | ICD-10-CM

## 2014-12-16 DIAGNOSIS — Z01818 Encounter for other preprocedural examination: Secondary | ICD-10-CM

## 2014-12-16 DIAGNOSIS — K08409 Partial loss of teeth, unspecified cause, unspecified class: Secondary | ICD-10-CM

## 2014-12-16 DIAGNOSIS — K036 Deposits [accretions] on teeth: Secondary | ICD-10-CM

## 2014-12-16 DIAGNOSIS — I351 Nonrheumatic aortic (valve) insufficiency: Secondary | ICD-10-CM

## 2014-12-16 DIAGNOSIS — K0530 Chronic periodontitis, unspecified: Secondary | ICD-10-CM

## 2014-12-16 DIAGNOSIS — K029 Dental caries, unspecified: Secondary | ICD-10-CM

## 2014-12-16 NOTE — Patient Instructions (Signed)
[place]    Department of Dental Medicine     DR. KULINSKI      HEART VALVES AND MOUTH CARE:  FACTS:   If you have any infection in your mouth, it can infect your heart valve.  If you heart valve is infected, you will be seriously ill.  Infections in the mouth can be SILENT and do not always cause pain.  Examples of infections in the mouth are gum disease, dental cavities, and abscesses.  Some possible signs of infection are: Bad breath, bleeding gums, or teeth that are sensitive to sweets, hot, and/or cold. There are many other signs as well.  WHAT YOU HAVE TO DO:   Brush your teeth after meals and at bedtime. Spend at least 2 minutes brushing well, especially behind your back teeth and all around your teeth that stand alone. Brush at the gumline also.  Do not go to bed without brushing your teeth and flossing.  If you gums bleed when you brush or floss, do NOT stop brushing or flossing. It usually means that your gums need more attention and better cleaning.   If your Dentist or Dr. Kulinski gave you a prescription mouthwash to use, make sure to use it as directed. If you run out of the medication, get a refill at the pharmacy.   If you were given any other medications or directions by your Dentist, please follow them. If you did not understand the directions or forget what you were told, please call. We will be happy to refresh her memory.  If you need antibiotics before dental procedures, make sure you take them one hour prior to every dental visit as directed.   Get a dental checkup every 4-6 months in order to keep your mouth healthy, or to find and treat any new infection. You will most likely need your teeth cleaned or gums treated at the same time.  If you are not able to come in for your scheduled appointment, call your Dentist as soon as possible to reschedule.  If you have a problem in between dental visits, call your Dentist.  

## 2014-12-16 NOTE — Progress Notes (Signed)
DENTAL CONSULTATION  Date of Consultation:  12/16/2014 Patient Name:   [name] [id number] Date of Birth:   [birth year] Medical Record Number: [id number]  VITALS: BP 143/55 mmHg  Pulse 72  Temp(Src) 98.3 F (36.8 C) (Oral)  CHIEF COMPLAINT: Patient was referred for a pre-heart valve surgery dental protocol examination.  HPI: [name] an 81 year old female recently diagnosed with severe mitral regurgitation and aortic insufficiency. Patient with anticipated mitral valve replacement. Patient is now seen as part of a pre-heart valve surgery dental protocol examination to rule out dental infection that may affect the patient's systemic health and anticipated heart valve surgery.  The patient currently denies acute toothaches, swellings, or abscesses. Patient was last seen approximately 3-5 years ago for checkup and filling. Patient saw Dr. Langston Reusing at that time.  Patient currently does not seek regular dental care and only sees the dentist when she needs to.  PROBLEM LIST: Patient Active Problem List   Diagnosis Date Noted  . Stokes-Adams syncope   . Complete heart block   . Aortic insufficiency   . Hypertension   . Mitral regurgitation 12/08/2014  . Palpitations 11/11/2014  . SOB (shortness of breath) 11/11/2014  . Cardiac pacemaker in situ 07/30/2014  . Adenomatous polyp of rectum 07/30/2014  . Osteopenia of the elderly 03/25/2014  . Systolic ejection murmur 71/69/6789  . History of pacemaker 09/04/2013  . Near syncope 08/29/2013  . Fall at home, with facial injury 08/29/2013  . UTI (lower urinary tract infection) 08/29/2013  . Sigmoid diverticulitis 04/23/2013  . Hypothyroidism 04/23/2013  . Chronic back pain 04/23/2013  . Villous adenoma of rectum 01/16/2013  . Mass of left lobe of liver 01/16/2013    PMH: Past Medical History  Diagnosis Date  . Back pain   . UTI (urinary tract infection)   . Hypothyroid   . Diverticulitis   . Liver lesion   . Rectal  polyp   . Lung nodule   . Mitral regurgitation 12/08/2014  . Sigmoid diverticulitis 04/23/2013    History of March 2014   . Villous adenoma of rectum 01/16/2013  . 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 Complete heart block 08/31/2013  . Aortic insufficiency   . Hypertension     PSH: Past Surgical History  Procedure Laterality Date  . Abdominal hysterectomy      still has ovaries  . Flexible sigmoidoscopy N/A 01/17/2013    Procedure: FLEXIBLE SIGMOIDOSCOPY;  Surgeon: [name], MD;  Location: [place];  Service: Gastroenterology;  Laterality: N/A;  . Pacemaker placement  09/01/2013    [employer]tr[employer]  . Inner ear surgery  1970's    due to infection  . Permanent pacemaker insertion N/A 09/01/2013    Procedure: PERMANENT PACEMAKER INSERTION;  Surgeon: [name], MD;  Location: [facility] CATH LAB;  Service: Cardiovascular;  Laterality: N/A;    ALLERGIES: No Known Allergies  MEDICATIONS: Current Outpatient Prescriptions  Medication Sig Dispense Refill  . acetaminophen (TYLENOL) 500 MG tablet Take 500 mg by mouth every 6 (six) hours as needed (pain).     Marland Kitchen aspirin 81 MG tablet Take 81 mg by mouth daily as needed for pain.     . furosemide (LASIX) 20 MG tablet Take 1 tablet (20 mg total) by mouth daily. 90 tablet 2  . lisinopril (ZESTRIL) 2.5 MG tablet Take 1 tablet (2.5 mg total) by mouth daily. 90 tablet 2  . Multiple Vitamin (MULTIVITAMIN WITH MINERALS) TABS Take 1 tablet by mouth  daily.    Marland Kitchen SYNTHROID 112 MCG tablet TAKE 1 TABLET DAILY 90 tablet 3   No current facility-administered medications for this visit.    LABS: Lab Results  Component Value Date   WBC 6.2 07/30/2014   HGB 13.3 07/30/2014   HCT 40.7 07/30/2014   MCV 95.3 07/30/2014   PLT 313.0 10/09/2013      Component Value Date/Time   NA 141 10/06/2014 1323   NA 145* 07/30/2014 1016   K 4.5 10/06/2014 1323   CL 105 10/06/2014 1323   CO2 26 10/06/2014 1323   GLUCOSE 101* 10/06/2014 1323    GLUCOSE 96 07/30/2014 1016   BUN 16 10/06/2014 1323   BUN 14 07/30/2014 1016   CREATININE 1.1 10/06/2014 1323   CALCIUM 10.6* 10/06/2014 1323   GFRNONAA 47* 07/30/2014 1016   GFRAA 54* 07/30/2014 1016   Lab Results  Component Value Date   INR 1.03 01/16/2013   No results found for: PTT  SOCIAL HISTORY: History   Social History  . Marital Status: Widowed    Spouse Name: N/A  . Number of Children: 1  . Years of Education: N/A   Occupational History  . retired    Social History Main Topics  . Smoking status: Never Smoker   . Smokeless tobacco: Never Used  . Alcohol Use: No  . Drug Use: No  . Sexual Activity: Not on file   Other Topics Concern  . Not on file   Social History Narrative    FAMILY HISTORY: Family History  Problem Relation Age of Onset  . Breast cancer Sister   . Cancer Sister     breast  . Diabetes Sister   . Heart disease Sister   . Heart disease Mother   . Colon cancer Neg Hx   . Heart disease Father     REVIEW OF SYSTEMS: Reviewed with the patient and included in the dental record.  DENTAL HISTORY: CHIEF COMPLAINT: Patient was referred for a pre-heart valve surgery dental protocol examination.  HPI: [name] an 81 year old female recently diagnosed with severe mitral regurgitation and aortic insufficiency. Patient with anticipated mitral valve replacement. Patient is now seen as part of a pre-heart valve surgery dental protocol examination to rule out dental infection that may affect the patient's systemic health and anticipated heart valve surgery.  The patient currently denies acute toothaches, swellings, or abscesses. Patient was last seen approximately 3-5 years ago for checkup and filling. Patient saw Dr. Langston Reusing at that time.  Patient currently does not seek regular dental care and only sees the dentist when she needs to.  DENTAL EXAMINATION: GENERAL: The patient is a well-developed, well-nourished female in no acute  distress. HEAD AND NECK: There is no palpable submandibular lymphadenopathy. The patient denies acute TMJ symptoms but has bilateral TMJ crepitus. Patient has a mole on her right cheek that has no recent changes. The patient has a lower lip lesion that is blue and pigmented measuring approximately 3 mm x 3 mm at the patient thinks is due to" trauma". INTRAORAL EXAM: Patient has normal saliva. There is no evidence of oral abscess formation. The patient has bilateral mandibular lingual tori. DENTITION: Patient is missing tooth numbers 1, 4, 16, 19, and 30. PERIODONTAL: Patient has chronic periodontitis with plaque accumulations, selective areas of gingival recession and no significant tooth mobility. DENTAL CARIES/SUBOPTIMAL RESTORATIONS: Patient has multiple dental caries as per dental charting form. ENDODONTIC: The patient currently denies acute pulpitis symptoms. Patient has a  periapical pathology and radiolucency associated with the apex of tooth #31. CROWN AND BRIDGE: Patient has multiple crown restorations. Patient could ideally be evaluated for additional crown restorations. PROSTHODONTIC: Patient has no partial dentures. OCCLUSION: Patient has a poor occlusal scheme but a stable occlusion at this time.  RADIOGRAPHIC INTERPRETATION: An orthopantogram was taken. One additional periapical radiographic of #14 was taken. Patient would not allow additional periapical radiographs. There are multiple missing teeth. There is supra-eruption and drifting of the unopposed teeth into the edentulous areas. Dental caries are noted. There is incipient to moderate bone loss noted. There is a periapical radiolucency associated with tooth #31. Multiple restorations and crowns are noted.  ASSESSMENTS: 1. Severe mitral regurgitation 2. Aortic insufficiency 3. Pre-heart valve surgery dental protocol examination 4. Chronic apical periodontitis 5. Chronic periodontitis of bone loss 6. Accretions 7. Gingival  recession 8. Dental caries 9. Bilateral mandibular lingual tori 10. Supra-eruption and drifting of the unopposed teeth into the edentulous areas 11. Multiple missing teeth 12. Mandibular anterior crowding  13. Poor occlusal scheme but a stable occlusion 14. Cardiovascular compromise with the risk for complications with invasive dental procedures  PLAN/RECOMMENDATIONS: 1. I discussed the risks, benefits, and complications of various treatment options with the patient in relationship to her medical and dental conditions, risk for endocarditis, and anticipated heart valve surgery. We discussed various treatment options to include no treatment, multiple extractions with alveoloplasty, pre-prosthetic surgery as indicated, periodontal therapy, dental restorations, root canal therapy, crown and bridge therapy, implant therapy, and replacement of missing teeth as indicated. We also discussed referral to an oral surgeon for the extraction procedures. We also discussed follow-up with a dentist of her choice for dental restorations and periodontal therapy. The patient currently wishes to think about her options at this time. Patient will contact dental medicine when she wishes to be referred to an oral surgeon for the extraction of tooth numbers 31 and possibly #32 as well. Patient will also call dental medicine with the name of the dentist she wishes to follow-up with concerning continued periodontal therapy and dental restorations and possible crown restorations.   2. Discussion of findings with medical team and coordination of future medical and dental care as needed.  I spent in ecess of 120 minutes during the conduct of this consultation and >50% of this time involved direct face-to-face encounter for counseling and/or coordination of the patient's care.    Lenn Cal, DDS

## 2014-12-17 ENCOUNTER — Other Ambulatory Visit (INDEPENDENT_AMBULATORY_CARE_PROVIDER_SITE_OTHER): Payer: Medicare Other | Admitting: *Deleted

## 2014-12-17 DIAGNOSIS — I340 Nonrheumatic mitral (valve) insufficiency: Secondary | ICD-10-CM

## 2014-12-17 LAB — BASIC METABOLIC PANEL
BUN: 21 mg/dL (ref 6–23)
CHLORIDE: 102 meq/L (ref 96–112)
CO2: 33 mEq/L — ABNORMAL HIGH (ref 19–32)
Calcium: 11.2 mg/dL — ABNORMAL HIGH (ref 8.4–10.5)
Creatinine, Ser: 1.11 mg/dL (ref 0.40–1.20)
GFR: 49.74 mL/min — AB (ref >60.00)
Glucose, Bld: 105 mg/dL — ABNORMAL HIGH (ref 70–99)
POTASSIUM: 4.6 meq/L (ref 3.5–5.1)
SODIUM: 138 meq/L (ref 135–145)

## 2014-12-21 ENCOUNTER — Telehealth (HOSPITAL_COMMUNITY): Payer: Self-pay

## 2014-12-21 NOTE — Telephone Encounter (Signed)
12/21/14            Called to follow up w/patient regarding scheduling dental treatment recommended by Dr. Enrique Sack.  Patient does not want to schedule any treatment at this time.  Will contact Dental Medicine when ready to schedule.  LRI

## 2015-01-05 ENCOUNTER — Encounter (HOSPITAL_COMMUNITY): Payer: Self-pay | Admitting: *Deleted

## 2015-01-05 ENCOUNTER — Observation Stay (HOSPITAL_COMMUNITY)
Admission: EM | Admit: 2015-01-05 | Discharge: 2015-01-06 | Disposition: A | Discharge status: Home / Self Care | Payer: Medicare Other | Attending: Family Medicine | Admitting: Family Medicine

## 2015-01-05 DIAGNOSIS — E039 Hypothyroidism, unspecified: Secondary | ICD-10-CM | POA: Insufficient documentation

## 2015-01-05 DIAGNOSIS — D62 Acute posthemorrhagic anemia: Secondary | ICD-10-CM | POA: Diagnosis not present

## 2015-01-05 DIAGNOSIS — R58 Hemorrhage, not elsewhere classified: Secondary | ICD-10-CM | POA: Diagnosis present

## 2015-01-05 DIAGNOSIS — I10 Essential (primary) hypertension: Secondary | ICD-10-CM | POA: Diagnosis not present

## 2015-01-05 DIAGNOSIS — Z79899 Other long term (current) drug therapy: Secondary | ICD-10-CM | POA: Diagnosis not present

## 2015-01-05 DIAGNOSIS — Z950 Presence of cardiac pacemaker: Secondary | ICD-10-CM | POA: Diagnosis present

## 2015-01-05 DIAGNOSIS — R531 Weakness: Secondary | ICD-10-CM | POA: Diagnosis not present

## 2015-01-05 DIAGNOSIS — N390 Urinary tract infection, site not specified: Secondary | ICD-10-CM | POA: Diagnosis not present

## 2015-01-05 DIAGNOSIS — I83891 Varicose veins of right lower extremities with other complications: Principal | ICD-10-CM | POA: Insufficient documentation

## 2015-01-05 DIAGNOSIS — I351 Nonrheumatic aortic (valve) insufficiency: Secondary | ICD-10-CM | POA: Diagnosis present

## 2015-01-05 DIAGNOSIS — B9689 Other specified bacterial agents as the cause of diseases classified elsewhere: Secondary | ICD-10-CM | POA: Diagnosis not present

## 2015-01-05 DIAGNOSIS — R404 Transient alteration of awareness: Secondary | ICD-10-CM | POA: Diagnosis not present

## 2015-01-05 DIAGNOSIS — I83899 Varicose veins of unspecified lower extremities with other complications: Secondary | ICD-10-CM | POA: Insufficient documentation

## 2015-01-05 DIAGNOSIS — I8392 Asymptomatic varicose veins of left lower extremity: Secondary | ICD-10-CM | POA: Diagnosis not present

## 2015-01-05 LAB — URINALYSIS, ROUTINE W REFLEX MICROSCOPIC
Bilirubin Urine: NEGATIVE
GLUCOSE, UA: NEGATIVE mg/dL
Ketones, ur: 15 mg/dL — AB
Nitrite: POSITIVE — AB
Protein, ur: NEGATIVE mg/dL
SPECIFIC GRAVITY, URINE: 1.017 (ref 1.005–1.030)
Urobilinogen, UA: 0.2 mg/dL (ref 0.0–1.0)
pH: 5.5 (ref 5.0–8.0)

## 2015-01-05 LAB — CBC WITH DIFFERENTIAL/PLATELET
Basophils Absolute: 0 10*3/uL (ref 0.0–0.1)
Basophils Relative: 0 % (ref 0–1)
EOS ABS: 0 10*3/uL (ref 0.0–0.7)
EOS PCT: 0 % (ref 0–5)
HCT: 31.9 % — ABNORMAL LOW (ref 36.0–46.0)
HEMOGLOBIN: 10.7 g/dL — AB (ref 12.0–15.0)
LYMPHS ABS: 0.6 10*3/uL — AB (ref 0.7–4.0)
Lymphocytes Relative: 3 % — ABNORMAL LOW (ref 12–46)
MCH: 32.4 pg (ref 26.0–34.0)
MCHC: 33.5 g/dL (ref 30.0–36.0)
MCV: 96.7 fL (ref 78.0–100.0)
MONOS PCT: 4 % (ref 3–12)
Monocytes Absolute: 0.8 10*3/uL (ref 0.1–1.0)
Neutro Abs: 17.4 10*3/uL — ABNORMAL HIGH (ref 1.7–7.7)
Neutrophils Relative %: 92 % — ABNORMAL HIGH (ref 43–77)
Platelets: 250 10*3/uL (ref 150–400)
RBC: 3.3 MIL/uL — AB (ref 3.87–5.11)
RDW: 15.6 % — ABNORMAL HIGH (ref 11.5–15.5)
WBC: 18.8 10*3/uL — ABNORMAL HIGH (ref 4.0–10.5)

## 2015-01-05 LAB — BASIC METABOLIC PANEL
Anion gap: 3 — ABNORMAL LOW (ref 5–15)
BUN: 18 mg/dL (ref 6–23)
CALCIUM: 9.2 mg/dL (ref 8.4–10.5)
CHLORIDE: 108 mmol/L (ref 96–112)
CO2: 26 mmol/L (ref 19–32)
CREATININE: 0.95 mg/dL (ref 0.50–1.10)
GFR calc Af Amer: 62 mL/min — ABNORMAL LOW (ref >90)
GFR, EST NON AFRICAN AMERICAN: 53 mL/min — AB (ref >90)
Glucose, Bld: 135 mg/dL — ABNORMAL HIGH (ref 70–99)
POTASSIUM: 4.6 mmol/L (ref 3.5–5.1)
Sodium: 137 mmol/L (ref 135–145)

## 2015-01-05 LAB — URINE MICROSCOPIC-ADD ON

## 2015-01-05 LAB — PROTIME-INR
INR: 1.09 (ref 0.00–1.49)
PROTHROMBIN TIME: 14.2 s (ref 11.6–15.2)

## 2015-01-05 LAB — MRSA PCR SCREENING: MRSA by PCR: NEGATIVE

## 2015-01-05 MED ORDER — FOSFOMYCIN TROMETHAMINE 3 G PO PACK
3.0000 g | PACK | Freq: Once | ORAL | Status: AC
  Administered 2015-01-06: 3 g via ORAL
  Filled 2015-01-05: qty 3

## 2015-01-05 MED ORDER — SODIUM CHLORIDE 0.9 % IV SOLN
INTRAVENOUS | Status: DC
  Administered 2015-01-05 – 2015-01-06 (×3): via INTRAVENOUS

## 2015-01-05 MED ORDER — DEXTROSE 5 % IV SOLN
1.0000 g | Freq: Once | INTRAVENOUS | Status: AC
  Administered 2015-01-05: 1 g via INTRAVENOUS
  Filled 2015-01-05: qty 10

## 2015-01-05 MED ORDER — LEVOTHYROXINE SODIUM 112 MCG PO TABS
112.0000 ug | ORAL_TABLET | Freq: Every day | ORAL | Status: DC
  Administered 2015-01-06: 112 ug via ORAL
  Filled 2015-01-05 (×3): qty 1

## 2015-01-05 MED ORDER — ACETAMINOPHEN 500 MG PO TABS
500.0000 mg | ORAL_TABLET | Freq: Four times a day (QID) | ORAL | Status: DC | PRN
  Administered 2015-01-06: 500 mg via ORAL
  Filled 2015-01-05: qty 1

## 2015-01-05 MED ORDER — SODIUM CHLORIDE 0.9 % IV SOLN: INTRAVENOUS | Status: AC

## 2015-01-05 NOTE — Progress Notes (Signed)
Patient admitted and oriented to room. VSS. Oncoming RN to receive report.

## 2015-01-05 NOTE — ED Notes (Addendum)
Pt stated that her right foot started bleeding and "squirting" when she was washing Up.  She denies having any injury to foot.  Pt states she lost a lot of blood because she was unable to get help due to laying on the floor and not being able to call for help.  Denies any other injuries.

## 2015-01-05 NOTE — Progress Notes (Signed)
Report received Narda Rutherford, RN for admission to [phone]

## 2015-01-05 NOTE — ED Provider Notes (Signed)
CSN: [id number]     Arrival date & time 01/05/15  1412 History   First MD Initiated Contact with Patient 01/05/15 1504     Chief Complaint  Patient presents with  . Foot Injury     (Consider location/radiation/quality/duration/timing/severity/associated sxs/prior Treatment) HPI The patient poor she has a varicose pain on the inside of her ankle on her right foot. She was washing up and putting some lotion on her legs and then she looked down and the area was squirting blood. She didn't know how to get it to stop so it continued to bleed until she was able to get help. She denies any injury. She denies lightheadedness dizziness or other acute problems. She reports the medic was able to put a dressing on it and stop it from bleeding. She denies other easy bleeding or recent nosebleeds, blood in her stool or urine. She is not on any blood thinners. Past Medical History  Diagnosis Date  . Back pain   . UTI (urinary tract infection)   . Hypothyroid   . Diverticulitis   . Liver lesion   . Rectal polyp   . Lung nodule   . Mitral regurgitation 12/08/2014  . Sigmoid diverticulitis 04/23/2013    History of March 2014   . Villous adenoma of rectum 01/16/2013  . 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 Complete heart block 08/31/2013  . Aortic insufficiency   . Hypertension    Past Surgical History  Procedure Laterality Date  . Abdominal hysterectomy      still has ovaries  . Flexible sigmoidoscopy N/A 01/17/2013    Procedure: FLEXIBLE SIGMOIDOSCOPY;  Surgeon: [name], MD;  Location: [place];  Service: Gastroenterology;  Laterality: N/A;  . Pacemaker placement  09/01/2013    [employer]tr[employer]  . Inner ear surgery  1970's    due to infection  . Permanent pacemaker insertion N/A 09/01/2013    Procedure: PERMANENT PACEMAKER INSERTION;  Surgeon: [name], MD;  Location: [facility] CATH LAB;  Service: Cardiovascular;  Laterality: N/A;   Family History  Problem Relation Age of Onset   . Breast cancer Sister   . Cancer Sister     breast  . Diabetes Sister   . Heart disease Sister   . Heart disease Mother   . Colon cancer Neg Hx   . Heart disease Father    History  Substance Use Topics  . Smoking status: Never Smoker   . Smokeless tobacco: Never Used  . Alcohol Use: No   OB History    No data available     Review of Systems  10 Systems reviewed and are negative for acute change except as noted in the HPI.   Allergies  Review of patient's allergies indicates no known allergies.  Home Medications   Prior to Admission medications   Medication Sig Start Date End Date Taking? Authorizing Provider  acetaminophen (TYLENOL) 500 MG tablet Take 500 mg by mouth every 6 (six) hours as needed (pain).    Yes Historical Provider, MD  furosemide (LASIX) 20 MG tablet Take 1 tablet (20 mg total) by mouth daily. 12/10/14  Yes [name], MD  lisinopril (ZESTRIL) 2.5 MG tablet Take 1 tablet (2.5 mg total) by mouth daily. 12/10/14  Yes [name], MD  Multiple Vitamin (MULTIVITAMIN WITH MINERALS) TABS Take 1 tablet by mouth daily.   Yes Historical Provider, MD  SYNTHROID 112 MCG tablet TAKE 1 TABLET DAILY 08/07/14  Yes Estella Husk  Laurance Flatten, MD   BP 99/58 mmHg  Pulse 81  Temp(Src) 97.6 F (36.4 C) (Oral)  Resp 15  Ht 5\' 5"  (1.651 m)  Wt 143 lb (64.864 kg)  BMI 23.80 kg/m2  SpO2 99% Physical Exam  Constitutional: She is oriented to person, place, and time. She appears well-developed and well-nourished.  HENT:  Head: Normocephalic and atraumatic.  Eyes: EOM are normal. Pupils are equal, round, and reactive to light.  Neck: Neck supple.  Cardiovascular: Intact distal pulses.   Pulmonary/Chest: Effort normal.  Abdominal: Soft. Bowel sounds are normal. She exhibits no distension. There is no tenderness.  Musculoskeletal: Normal range of motion. She exhibits no edema or tenderness.  There is a dressing on the right lower leg at the area of the ankle. There is no blood  soaking the dressing. Her dorsalis pedis pulses 2+ and strong. The calf is soft and nontender.  Neurological: She is alert and oriented to person, place, and time. She has normal strength. Coordination normal. GCS eye subscore is 4. GCS verbal subscore is 5. GCS motor subscore is 6.  Skin: Skin is warm, dry and intact.  Psychiatric: She has a normal mood and affect.   dressing was removed and the patient had a punctate area on the medial left lower leg superior to the medial malleolus where bleeding had occurred. No active bleeding at this time. The wound was redressed with bacitracin ointment, Vaseline gauze and a nonadherent dressing. ABG pad was placed and Curlex used to create a stirrup dressing. This was then covered with a stirrup Ace dressing. The patient found this comfortable and the dorsalis pedis pulses 2+.  ED Course  Procedures (including critical care time) Labs Review Labs Reviewed  CBC WITH DIFFERENTIAL/PLATELET - Abnormal; Notable for the following:    WBC 18.8 (*)    RBC 3.30 (*)    Hemoglobin 10.7 (*)    HCT 31.9 (*)    RDW 15.6 (*)    Neutrophils Relative % 92 (*)    Neutro Abs 17.4 (*)    Lymphocytes Relative 3 (*)    Lymphs Abs 0.6 (*)    All other components within normal limits  BASIC METABOLIC PANEL - Abnormal; Notable for the following:    Glucose, Bld 135 (*)    GFR calc non Af Amer 53 (*)    GFR calc Af Amer 62 (*)    Anion gap 3 (*)    All other components within normal limits  URINALYSIS, ROUTINE W REFLEX MICROSCOPIC - Abnormal; Notable for the following:    APPearance TURBID (*)    Hgb urine dipstick MODERATE (*)    Ketones, ur 15 (*)    Nitrite POSITIVE (*)    Leukocytes, UA LARGE (*)    All other components within normal limits  URINE MICROSCOPIC-ADD ON - Abnormal; Notable for the following:    Bacteria, UA MANY (*)    All other components within normal limits  PROTIME-INR  URINALYSIS, ROUTINE W REFLEX MICROSCOPIC    Imaging Review No results  found.   EKG Interpretation None     Procedure: Dressing placement by M.D. see above note. MDM   Final diagnoses:  Varicose veins of leg with complications, right  Bleeding  UTI (lower urinary tract infection)  Acute blood loss anemia   The patient presents with a spontaneously bleeding lower extremity varicosity. Due to difficulty applying pressure and moving the patient spent several hours with the injury bleeding and has a blood loss anemia.  Incidentally found as well is UTI and leukocytosis. The patient has been experiencing some dysuria and general fatigue and weakness. At this time the patient will be placed in observation for continued vital sign monitoring and treatment of UTI with weakness and leukocytosis.    Charlesetta Shanks, MD 01/05/15 [phone]

## 2015-01-05 NOTE — H&P (Signed)
[facility] Admission History and Physical Service Pager: [phone]  Patient name: [name] record number: [id number] Date of birth: 04/01/1928 Age: 81 y.o. Gender: female  Primary Care Provider: [name], MD Consultants: None Code Status: Full  Chief Complaint: Bleeding presumed from varicose vein of right leg  Assessment and Plan: [name] is a 81 y.o. female presenting with bleeding from ruptured varicose vein. PMH is significant for hypothyroidim, HTN, pacemaker, and aortic insufficieny.  Bleeding 2/2 ruptured tortuous vein: Hx of varicose veins and spider angiomas. No prior bleeds. Not on blood thinners. Currently stable with no active bleeding. -Admit for observation under Dr. Erin Hearing -wound care -monitor for continued bleeds -vitals per floor protocol  Anemia 2/2 blood loss: Hbg baseline around 13-14. On admission Hbg 10.7. Orthostatic with blood loss as well. -Repeat CBC in AM -consider transfusion if worsening anemia  UTI: UA concerning for UTI with many bacteria, large leukocytes, and nitrite positive. WBC 18.8. Afebrile. Patient with symptomatic cysitis. -s/p 1g Rocephin in ED -will treat with one time dose of fosfomycin -defer urine culture at this time as pt has already had abx  HTN: Normotensive BPs. -Will hold lisinopril and Lasix at this time -consider restarting if become elevated  Hypothyroidism: last TSH 07/2014 1.49. -continue Synthroid  FEN/GI: regular diet/ mIVF Prophylaxis: SCD only on Left (with active bleed on R, do not want to give pharmocologic VTE prophylaxis or place SCD on that leg at this time)  Disposition: Admit to FPTS  History of Present Illness: [name] is a 81 y.o. female presenting with bleeding from ruptured varicose vein. PMH is significant for hypothyroidim, HTN, pacemaker, and aortic insufficieny.  Patient states that around 9:30 AM this morning she was giving  herself a sponge bath which she noticed that her right ankle was bleeding. She states that the blood was squirting out and she could not stop the bleed. When she looked at the area she said it looked like there was a hole. She denies any pain or swelling in her legs at this time. Denies injury to area.  Patient says she was bleeding for about 3 hours; she is unable to quantify the bleeding but thinks it was "maybe about a cup" or "a little more." She describes having to lay on the floor for this time because she was unable stop the bleeding and get help. Patient's friend went to check on patient at home and called EMS. EMS was able to stop the bleed. Patient has history of varicose and spider veins but never has had them bleed like this. Otherwise denies any further bleeding history. She is not on blood thinners. Patient endorses feeling weak and fatigued. Also of note patient states that she has noticed some burning, foul smell, and intermittent pain with urination; these symptoms have been present about 1 week and are "not bad, but there."   Review Of Systems: Per HPI.  Otherwise 12 point review of systems was performed and was unremarkable.  Patient Active Problem List   Diagnosis Date Noted  . Stokes-Adams syncope   . Complete heart block   . Aortic insufficiency   . Hypertension   . Mitral regurgitation 12/08/2014  . Palpitations 11/11/2014  . SOB (shortness of breath) 11/11/2014  . Cardiac pacemaker in situ 07/30/2014  . Adenomatous polyp of rectum 07/30/2014  . Osteopenia of the elderly 03/25/2014  . Systolic ejection murmur 39/76/7341  . History of pacemaker 09/04/2013  . Near syncope  08/29/2013  . Fall at home, with facial injury 08/29/2013  . UTI (lower urinary tract infection) 08/29/2013  . Sigmoid diverticulitis 04/23/2013  . Hypothyroidism 04/23/2013  . Chronic back pain 04/23/2013  . Villous adenoma of rectum 01/16/2013  . Mass of left lobe of liver 01/16/2013   Past Medical  History: Past Medical History  Diagnosis Date  . Back pain   . UTI (urinary tract infection)   . Hypothyroid   . Diverticulitis   . Liver lesion   . Rectal polyp   . Lung nodule   . Mitral regurgitation 12/08/2014  . Sigmoid diverticulitis 04/23/2013    History of March 2014   . Villous adenoma of rectum 01/16/2013  . 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 Complete heart block 08/31/2013  . Aortic insufficiency   . Hypertension    Past Surgical History: Past Surgical History  Procedure Laterality Date  . Abdominal hysterectomy      still has ovaries  . Flexible sigmoidoscopy N/A 01/17/2013    Procedure: FLEXIBLE SIGMOIDOSCOPY;  Surgeon: [name], MD;  Location: [place];  Service: Gastroenterology;  Laterality: N/A;  . Pacemaker placement  09/01/2013    [employer]  . Inner ear surgery  1970's    due to infection  . Permanent pacemaker insertion N/A 09/01/2013    Procedure: PERMANENT PACEMAKER INSERTION;  Surgeon: [name], MD;  Location: [facility] CATH LAB;  Service: Cardiovascular;  Laterality: N/A;   Social History: History  Substance Use Topics  . Smoking status: Never Smoker   . Smokeless tobacco: Never Used  . Alcohol Use: No   Additional social history: Lives alone. Please also refer to relevant sections of EMR.  Family History: Family History  Problem Relation Age of Onset  . Breast cancer Sister   . Cancer Sister     breast  . Diabetes Sister   . Heart disease Sister   . Heart disease Mother   . Colon cancer Neg Hx   . Heart disease Father    Allergies and Medications: No Known Allergies No current facility-administered medications on file prior to encounter.   Current Outpatient Prescriptions on File Prior to Encounter  Medication Sig Dispense Refill  . acetaminophen (TYLENOL) 500 MG tablet Take 500 mg by mouth every 6 (six) hours as needed (pain).     . furosemide (LASIX) 20 MG tablet Take 1 tablet (20 mg total) by mouth daily. 90  tablet 2  . lisinopril (ZESTRIL) 2.5 MG tablet Take 1 tablet (2.5 mg total) by mouth daily. 90 tablet 2  . Multiple Vitamin (MULTIVITAMIN WITH MINERALS) TABS Take 1 tablet by mouth daily.    Marland Kitchen SYNTHROID 112 MCG tablet TAKE 1 TABLET DAILY 90 tablet 3    Objective: BP 99/58 mmHg  Pulse 81  Temp(Src) 97.6 F (36.4 C) (Oral)  Resp 15  Ht 5\' 5"  (1.651 m)  Wt 143 lb (64.864 kg)  BMI 23.80 kg/m2  SpO2 99% Exam: Constitutional: Well-appearing elderly women, in NAD, alert, pleasant  Head: Normocephalic and atraumatic.  Eyes: EOM intact. Vision grossly intact, no injection and anteric. Cardiovascular: Intact distal pulses. RRR. 2/6 systolic murmur. Pulmonary/Chest: Effort normal. CTAB no wheezes.  Abdominal: Soft and nontender. Bowel sounds are normal. She exhibits no distension. There is no tenderness.  Extremities: Normal range of motion. She exhibits no edema or tenderness.  There is a dressing on the right lower leg at the area of the ankle. There is no blood soaking  the dressing. Upon removal of dressing, there is a shallow, ulcer-like punctate area on top of torturous vein on medial left lower leg near medial malleolus.  Neurological: No focal deficits, A&Ox3. Skin: Skin is warm, dry and intact.   Labs and Imaging: Results for orders placed or performed during the hospital encounter of 01/05/15 (from the past 24 hour(s))  CBC with Differential     Status: Abnormal   Collection Time: 01/05/15  3:00 PM  Result Value Ref Range   WBC 18.8 (H) 4.0 - 10.5 K/uL   RBC 3.30 (L) 3.87 - 5.11 MIL/uL   Hemoglobin 10.7 (L) 12.0 - 15.0 g/dL   HCT 31.9 (L) 36.0 - 46.0 %   MCV 96.7 78.0 - 100.0 fL   MCH 32.4 26.0 - 34.0 pg   MCHC 33.5 30.0 - 36.0 g/dL   RDW 15.6 (H) 11.5 - 15.5 %   Platelets 250 150 - 400 K/uL   Neutrophils Relative % 92 (H) 43 - 77 %   Neutro Abs 17.4 (H) 1.7 - 7.7 K/uL   Lymphocytes Relative 3 (L) 12 - 46 %   Lymphs Abs 0.6 (L) 0.7 - 4.0 K/uL   Monocytes Relative 4 3 - 12  %   Monocytes Absolute 0.8 0.1 - 1.0 K/uL   Eosinophils Relative 0 0 - 5 %   Eosinophils Absolute 0.0 0.0 - 0.7 K/uL   Basophils Relative 0 0 - 1 %   Basophils Absolute 0.0 0.0 - 0.1 K/uL  Basic metabolic panel     Status: Abnormal   Collection Time: 01/05/15  3:20 PM  Result Value Ref Range   Sodium 137 135 - 145 mmol/L   Potassium 4.6 3.5 - 5.1 mmol/L   Chloride 108 96 - 112 mmol/L   CO2 26 19 - 32 mmol/L   Glucose, Bld 135 (H) 70 - 99 mg/dL   BUN 18 6 - 23 mg/dL   Creatinine, Ser 0.95 0.50 - 1.10 mg/dL   Calcium 9.2 8.4 - 10.5 mg/dL   GFR calc non Af Amer 53 (L) >90 mL/min   GFR calc Af Amer 62 (L) >90 mL/min   Anion gap 3 (L) 5 - 15  Protime-INR     Status: None   Collection Time: 01/05/15  3:20 PM  Result Value Ref Range   Prothrombin Time 14.2 11.6 - 15.2 seconds   INR 1.09 0.00 - 1.49  Urinalysis, Routine w reflex microscopic     Status: Abnormal   Collection Time: 01/05/15  4:15 PM  Result Value Ref Range   Color, Urine YELLOW YELLOW   APPearance TURBID (A) CLEAR   Specific Gravity, Urine 1.017 1.005 - 1.030   pH 5.5 5.0 - 8.0   Glucose, UA NEGATIVE NEGATIVE mg/dL   Hgb urine dipstick MODERATE (A) NEGATIVE   Bilirubin Urine NEGATIVE NEGATIVE   Ketones, ur 15 (A) NEGATIVE mg/dL   Protein, ur NEGATIVE NEGATIVE mg/dL   Urobilinogen, UA 0.2 0.0 - 1.0 mg/dL   Nitrite POSITIVE (A) NEGATIVE   Leukocytes, UA LARGE (A) NEGATIVE  Urine microscopic-add on     Status: Abnormal   Collection Time: 01/05/15  4:15 PM  Result Value Ref Range   Squamous Epithelial / LPF RARE RARE   WBC, UA TOO NUMEROUS TO COUNT <3 WBC/hpf   RBC / HPF 7-10 <3 RBC/hpf   Bacteria, UA MANY (A) RARE   Urine-Other MUCOUS PRESENT     Katheren Shams, DO 01/05/2015, 5:54 PM PGY-1, [place]  [place] Intern pager: [phone], text pages welcome  FPTS Upper-Level Resident Addendum  I have independently interviewed and examined the patient. I have discussed the above with Dr. Gerarda Fraction and  agree with her documentation as above. The above reflects her original note with my edits for correction/additions/clarification in orange. Please see also any attending notes.   Emmaline Kluver, MD PGY-3, [place] Service pager: [phone] (text pages welcome through [facility])

## 2015-01-05 NOTE — ED Notes (Signed)
Cleaned patient hands with soap and water.

## 2015-01-06 DIAGNOSIS — I83899 Varicose veins of unspecified lower extremities with other complications: Secondary | ICD-10-CM | POA: Insufficient documentation

## 2015-01-06 DIAGNOSIS — I83891 Varicose veins of right lower extremities with other complications: Secondary | ICD-10-CM | POA: Diagnosis not present

## 2015-01-06 DIAGNOSIS — D62 Acute posthemorrhagic anemia: Secondary | ICD-10-CM | POA: Diagnosis not present

## 2015-01-06 DIAGNOSIS — N390 Urinary tract infection, site not specified: Secondary | ICD-10-CM

## 2015-01-06 LAB — CBC
HCT: 27.8 % — ABNORMAL LOW (ref 36.0–46.0)
HEMOGLOBIN: 9.3 g/dL — AB (ref 12.0–15.0)
MCH: 32.9 pg (ref 26.0–34.0)
MCHC: 33.5 g/dL (ref 30.0–36.0)
MCV: 98.2 fL (ref 78.0–100.0)
PLATELETS: 230 10*3/uL (ref 150–400)
RBC: 2.83 MIL/uL — ABNORMAL LOW (ref 3.87–5.11)
RDW: 15.9 % — ABNORMAL HIGH (ref 11.5–15.5)
WBC: 10.3 10*3/uL (ref 4.0–10.5)

## 2015-01-06 LAB — BASIC METABOLIC PANEL
Anion gap: 4 — ABNORMAL LOW (ref 5–15)
BUN: 14 mg/dL (ref 6–23)
CO2: 25 mmol/L (ref 19–32)
Calcium: 9.1 mg/dL (ref 8.4–10.5)
Chloride: 110 mmol/L (ref 96–112)
Creatinine, Ser: 1.02 mg/dL (ref 0.50–1.10)
GFR calc Af Amer: 57 mL/min — ABNORMAL LOW (ref >90)
GFR calc non Af Amer: 49 mL/min — ABNORMAL LOW (ref >90)
GLUCOSE: 114 mg/dL — AB (ref 70–99)
Potassium: 4.3 mmol/L (ref 3.5–5.1)
Sodium: 139 mmol/L (ref 135–145)

## 2015-01-06 NOTE — Care Management Note (Signed)
    Page 1 of 1   01/06/2015     12:14:31 PM CARE MANAGEMENT NOTE 01/06/2015  Patient:  [name], [name]   Account Number:  [id number]  Date Initiated:  01/06/2015  Documentation initiated by:  Tomi Bamberger  Subjective/Objective Assessment:   admit as observation- lives alone.     Action/Plan:   Anticipated DC Date:  01/06/2015   Anticipated DC Plan:  [place]  CM consult      Choice offered to / List presented to:             Status of service:  Completed, signed off Medicare Important Message given?  NA - LOS <3 / Initial given by admissions (If response is "NO", the following Medicare IM given date fields will be blank) Date Medicare IM given:   Medicare IM given by:   Date Additional Medicare IM given:   Additional Medicare IM given by:    Discharge Disposition:  HOME/SELF CARE  Per UR Regulation:  Reviewed for med. necessity/level of care/duration of stay  If discussed at [place] of Stay Meetings, dates discussed:    Comments:  01/06/15 [place], BSN [phone] no needs.

## 2015-01-06 NOTE — Progress Notes (Signed)
UR completed 

## 2015-01-06 NOTE — Discharge Instructions (Signed)
We would like to ask you to make sure to keep your appointment we have setup with your PCP's office this coming Friday. At this visit we believe it is important to have you blood rechecked to make sure you continue to be stable.  We have sent you home with a fresh new bandage on your leg. Please leave this dressing on until your appointment with your PCP.  Anemia, Nonspecific Anemia is a condition in which the concentration of red blood cells or hemoglobin in the blood is below normal. Hemoglobin is a substance in red blood cells that carries oxygen to the tissues of the body. Anemia results in not enough oxygen reaching these tissues.  CAUSES  Common causes of anemia include:   Excessive bleeding. Bleeding may be internal or external. This includes excessive bleeding from periods (in women) or from the intestine.   Poor nutrition.   Chronic kidney, thyroid, and liver disease.  Bone marrow disorders that decrease red blood cell production.  Cancer and treatments for cancer.  HIV, AIDS, and their treatments.  Spleen problems that increase red blood cell destruction.  Blood disorders.  Excess destruction of red blood cells due to infection, medicines, and autoimmune disorders. SIGNS AND SYMPTOMS   Minor weakness.   Dizziness.   Headache.  Palpitations.   Shortness of breath, especially with exercise.   Paleness.  Cold sensitivity.  Indigestion.  Nausea.  Difficulty sleeping.  Difficulty concentrating. Symptoms may occur suddenly or they may develop slowly.  DIAGNOSIS  Additional blood tests are often needed. These help your health care provider determine the best treatment. Your health care provider will check your stool for blood and look for other causes of blood loss.  TREATMENT  Treatment varies depending on the cause of the anemia. Treatment can include:   Supplements of iron, vitamin Q22, or folic acid.   Hormone medicines.   A blood transfusion.  This may be needed if blood loss is severe.   Hospitalization. This may be needed if there is significant continual blood loss.   Dietary changes.  Spleen removal. HOME CARE INSTRUCTIONS Keep all follow-up appointments. It often takes many weeks to correct anemia, and having your health care provider check on your condition and your response to treatment is very important. SEEK IMMEDIATE MEDICAL CARE IF:   You develop extreme weakness, shortness of breath, or chest pain.   You become dizzy or have trouble concentrating.  You develop heavy vaginal bleeding.   You develop a rash.   You have bloody or black, tarry stools.   You faint.   You vomit up blood.   You vomit repeatedly.   You have abdominal pain.  You have a fever or persistent symptoms for more than 2-3 days.   You have a fever and your symptoms suddenly get worse.   You are dehydrated.  MAKE SURE YOU:  Understand these instructions.  Will watch your condition.  Will get help right away if you are not doing well or get worse. Document Released: 11/23/2004 Document Revised: 06/18/2013 Document Reviewed: 04/11/2013 [facility] Patient Information 2015 [place], [place]. This information is not intended to replace advice given to you by your health care provider. Make sure you discuss any questions you have with your health care provider.

## 2015-01-06 NOTE — Progress Notes (Signed)
Cresenciano Lick Benak to be D/C'd Home per MD order.  Discussed with the patient and all questions fully answered.    Medication List    TAKE these medications        acetaminophen 500 MG tablet  Commonly known as:  TYLENOL  Take 500 mg by mouth every 6 (six) hours as needed (pain).     furosemide 20 MG tablet  Commonly known as:  LASIX  Take 1 tablet (20 mg total) by mouth daily.     lisinopril 2.5 MG tablet  Commonly known as:  ZESTRIL  Take 1 tablet (2.5 mg total) by mouth daily.     multivitamin with minerals Tabs tablet  Take 1 tablet by mouth daily.     SYNTHROID 112 MCG tablet  Generic drug:  levothyroxine  TAKE 1 TABLET DAILY        VVS. New dressing applied ro right leg wound. Patient refusing ted hose, education provided. MD made aware and verbal order to place ace wrap instead.  IV catheter discontinued intact. Site without signs and symptoms of complications. Dressing and pressure applied.  An After Visit Summary was printed and given to the patient.  D/c education completed with patient/family including follow up instructions, medication list, d/c activities limitations if indicated, with other d/c instructions as indicated by MD - patient able to verbalize understanding, all questions fully answered.   Patient instructed to return to ED, call 911, or call MD for any changes in condition.   Patient escorted via [place], and D/C home via private auto.  Delman Cheadle 01/06/2015 12:00 PM

## 2015-01-06 NOTE — Discharge Summary (Signed)
[facility] Discharge Summary  Patient name: [name] record number: [id number] Date of birth: 1928-10-01 Age: 81 y.o. Gender: female Date of Admission: 01/05/2015  Date of Discharge: 01/06/2015 Admitting Physician: Lind Covert, MD  Primary Care Provider: [name], MD Consultants: none  Indication for Hospitalization: Bleeding lesion on right leg  Discharge Diagnoses/Problem List:  Bleeding lesion from varicose vein, right lower leg Anemia, acute blood loss UTI Hypertension Hypothyroidism  Disposition: Home  Discharge Condition: Stable  Discharge Exam:  Constitutional: Well-appearing elderly women, in NAD, alert, pleasant  Head: Normocephalic and atraumatic.  Cardiovascular: Intact distal pulses. RRR. 2/6 systolic murmur. Pulmonary/Chest: Effort normal. CTAB no wheezes.  Abdominal: Soft and nontender. Bowel sounds are normal. She exhibits no distension. There is no tenderness.  Extremities: Normal range of motion. She exhibits no edema or tenderness.  There is a dressing on the right lower leg at the area of the ankle. There is no blood soaking the dressing. An ulcer-like punctate area on top of torturous vein on medial left lower leg near medial malleolus. Non-bleeding at this time Neurological: No focal deficits, A&Ox3. Skin: Skin is warm, dry and intact.   Brief Hospital Course:  Patient is a 81 year old female who presented with bleeding from a suspected ruptured varicose vein. She states that she was having a sponge bath and she noticed bleeding from her right ankle. She states that she had not even again washing this extremity, but it just began bleeding. Patient attempted to hold pressure on this bleeding area for several hours without success of hemostasis. At that time a friend called EMS and patient was taken to the ED.  In the ED patient endorsed fatigue and weakness. Orthostatic vital signs were  positive/unstable. Home Lasix was held due to patient's soft pressures. At the time of presentation to the ED hemostasis was obtained. Lesion was dressed and patient was admitted for observation. CBC showed a hemoglobin of 10.7 on admission. This was significantly different from her baseline of approximately 13.5. The following morning repeat CBC showed a hemoglobin of 9.3. However it was deemed likely that this was secondary to delusional factors as all cell lines also decreased.  Patient was asymptomatic on 3/9. Wound was hemodynamically stable. Compression stockings were ordered and patient's wound was redressed prior to discharge. Patient was deemed medically stable for discharge with close follow-up with PCP. Follow-up appointment was made for Friday, March 11.  Issues for Follow Up:  1. Patient has had a follow-up appointment with her PCP's office in 48 hours. We strongly recommend consideration for repeat CBC at that time to assess stable hemoglobin. 2. Follow-up appointment should include wound recheck with possible dressing change. 3. Due to patient's age we are skeptical that anemia is solely caused by this bleeding lesion. Consideration for further workup for anemia by PCP may be warranted. 4. Patient was discharged with compression stockings. This may help prevent further issues from varicose veins. However if placed improperly these may cause current lesion to become hemodynamically unstable.  Significant Procedures: None  Significant Labs and Imaging:   Recent Labs Lab 01/05/15 1500 01/06/15 0701  WBC 18.8* 10.3  HGB 10.7* 9.3*  HCT 31.9* 27.8*  PLT 250 230    Recent Labs Lab 01/05/15 1520 01/06/15 0701  NA 137 139  K 4.6 4.3  CL 108 110  CO2 26 25  GLUCOSE 135* 114*  BUN 18 14  CREATININE 0.95 1.02  CALCIUM 9.2 9.1  Results/Tests Pending at Time of Discharge: None  Discharge Medications:    Medication List    TAKE these medications        acetaminophen  500 MG tablet  Commonly known as:  TYLENOL  Take 500 mg by mouth every 6 (six) hours as needed (pain).     furosemide 20 MG tablet  Commonly known as:  LASIX  Take 1 tablet (20 mg total) by mouth daily.     lisinopril 2.5 MG tablet  Commonly known as:  ZESTRIL  Take 1 tablet (2.5 mg total) by mouth daily.     multivitamin with minerals Tabs tablet  Take 1 tablet by mouth daily.     SYNTHROID 112 MCG tablet  Generic drug:  levothyroxine  TAKE 1 TABLET DAILY        Discharge Instructions: Please refer to Patient Instructions section of EMR for full details.  Patient was counseled important signs and symptoms that should prompt return to medical care, changes in medications, dietary instructions, activity restrictions, and follow up appointments.   Follow-Up Appointments: Follow-up Information    Follow up with [name], MD. Go on 01/08/2015.   Specialty:  Family Medicine   Why:  @ 4:30pm   Contact information:   [place] [address] [phone]       Elberta Leatherwood, MD 01/06/2015, 11:46 AM PGY-1, [place]

## 2015-01-08 ENCOUNTER — Ambulatory Visit (INDEPENDENT_AMBULATORY_CARE_PROVIDER_SITE_OTHER): Payer: Medicare Other | Admitting: Nurse Practitioner

## 2015-01-08 ENCOUNTER — Encounter: Payer: Self-pay | Admitting: Nurse Practitioner

## 2015-01-08 VITALS — BP 142/58 | HR 94 | Temp 98.3°F | Ht 65.0 in | Wt 141.0 lb

## 2015-01-08 DIAGNOSIS — D62 Acute posthemorrhagic anemia: Secondary | ICD-10-CM | POA: Diagnosis not present

## 2015-01-08 LAB — POCT HEMOGLOBIN: Hemoglobin: 8 g/dL — AB (ref 12.2–16.2)

## 2015-01-08 NOTE — Progress Notes (Signed)
   Subjective:    Patient ID: [name], female    DOB: 02-04-1928, 81 y.o.   MRN: [id number]  HPI The patient is following up at being admitted in the hospital 3 days ago overnight for acute blood loss anemia related to a bleeding varicose vein of her right ankle. Patient reports feeling fatigued since being discharged from the hospital.     Review of Systems  Constitutional: Positive for fatigue.  Respiratory: Negative for chest tightness and shortness of breath.   Cardiovascular: Positive for leg swelling. Negative for chest pain and palpitations.  Skin: Positive for color change.       Objective:   Physical Exam  Constitutional: She appears well-developed and well-nourished.  Cardiovascular: A regularly irregular rhythm present.  Murmur (2/6 blowing systolic murmer) heard.  Crescendo systolic murmur is present with a grade of 2/6  Pulses:      Dorsalis pedis pulses are 3+ on the right side, and 3+ on the left side.       Posterior tibial pulses are 3+ on the right side, and 3+ on the left side.  Brisk cap refill bil feet Multiple lower ext varacosities  Pulmonary/Chest: Effort normal and breath sounds normal. No respiratory distress.  Musculoskeletal:       Right shoulder: She exhibits swelling.       Right ankle: She exhibits swelling. She exhibits normal pulse. Tenderness.  Skin: Skin is warm and dry. She is not diaphoretic. No pallor.    BP 142/58 mmHg  Pulse 94  Temp(Src) 98.3 F (36.8 C) (Oral)  Ht 5\' 5"  (1.651 m)  Wt 141 lb (63.957 kg)  BMI 23.46 kg/m2       Assessment & Plan:   1. Acute blood loss anemia    Diet high in iron RTO in 1 week to recheck hgb  Hailynn-Margaret Hassell Done, FNP

## 2015-01-08 NOTE — Addendum Note (Signed)
Addended by: Chevis Pretty on: 01/08/2015 05:13 PM   Modules accepted: Orders

## 2015-01-08 NOTE — Patient Instructions (Signed)
Iron-Rich Diet An iron-rich diet contains foods that are good sources of iron. Iron is an important mineral that helps your body produce hemoglobin. Hemoglobin is a protein in red blood cells that carries oxygen to the body's tissues. Sometimes, the iron level in your blood can be low. This may be caused by:  A lack of iron in your diet.  Blood loss.  Times of growth, such as during pregnancy or during a child's growth and development. Low levels of iron can cause a decrease in the number of red blood cells. This can result in iron deficiency anemia. Iron deficiency anemia symptoms include:  Tiredness.  Weakness.  Irritability.  Increased chance of infection. Here are some recommendations for daily iron intake:  Males older than 79 years of age need 8 mg of iron per day.  Women ages 19 to 50 need 18 mg of iron per day.  Pregnant women need 27 mg of iron per day, and women who are over 19 years of age and breastfeeding need 9 mg of iron per day.  Women over the age of 50 need 8 mg of iron per day. SOURCES OF IRON There are 2 types of iron that are found in food: heme iron and nonheme iron. Heme iron is absorbed by the body better than nonheme iron. Heme iron is found in meat, poultry, and fish. Nonheme iron is found in grains, beans, and vegetables. Heme Iron Sources Food / Iron (mg)  Chicken liver, 3 oz (85 g)/ 10 mg  Beef liver, 3 oz (85 g)/ 5.5 mg  Oysters, 3 oz (85 g)/ 8 mg  Beef, 3 oz (85 g)/ 2 to 3 mg  Shrimp, 3 oz (85 g)/ 2.8 mg  Turkey, 3 oz (85 g)/ 2 mg  Chicken, 3 oz (85 g) / 1 mg  Fish (tuna, halibut), 3 oz (85 g)/ 1 mg  Pork, 3 oz (85 g)/ 0.9 mg Nonheme Iron Sources Food / Iron (mg)  Ready-to-eat breakfast cereal, iron-fortified / 3.9 to 7 mg  Tofu,  cup / 3.4 mg  Kidney beans,  cup / 2.6 mg  Baked potato with skin / 2.7 mg  Asparagus,  cup / 2.2 mg  Avocado / 2 mg  Dried peaches,  cup / 1.6 mg  Raisins,  cup / 1.5 mg  Soy milk, 1 cup  / 1.5 mg  Whole-wheat bread, 1 slice / 1.2 mg  Spinach, 1 cup / 0.8 mg  Broccoli,  cup / 0.6 mg IRON ABSORPTION Certain foods can decrease the body's absorption of iron. Try to avoid these foods and beverages while eating meals with iron-containing foods:  Coffee.  Tea.  Fiber.  Soy. Foods containing vitamin C can help increase the amount of iron your body absorbs from iron sources, especially from nonheme sources. Eat foods with vitamin C along with iron-containing foods to increase your iron absorption. Foods that are high in vitamin C include many fruits and vegetables. Some good sources are:  Fresh orange juice.  Oranges.  Strawberries.  Mangoes.  Grapefruit.  Red bell peppers.  Green bell peppers.  Broccoli.  Potatoes with skin.  Tomato juice. Document Released: 05/30/2005 Document Revised: 01/08/2012 Document Reviewed: 04/06/2011 ExitCare Patient Information 2015 [employer]. This information is not intended to replace advice given to you by your health care provider. Make sure you discuss any questions you have with your health care provider.  

## 2015-01-11 ENCOUNTER — Other Ambulatory Visit (INDEPENDENT_AMBULATORY_CARE_PROVIDER_SITE_OTHER): Payer: Medicare Other

## 2015-01-11 DIAGNOSIS — D62 Acute posthemorrhagic anemia: Secondary | ICD-10-CM | POA: Diagnosis not present

## 2015-01-11 LAB — POCT HEMOGLOBIN: Hemoglobin: 9.7 g/dL — AB (ref 12.2–16.2)

## 2015-01-11 NOTE — Progress Notes (Signed)
Lab only 

## 2015-01-15 ENCOUNTER — Other Ambulatory Visit: Payer: Self-pay | Admitting: Nurse Practitioner

## 2015-01-15 ENCOUNTER — Other Ambulatory Visit (INDEPENDENT_AMBULATORY_CARE_PROVIDER_SITE_OTHER): Payer: Medicare Other

## 2015-01-15 DIAGNOSIS — D649 Anemia, unspecified: Secondary | ICD-10-CM

## 2015-01-15 LAB — POCT HEMOGLOBIN: Hemoglobin: 9.4 g/dL — AB (ref 12.2–16.2)

## 2015-01-15 MED ORDER — HEMOCYTE PLUS 106-1 MG PO CAPS: 1.0000 | ORAL_CAPSULE | Freq: Every day | ORAL | Status: DC

## 2015-01-15 NOTE — Progress Notes (Signed)
Lab only 

## 2015-01-18 ENCOUNTER — Other Ambulatory Visit (INDEPENDENT_AMBULATORY_CARE_PROVIDER_SITE_OTHER): Payer: Medicare Other

## 2015-01-18 DIAGNOSIS — D649 Anemia, unspecified: Secondary | ICD-10-CM

## 2015-01-18 LAB — POCT HEMOGLOBIN: Hemoglobin: 10 g/dL — AB (ref 12.2–16.2)

## 2015-01-18 NOTE — Progress Notes (Signed)
LAB ONLY 

## 2015-01-21 ENCOUNTER — Other Ambulatory Visit (INDEPENDENT_AMBULATORY_CARE_PROVIDER_SITE_OTHER): Payer: Medicare Other

## 2015-01-21 DIAGNOSIS — D649 Anemia, unspecified: Secondary | ICD-10-CM | POA: Diagnosis not present

## 2015-01-21 LAB — POCT HEMOGLOBIN: Hemoglobin: 11.1 g/dL — AB (ref 12.2–16.2)

## 2015-01-21 NOTE — Progress Notes (Signed)
Lab only 

## 2015-02-04 ENCOUNTER — Ambulatory Visit (INDEPENDENT_AMBULATORY_CARE_PROVIDER_SITE_OTHER): Payer: Medicare Other | Admitting: Family Medicine

## 2015-02-04 ENCOUNTER — Encounter: Payer: Self-pay | Admitting: Family Medicine

## 2015-02-04 VITALS — BP 137/87 | HR 78 | Temp 98.1°F | Ht 65.0 in | Wt 140.0 lb

## 2015-02-04 DIAGNOSIS — E559 Vitamin D deficiency, unspecified: Secondary | ICD-10-CM | POA: Diagnosis not present

## 2015-02-04 DIAGNOSIS — I340 Nonrheumatic mitral (valve) insufficiency: Secondary | ICD-10-CM | POA: Diagnosis not present

## 2015-02-04 DIAGNOSIS — Z950 Presence of cardiac pacemaker: Secondary | ICD-10-CM

## 2015-02-04 DIAGNOSIS — E039 Hypothyroidism, unspecified: Secondary | ICD-10-CM | POA: Diagnosis not present

## 2015-02-04 DIAGNOSIS — M25559 Pain in unspecified hip: Secondary | ICD-10-CM

## 2015-02-04 DIAGNOSIS — Z0000 Encounter for general adult medical examination without abnormal findings: Secondary | ICD-10-CM | POA: Diagnosis not present

## 2015-02-04 LAB — POCT CBC
Granulocyte percent: 69.1 %G (ref 37–80)
HEMATOCRIT: 36.1 % — AB (ref 37.7–47.9)
HEMOGLOBIN: 11.6 g/dL — AB (ref 12.2–16.2)
Lymph, poc: 1.4 (ref 0.6–3.4)
MCH: 31.8 pg — AB (ref 27–31.2)
MCHC: 32.1 g/dL (ref 31.8–35.4)
MCV: 99.3 fL — AB (ref 80–97)
MPV: 7.6 fL (ref 0–99.8)
POC Granulocyte: 4 (ref 2–6.9)
POC LYMPH PERCENT: 24 %L (ref 10–50)
Platelet Count, POC: 282 10*3/uL (ref 142–424)
RBC: 3.63 M/uL — AB (ref 4.04–5.48)
RDW, POC: 16.6 %
WBC: 5.8 10*3/uL (ref 4.6–10.2)

## 2015-02-04 NOTE — Progress Notes (Signed)
Subjective:    Patient ID: [name], female    DOB: [birth year], 81 y.o.   MRN: [id number]  HPI Pt here for follow up and management of chronic medical problems which includes hypothyroid and hypertension. She is taking medications regularly. She is followed regularly by the gastroenterologist and the cardiologist. She recently went to the hospital emergency room because of a bleeding varicosity. The patient does have mitral insufficiency. The vascular surgeon recommended a transesophageal echo and at that visit she refuses to do that but she is going back to see him in May and will most likely get that additional tests done at that time. She is still followed by the cardiologist however. Her plans to see the gastroenterologist will be this December. She does take iron and her stools are dark from that. She denies any chest pain or shortness of breath or trouble passing her water. She and her friend seem to have a very good relationship and the friend is very supportive of the patient.       Patient Active Problem List   Diagnosis Date Noted  . Varicose veins of leg with complications   . Bleeding from varicose vein 01/05/2015  . Acute blood loss anemia 01/05/2015  . Stokes-Adams syncope   . Complete heart block   . Aortic insufficiency   . Hypertension   . Mitral regurgitation 12/08/2014  . Palpitations 11/11/2014  . SOB (shortness of breath) 11/11/2014  . Cardiac pacemaker in situ 07/30/2014  . Adenomatous polyp of rectum 07/30/2014  . Osteopenia of the elderly 03/25/2014  . Systolic ejection murmur 49/17/9150  . History of pacemaker 09/04/2013  . Near syncope 08/29/2013  . Fall at home, with facial injury 08/29/2013  . UTI (lower urinary tract infection) 08/29/2013  . Sigmoid diverticulitis 04/23/2013  . Hypothyroidism 04/23/2013  . Chronic back pain 04/23/2013  . Villous adenoma of rectum 01/16/2013  . Mass of left lobe of liver 01/16/2013   Outpatient Encounter  Prescriptions as of 02/04/2015  Medication Sig  . acetaminophen (TYLENOL) 500 MG tablet Take 500 mg by mouth every 6 (six) hours as needed (pain).   . Fe Fum-FA-B Cmp-C-Zn-Mg-Mn-Cu (HEMOCYTE PLUS) 106-1 MG CAPS Take 1 tablet by mouth daily.  Marland Kitchen lisinopril (ZESTRIL) 2.5 MG tablet Take 1 tablet (2.5 mg total) by mouth daily.  . Multiple Vitamin (MULTIVITAMIN WITH MINERALS) TABS Take 1 tablet by mouth daily.  Marland Kitchen SYNTHROID 112 MCG tablet TAKE 1 TABLET DAILY  . furosemide (LASIX) 20 MG tablet Take 1 tablet (20 mg total) by mouth daily. (Patient not taking: Reported on 02/04/2015)    Review of Systems  Constitutional: Negative.   HENT: Negative.   Eyes: Negative.   Respiratory: Negative.   Cardiovascular: Negative.   Gastrointestinal: Negative.   Endocrine: Negative.   Genitourinary: Negative.   Musculoskeletal: Positive for arthralgias (bilateral hip  pain ).  Skin: Negative.   Allergic/Immunologic: Negative.   Neurological: Negative.   Hematological: Negative.        Recent blood loss - of foot  Psychiatric/Behavioral: Negative.        Objective:   Physical Exam  Constitutional: She is oriented to person, place, and time. She appears well-developed and well-nourished.  The patient is alert and in good spirits today. She is slightly pale in appearance.  HENT:  Head: Normocephalic.  Right Ear: External ear normal.  Left Ear: External ear normal.  Nose: Nose normal.  Mouth/Throat: Oropharynx is clear and moist.  Eyes: Conjunctivae and EOM  are normal. Pupils are equal, round, and reactive to light. Right eye exhibits no discharge. Left eye exhibits no discharge. No scleral icterus.  Neck: Normal range of motion. Neck supple. No JVD present. No thyromegaly present.  The mitral insufficiency murmur radiates to both carotids. There is no anterior cervical adenopathy or thyromegaly.  Cardiovascular: Normal rate, regular rhythm and intact distal pulses.  Exam reveals no gallop and no friction  rub.   Murmur heard. The rhythm is regular at 84/m with a grade 3/6 mitral insufficiency murmur  Pulmonary/Chest: Effort normal and breath sounds normal. No respiratory distress. She has no wheezes. She has no rales. She exhibits no tenderness.  Abdominal: Soft. Bowel sounds are normal. She exhibits no mass. There is no tenderness. There is no rebound and no guarding.  Musculoskeletal: Normal range of motion. She exhibits no edema or tenderness.  Lymphadenopathy:    She has no cervical adenopathy.  Neurological: She is alert and oriented to person, place, and time. She has normal reflexes. No cranial nerve deficit.  Skin: Skin is warm and dry. No rash noted.  Psychiatric: She has a normal mood and affect. Her behavior is normal. Judgment and thought content normal.  Nursing note and vitals reviewed.  BP 143/69 mmHg  Pulse 78  Temp(Src) 98.1 F (36.7 C) (Oral)  Ht _0  (1.651 m)  Wt 140 lb (63.504 kg)  BMI 23.30 kg/m2        Assessment & Plan:  1. Hypothyroidism, unspecified hypothyroidism type -Continue current treatment pending results of lab work - POCT CBC - Thyroid Panel With TSH  2. Cardiac pacemaker in situ -Continue regular follow-up with cardiology - POCT CBC - BMP8+EGFR - Lipid panel - Hepatic function panel  3. Health care maintenance -Return the FOBT - POCT CBC - BMP8+EGFR - Lipid panel - Hepatic function panel - Vit D  25 hydroxy (rtn osteoporosis monitoring)  4. Hip pain, unspecified laterality -Take Tylenol for the hip pain  5. Mitral valve insufficiency -Follow-up with cardiology and vascular surgery  Patient Instructions                       Medicare Annual Wellness Visit  [facility] and the medical providers at [facility] strive to bring you the best medical care.  In doing so we not only want to address your current medical conditions and concerns but also to detect new conditions early and prevent illness, disease  and health-related problems.    Medicare offers a yearly Wellness Visit which allows our clinical staff to assess your need for preventative services including immunizations, lifestyle education, counseling to decrease risk of preventable diseases and screening for fall risk and other medical concerns.    This visit is provided free of charge (no copay) for all Medicare recipients. The clinical pharmacists at [facility] have begun to conduct these Wellness Visits which will also include a thorough review of all your medications.    As you primary medical provider recommend that you make an appointment for your Annual Wellness Visit if you have not done so already this year.  You may set up this appointment before you leave today or you may call back ([phone]) and schedule an appointment.  Please make sure when you call that you mention that you are scheduling your Annual Wellness Visit with the clinical pharmacist so that the appointment may be made for the proper length of time.  Continue current medications. Continue good therapeutic lifestyle changes which include good diet and exercise. Fall precautions discussed with patient. If an FOBT was given today- please return it to our front desk. If you are over 72 years old - you may need Prevnar 45 or the adult Pneumonia vaccine.  Flu Shots are still available at our office. If you still haven't had one please call to set up a nurse visit to get one.   After your visit with [place] today you will receive a survey in the mail or online from [employer] regarding your care with [place]. Please take a moment to fill this out. Your feedback is very important to [place] as you can help [place] better understand your patient needs as well as improve your experience and satisfaction. WE CARE ABOUT YOU!!!  Follow-up with cardiology and gastroenterology as planned Be sure and get the transesophageal echo as the surgeon has requested Continue  current medications until lab work is returned and we will make any adjustments at that time Always drink plenty of fluids and be careful to not put yourself at risk for falling Take Tylenol as needed for any hip or pelvic pain.   Arrie Senate MD

## 2015-02-04 NOTE — Patient Instructions (Addendum)
Medicare Annual Wellness Visit  [facility] and the medical providers at [facility] strive to bring you the best medical care.  In doing so we not only want to address your current medical conditions and concerns but also to detect new conditions early and prevent illness, disease and health-related problems.    Medicare offers a yearly Wellness Visit which allows our clinical staff to assess your need for preventative services including immunizations, lifestyle education, counseling to decrease risk of preventable diseases and screening for fall risk and other medical concerns.    This visit is provided free of charge (no copay) for all Medicare recipients. The clinical pharmacists at [facility] have begun to conduct these Wellness Visits which will also include a thorough review of all your medications.    As you primary medical provider recommend that you make an appointment for your Annual Wellness Visit if you have not done so already this year.  You may set up this appointment before you leave today or you may call back ([phone]) and schedule an appointment.  Please make sure when you call that you mention that you are scheduling your Annual Wellness Visit with the clinical pharmacist so that the appointment may be made for the proper length of time.     Continue current medications. Continue good therapeutic lifestyle changes which include good diet and exercise. Fall precautions discussed with patient. If an FOBT was given today- please return it to our front desk. If you are over 79 years old - you may need Prevnar 75 or the adult Pneumonia vaccine.  Flu Shots are still available at our office. If you still haven't had one please call to set up a nurse visit to get one.   After your visit with [place] today you will receive a survey in the mail or online from [employer] regarding your care with [place]. Please take a moment to  fill this out. Your feedback is very important to [place] as you can help [place] better understand your patient needs as well as improve your experience and satisfaction. WE CARE ABOUT YOU!!!  Follow-up with cardiology and gastroenterology as planned Be sure and get the transesophageal echo as the surgeon has requested Continue current medications until lab work is returned and we will make any adjustments at that time Always drink plenty of fluids and be careful to not put yourself at risk for falling Take Tylenol as needed for any hip or pelvic pain.

## 2015-02-05 ENCOUNTER — Other Ambulatory Visit: Payer: Medicare Other

## 2015-02-05 DIAGNOSIS — Z1212 Encounter for screening for malignant neoplasm of rectum: Secondary | ICD-10-CM

## 2015-02-05 LAB — HEPATIC FUNCTION PANEL
ALT: 13 IU/L (ref 0–32)
AST: 23 IU/L (ref 0–40)
Albumin: 4.3 g/dL (ref 3.5–4.7)
Alkaline Phosphatase: 64 IU/L (ref 39–117)
BILIRUBIN TOTAL: 0.4 mg/dL (ref 0.0–1.2)
Bilirubin, Direct: 0.12 mg/dL (ref 0.00–0.40)
Total Protein: 6.9 g/dL (ref 6.0–8.5)

## 2015-02-05 LAB — BMP8+EGFR
BUN / CREAT RATIO: 15 (ref 11–26)
BUN: 15 mg/dL (ref 8–27)
CHLORIDE: 105 mmol/L (ref 97–108)
CO2: 25 mmol/L (ref 18–29)
Calcium: 9.7 mg/dL (ref 8.7–10.3)
Creatinine, Ser: 1.02 mg/dL — ABNORMAL HIGH (ref 0.57–1.00)
GFR calc Af Amer: 58 mL/min/{1.73_m2} — ABNORMAL LOW (ref >59)
GFR calc non Af Amer: 51 mL/min/{1.73_m2} — ABNORMAL LOW (ref >59)
GLUCOSE: 103 mg/dL — AB (ref 65–99)
POTASSIUM: 4.6 mmol/L (ref 3.5–5.2)
Sodium: 142 mmol/L (ref 134–144)

## 2015-02-05 LAB — LIPID PANEL
CHOL/HDL RATIO: 2.9 ratio (ref 0.0–4.4)
Cholesterol, Total: 212 mg/dL — ABNORMAL HIGH (ref 100–199)
HDL: 74 mg/dL (ref >39)
LDL Calculated: 113 mg/dL — ABNORMAL HIGH (ref 0–99)
TRIGLYCERIDES: 125 mg/dL (ref 0–149)
VLDL Cholesterol Cal: 25 mg/dL (ref 5–40)

## 2015-02-05 LAB — VITAMIN D 25 HYDROXY (VIT D DEFICIENCY, FRACTURES): VIT D 25 HYDROXY: 65 ng/mL (ref 30.0–100.0)

## 2015-02-05 LAB — THYROID PANEL WITH TSH
Free Thyroxine Index: 3.2 (ref 1.2–4.9)
T3 UPTAKE RATIO: 29 % (ref 24–39)
T4, Total: 11.1 ug/dL (ref 4.5–12.0)
TSH: 0.912 u[IU]/mL (ref 0.450–4.500)

## 2015-02-07 LAB — FECAL OCCULT BLOOD, IMMUNOCHEMICAL: Fecal Occult Bld: NEGATIVE

## 2015-02-24 ENCOUNTER — Telehealth: Payer: Self-pay | Admitting: Family Medicine

## 2015-02-25 NOTE — Telephone Encounter (Signed)
appt can be offered for Friday with DWM ____ NA at pt # - [id number].jhb

## 2015-03-09 ENCOUNTER — Ambulatory Visit (INDEPENDENT_AMBULATORY_CARE_PROVIDER_SITE_OTHER): Payer: Medicare Other | Admitting: *Deleted

## 2015-03-09 DIAGNOSIS — I442 Atrioventricular block, complete: Secondary | ICD-10-CM | POA: Diagnosis not present

## 2015-03-09 NOTE — Progress Notes (Signed)
Remote pacemaker transmission.   

## 2015-03-11 LAB — CUP PACEART REMOTE DEVICE CHECK
Battery Impedance: 112 Ohm
Brady Statistic AP VP Percent: 6 %
Brady Statistic AP VS Percent: 1 %
Brady Statistic AS VP Percent: 1 %
Brady Statistic AS VS Percent: 92 %
Lead Channel Impedance Value: 504 Ohm
Lead Channel Pacing Threshold Amplitude: 0.5 V
Lead Channel Pacing Threshold Pulse Width: 0.4 ms
Lead Channel Sensing Intrinsic Amplitude: 2.8 mV
Lead Channel Sensing Intrinsic Amplitude: 5.6 mV
Lead Channel Setting Pacing Amplitude: 2.5 V
Lead Channel Setting Sensing Sensitivity: 2 mV
MDC IDC MSMT BATTERY REMAINING LONGEVITY: 156 mo
MDC IDC MSMT BATTERY VOLTAGE: 2.79 V
MDC IDC MSMT LEADCHNL RA IMPEDANCE VALUE: 402 Ohm
MDC IDC MSMT LEADCHNL RV PACING THRESHOLD AMPLITUDE: 0.625 V
MDC IDC MSMT LEADCHNL RV PACING THRESHOLD PULSEWIDTH: 0.4 ms
MDC IDC SESS DTM: 20160510150957
MDC IDC SET LEADCHNL RA PACING AMPLITUDE: 2 V
MDC IDC SET LEADCHNL RV PACING PULSEWIDTH: 0.4 ms

## 2015-03-15 ENCOUNTER — Ambulatory Visit (INDEPENDENT_AMBULATORY_CARE_PROVIDER_SITE_OTHER): Payer: Medicare Other | Admitting: Thoracic Surgery (Cardiothoracic Vascular Surgery)

## 2015-03-15 ENCOUNTER — Encounter: Payer: Self-pay | Admitting: Thoracic Surgery (Cardiothoracic Vascular Surgery)

## 2015-03-15 VITALS — BP 167/70 | HR 78 | Resp 16 | Ht 65.0 in | Wt 140.5 lb

## 2015-03-15 DIAGNOSIS — I340 Nonrheumatic mitral (valve) insufficiency: Secondary | ICD-10-CM | POA: Diagnosis not present

## 2015-03-15 DIAGNOSIS — I351 Nonrheumatic aortic (valve) insufficiency: Secondary | ICD-10-CM | POA: Diagnosis not present

## 2015-03-15 NOTE — Patient Instructions (Signed)
Take your lisinopril and furosemide as prescribed  Check your weight every morning and keep a log.  Report any significant changes in your weight or the development of shortness of breath or swelling to your cardiologist (Dr. Lovena Le)

## 2015-03-15 NOTE — Progress Notes (Signed)
[address]       [place] [address]             [phone]     CARDIOTHORACIC SURGERY OFFICE NOTE  Referring Provider is [name], MD PCP is [name], MD   HPI:  Patient returns to the office today for follow-up of stage D severe symptomatic primary mitral regurgitation with at least moderate aortic insufficiency. She was originally seen in consultation on 12/10/2014. At that time the patient remained very reluctant to consider any further diagnostic testing or possible surgical intervention for treatment of her valvular heart disease. She claimed to be minimally symptomatic with very little if any symptoms of exertional shortness of breath. She had not seen a dentist in several years, and she was referred to Dr. Lawana Chambers. She declined to go through with any definitive management for her ongoing dental issues at that time.  She returns to our office for follow-up today. She is accompanied by her close friend, as she was at the time of her original consultation last February. The patient continues to deny any significant symptoms of exertional shortness of breath. She states that she gets tired with activity, but overall this has not limited her much physically. She denies shortness of breath with ordinary activities around the house. This has not gotten any worse. She has not had any lower extremity edema. She denies any history of resting shortness of breath, PND, orthopnea, palpitations, dizzy spells, or syncope. She has never had any chest pain or chest tightness. She stopped taking Lasix on her own and she has asked about stopping her blood pressure medication.   Current Outpatient Prescriptions  Medication Sig Dispense Refill  . acetaminophen (TYLENOL) 500 MG tablet Take 500 mg by mouth every 6 (six) hours as needed (pain).     . Fe Fum-FA-B Cmp-C-Zn-Mg-Mn-Cu (HEMOCYTE PLUS) 106-1 MG CAPS Take 1 tablet by mouth daily. 30 each 5  . furosemide (LASIX)  20 MG tablet Take 1 tablet (20 mg total) by mouth daily. (Patient not taking: Reported on 03/15/2015) 90 tablet 2  . lisinopril (ZESTRIL) 2.5 MG tablet Take 1 tablet (2.5 mg total) by mouth daily. 90 tablet 2  . Multiple Vitamin (MULTIVITAMIN WITH MINERALS) TABS Take 1 tablet by mouth daily.    Marland Kitchen SYNTHROID 112 MCG tablet TAKE 1 TABLET DAILY 90 tablet 3   No current facility-administered medications for this visit.      Physical Exam:   BP 167/70 mmHg  Pulse 78  Resp 16  Ht 5\' 5"  (1.651 m)  Wt 140 lb 8 oz (63.73 kg)  BMI 23.38 kg/m2  SpO2 98%  General:  Elderly but well-appearing  Chest:   Clear to auscultation  CV:   Regular rate and rhythm with holosystolic murmur and early diastolic murmur  Incisions:  n/a  Abdomen:  Soft and nontender  Extremities:  Warm and well perfused, no lower extremity edema  Diagnostic Tests:  n/a   Impression:  Patient has stage D severe symptomatic primary mitral regurgitation and at least moderate aortic insufficiency. She describes stable symptoms of mild exertional shortness of breath and fatigue consistent with chronic diastolic congestive heart failure, New York Heart Association functional class II. At the time of her most recent transthoracic echocardiogram performed last January her left ventricular size and systolic function remained normal with ejection fraction estimated 65%.  The patient remains adamant that she is not interested in proceeding with any further diagnostic  testing or surgery a time in the near future. She states that she feels fine and her symptoms seem to be very mild and stable.  Under the circumstances, continued medical therapy with close observation seems most appropriate. However, there is certainly a possibility that the patient's functional status could decline somewhat precipitously in the coming months to years.    Plan:  I have again reviewed the implications of the patient's underlying diagnosis with severe  mitral regurgitation and at least moderate aortic insufficiency with the patient and her friend in the office today. The natural history of these problems have been discussed.  The patient has been encouraged to continue to follow up closely with her cardiologist and primary care physician. I would recommend follow-up visits at least every 6 months for the time being.  The patient has been advised that she could should continue to take her lisinopril and Lasix as previously prescribed by Dr. Lovena Le. I suggested that she should check her weight periodically and be quick to report any significant development of symptoms of exertional shortness of breath or lower extremity edema. All of her questions been addressed. We will invite her to return for follow up again in 1 year if desired.    I spent in excess of 15 minutes during the conduct of this office consultation and >50% of this time involved direct face-to-face encounter with the patient for counseling and/or coordination of their care.   Valentina Gu. Roxy Manns, MD 03/15/2015 4:30 PM

## 2015-03-19 ENCOUNTER — Encounter: Payer: Self-pay | Admitting: Cardiology

## 2015-03-23 ENCOUNTER — Encounter: Payer: Self-pay | Admitting: Internal Medicine

## 2015-05-13 ENCOUNTER — Ambulatory Visit (INDEPENDENT_AMBULATORY_CARE_PROVIDER_SITE_OTHER): Payer: Medicare Other | Admitting: Physician Assistant

## 2015-05-13 ENCOUNTER — Telehealth: Payer: Self-pay | Admitting: Family Medicine

## 2015-05-13 ENCOUNTER — Encounter: Payer: Self-pay | Admitting: Physician Assistant

## 2015-05-13 VITALS — BP 148/75 | HR 100 | Temp 98.9°F | Ht 65.0 in | Wt 138.6 lb

## 2015-05-13 DIAGNOSIS — H8110 Benign paroxysmal vertigo, unspecified ear: Secondary | ICD-10-CM | POA: Diagnosis not present

## 2015-05-13 LAB — POCT CBC
Granulocyte percent: 69.3 %G (ref 37–80)
HCT, POC: 41.3 % (ref 37.7–47.9)
Hemoglobin: 13.7 g/dL (ref 12.2–16.2)
Lymph, poc: 1.6 (ref 0.6–3.4)
MCH, POC: 30.8 pg (ref 27–31.2)
MCHC: 33.1 g/dL (ref 31.8–35.4)
MCV: 93.1 fL (ref 80–97)
MPV: 7.9 fL (ref 0–99.8)
POC Granulocyte: 5.1 (ref 2–6.9)
POC LYMPH PERCENT: 21.7 %L (ref 10–50)
Platelet Count, POC: 306 10*3/uL (ref 142–424)
RBC: 4.43 M/uL (ref 4.04–5.48)
RDW, POC: 15.2 %
WBC: 7.3 10*3/uL (ref 4.6–10.2)

## 2015-05-13 NOTE — Patient Instructions (Signed)

## 2015-05-13 NOTE — Progress Notes (Signed)
Subjective:     Patient ID: [name], female   DOB: 1928/10/07, 81 y.o.   MRN: [id number]  HPI Pt with some positional vertigo this am Sx have improved Sl nausea with episodes No recent illness  Review of Systems  Constitutional: Negative.   HENT: Negative.   Respiratory: Negative.   Cardiovascular: Negative.   Neurological: Positive for dizziness and light-headedness.       Objective:   Physical Exam  Constitutional: She appears well-developed and well-nourished.  HENT:  Mouth/Throat: Oropharynx is clear and moist. No oropharyngeal exudate.  Neck: Neck supple. No JVD present.  Cardiovascular: Normal rate and regular rhythm.   sys ej murmur  Pulmonary/Chest: Effort normal and breath sounds normal.  Lymphadenopathy:    She has no cervical adenopathy.  Nursing note and vitals reviewed.  Results for orders placed or performed in visit on 05/13/15  POCT CBC  Result Value Ref Range   WBC 7.3 4.6 - 10.2 K/uL   Lymph, poc 1.6 0.6 - 3.4   POC LYMPH PERCENT 21.7 10 - 50 %L   POC Granulocyte 5.1 2 - 6.9   Granulocyte percent 69.3 37 - 80 %G   RBC 4.43 4.04 - 5.48 M/uL   Hemoglobin 13.7 12.2 - 16.2 g/dL   HCT, POC 41.3 37.7 - 47.9 %   MCV 93.1 80 - 97 fL   MCH, POC 30.8 27 - 31.2 pg   MCHC 33.1 31.8 - 35.4 g/dL   RDW, POC 15.2 %   Platelet Count, POC 306.0 142 - 424 K/uL   MPV 7.9 0 - 99.8 fL        Assessment:     Vertigo    Plan:     Pt wants to hold off on any meds at this time Pt to take change in position slow Hydrate well INB pt will call for meds

## 2015-06-08 ENCOUNTER — Ambulatory Visit (INDEPENDENT_AMBULATORY_CARE_PROVIDER_SITE_OTHER): Payer: Medicare Other | Admitting: *Deleted

## 2015-06-08 DIAGNOSIS — I442 Atrioventricular block, complete: Secondary | ICD-10-CM

## 2015-06-08 NOTE — Progress Notes (Signed)
Remote pacemaker transmission.   

## 2015-06-14 LAB — CUP PACEART REMOTE DEVICE CHECK
Battery Impedance: 113 Ohm
Battery Remaining Longevity: 155 mo
Battery Voltage: 2.79 V
Brady Statistic AP VP Percent: 6 %
Brady Statistic AS VP Percent: 3 %
Lead Channel Impedance Value: 402 Ohm
Lead Channel Impedance Value: 503 Ohm
Lead Channel Pacing Threshold Amplitude: 0.5 V
Lead Channel Pacing Threshold Pulse Width: 0.4 ms
Lead Channel Pacing Threshold Pulse Width: 0.4 ms
Lead Channel Sensing Intrinsic Amplitude: 2.8 mV
Lead Channel Setting Pacing Amplitude: 2 V
Lead Channel Setting Pacing Amplitude: 2.5 V
Lead Channel Setting Sensing Sensitivity: 2 mV
MDC IDC MSMT LEADCHNL RV PACING THRESHOLD AMPLITUDE: 0.625 V
MDC IDC MSMT LEADCHNL RV SENSING INTR AMPL: 5.6 mV
MDC IDC SESS DTM: 20160808145919
MDC IDC SET LEADCHNL RV PACING PULSEWIDTH: 0.4 ms
MDC IDC STAT BRADY AP VS PERCENT: 1 %
MDC IDC STAT BRADY AS VS PERCENT: 90 %

## 2015-07-01 ENCOUNTER — Encounter: Payer: Self-pay | Admitting: Cardiology

## 2015-07-02 ENCOUNTER — Other Ambulatory Visit: Payer: Self-pay

## 2015-07-02 MED ORDER — LISINOPRIL 2.5 MG PO TABS: 2.5000 mg | ORAL_TABLET | Freq: Every day | ORAL | Status: DC

## 2015-07-06 ENCOUNTER — Other Ambulatory Visit: Payer: Self-pay

## 2015-07-06 DIAGNOSIS — Z1231 Encounter for screening mammogram for malignant neoplasm of breast: Secondary | ICD-10-CM

## 2015-07-12 ENCOUNTER — Encounter: Payer: Self-pay | Admitting: Internal Medicine

## 2015-07-13 ENCOUNTER — Ambulatory Visit
Admission: RE | Admit: 2015-07-13 | Discharge: 2015-07-13 | Disposition: A | Discharge status: Home / Self Care | Payer: Medicare Other | Source: Ambulatory Visit

## 2015-07-13 DIAGNOSIS — Z1231 Encounter for screening mammogram for malignant neoplasm of breast: Secondary | ICD-10-CM

## 2015-07-21 ENCOUNTER — Encounter: Payer: Self-pay | Admitting: *Deleted

## 2015-07-21 ENCOUNTER — Telehealth: Payer: Self-pay | Admitting: Internal Medicine

## 2015-07-21 NOTE — Telephone Encounter (Signed)
LMOVM w/ direct # to device clinic.  

## 2015-07-21 NOTE — Telephone Encounter (Signed)
error 

## 2015-07-21 NOTE — Telephone Encounter (Signed)
Pt daughter called and stated that pt had a mammogram last week and she believes that the nurse that done her mammogram got to close to her PPM. She also stated that pt is feeling more weak than normal and wants to know if she can send a remote transmission so we can see if anything is going on. Informed pt daughter that this is ok.

## 2015-07-21 NOTE — Telephone Encounter (Signed)
ERROR

## 2015-07-21 NOTE — Telephone Encounter (Signed)
Pt returning your call

## 2015-08-06 ENCOUNTER — Telehealth: Payer: Self-pay | Admitting: *Deleted

## 2015-08-06 NOTE — Telephone Encounter (Signed)
Updated pt extra remote sent post mammogram shows all functions & diagnostics normal. Pt thankful. Pt aware next remote is 09/07/15.

## 2015-08-06 NOTE — Telephone Encounter (Signed)
LMOVM stating all ppm functions & diagnostics normal post mammogram. Her procedure did not interfere with any device function.

## 2015-08-09 ENCOUNTER — Ambulatory Visit (INDEPENDENT_AMBULATORY_CARE_PROVIDER_SITE_OTHER): Payer: Medicare Other | Admitting: Family Medicine

## 2015-08-09 ENCOUNTER — Encounter: Payer: Self-pay | Admitting: Family Medicine

## 2015-08-09 VITALS — BP 133/71 | HR 94 | Temp 97.1°F | Ht 65.0 in | Wt 136.0 lb

## 2015-08-09 DIAGNOSIS — Z950 Presence of cardiac pacemaker: Secondary | ICD-10-CM

## 2015-08-09 DIAGNOSIS — I340 Nonrheumatic mitral (valve) insufficiency: Secondary | ICD-10-CM

## 2015-08-09 DIAGNOSIS — E559 Vitamin D deficiency, unspecified: Secondary | ICD-10-CM | POA: Diagnosis not present

## 2015-08-09 DIAGNOSIS — E039 Hypothyroidism, unspecified: Secondary | ICD-10-CM | POA: Diagnosis not present

## 2015-08-09 DIAGNOSIS — Z0000 Encounter for general adult medical examination without abnormal findings: Secondary | ICD-10-CM

## 2015-08-09 DIAGNOSIS — R011 Cardiac murmur, unspecified: Secondary | ICD-10-CM | POA: Diagnosis not present

## 2015-08-09 NOTE — Progress Notes (Signed)
Subjective:    Patient ID: [name], female    DOB: [birth year], 81 y.o.   MRN: [id number]  HPI Pt here for follow up and management of chronic medical problems which includes hypothyroid. She is taking medications regularly. The patient is doing well today and has no complaints. She is due to get lab work today. She sees the cardiologist and the vascular surgeon regularly. She is also followed by the gastroenterologist. Today she denies chest pain or shortness of breath. She does complain of feeling tired and fatigued. She has had a call family members a couple times for just not feeling well with nothing specific to complain about. She is also had EMS at her home because of just not feeling well. She denies chest pain or shortness of breath. She denies any GI symptoms of nausea vomiting diarrhea or blood in the stool. She does have some occasional incontinence with her bladder but she wears pads for this. The patient was asked again about taking flu shot or Prevnar vaccine and she refuses this.       Patient Active Problem List   Diagnosis Date Noted  . Varicose veins of leg with complications   . Bleeding from varicose vein 01/05/2015  . Acute blood loss anemia 01/05/2015  . Stokes-Adams syncope   . Complete heart block ([place])   . Aortic insufficiency   . Hypertension   . Mitral regurgitation 12/08/2014  . Palpitations 11/11/2014  . SOB (shortness of breath) 11/11/2014  . Cardiac pacemaker in situ 07/30/2014  . Adenomatous polyp of rectum 07/30/2014  . Osteopenia of the elderly 03/25/2014  . Systolic ejection murmur 98/42/1031  . History of pacemaker 09/04/2013  . Near syncope 08/29/2013  . Fall at home, with facial injury 08/29/2013  . UTI (lower urinary tract infection) 08/29/2013  . Sigmoid diverticulitis 04/23/2013  . Hypothyroidism 04/23/2013  . Chronic back pain 04/23/2013  . Villous adenoma of rectum 01/16/2013  . Mass of left lobe of liver 01/16/2013    Outpatient Encounter Prescriptions as of 08/09/2015  Medication Sig  . acetaminophen (TYLENOL) 500 MG tablet Take 500 mg by mouth every 6 (six) hours as needed (pain).   . furosemide (LASIX) 20 MG tablet Take 1 tablet (20 mg total) by mouth daily.  Marland Kitchen lisinopril (ZESTRIL) 2.5 MG tablet Take 1 tablet (2.5 mg total) by mouth daily.  . Multiple Vitamin (MULTIVITAMIN WITH MINERALS) TABS Take 1 tablet by mouth daily.  Marland Kitchen SYNTHROID 112 MCG tablet TAKE 1 TABLET DAILY  . [DISCONTINUED] Fe Fum-FA-B Cmp-C-Zn-Mg-Mn-Cu (HEMOCYTE PLUS) 106-1 MG CAPS Take 1 tablet by mouth daily.   No facility-administered encounter medications on file as of 08/09/2015.      Review of Systems  Constitutional: Negative.   HENT: Negative.   Eyes: Negative.   Respiratory: Negative.   Cardiovascular: Negative.   Gastrointestinal: Negative.   Endocrine: Negative.   Genitourinary: Negative.   Musculoskeletal: Negative.   Skin: Negative.   Allergic/Immunologic: Negative.   Neurological: Negative.   Hematological: Negative.   Psychiatric/Behavioral: Negative.        Objective:   Physical Exam  Constitutional: She is oriented to person, place, and time. She appears well-developed and well-nourished. No distress.  HENT:  Head: Normocephalic and atraumatic.  Right Ear: External ear normal.  Left Ear: External ear normal.  Nose: Nose normal.  Mouth/Throat: Oropharynx is clear and moist.  Eyes: Conjunctivae and EOM are normal. Pupils are equal, round, and reactive to light. Right eye exhibits no  discharge. Left eye exhibits no discharge. No scleral icterus.  Neck: Normal range of motion. Neck supple. No thyromegaly present.  Cardiovascular: Normal rate, regular rhythm and intact distal pulses.  Exam reveals no gallop and no friction rub.   Murmur heard. At 84/m with a grade 3/6 systolic ejection murmur  Pulmonary/Chest: Effort normal and breath sounds normal. No respiratory distress. She has no wheezes. She has  no rales. She exhibits no tenderness.  Abdominal: Soft. Bowel sounds are normal. She exhibits no mass. There is no tenderness. There is no rebound and no guarding.  Musculoskeletal: Normal range of motion. She exhibits no edema.  Lymphadenopathy:    She has no cervical adenopathy.  Neurological: She is alert and oriented to person, place, and time. She has normal reflexes. No cranial nerve deficit.  Skin: Skin is warm and dry. No rash noted.  Psychiatric: She has a normal mood and affect. Her behavior is normal. Judgment and thought content normal.  Nursing note and vitals reviewed.   BP 133/71 mmHg  Pulse 94  Temp(Src) 97.1 F (36.2 C) (Oral)  Ht _0  (1.651 m)  Wt 136 lb (61.689 kg)  BMI 22.63 kg/m2       Assessment & Plan:  1. Hypothyroidism, unspecified hypothyroidism type -Continue current treatment pending results of lab work - CBC with Differential/Platelet - Thyroid Panel With TSH  2. Cardiac pacemaker in situ -Continue follow-up with cardiology - BMP8+EGFR - CBC with Differential/Platelet - Hepatic function panel - Lipid panel  3. Mitral valve insufficiency -Continue follow-up with vascular surgery - BMP8+EGFR - CBC with Differential/Platelet - Hepatic function panel - Lipid panel  4. Health care maintenance -Patient refuses to take a flu shot or Prevnar vaccine - Vit D  25 hydroxy (rtn osteoporosis monitoring)  5. Systolic ejection murmur -Continue follow-up with vascular surgery and cardiology  Patient Instructions                       Medicare Annual Wellness Visit  [facility] and the medical providers at [facility] strive to bring you the best medical care.  In doing so we not only want to address your current medical conditions and concerns but also to detect new conditions early and prevent illness, disease and health-related problems.    Medicare offers a yearly Wellness Visit which allows our clinical staff to assess  your need for preventative services including immunizations, lifestyle education, counseling to decrease risk of preventable diseases and screening for fall risk and other medical concerns.    This visit is provided free of charge (no copay) for all Medicare recipients. The clinical pharmacists at [facility] have begun to conduct these Wellness Visits which will also include a thorough review of all your medications.    As you primary medical provider recommend that you make an appointment for your Annual Wellness Visit if you have not done so already this year.  You may set up this appointment before you leave today or you may call back ([phone]) and schedule an appointment.  Please make sure when you call that you mention that you are scheduling your Annual Wellness Visit with the clinical pharmacist so that the appointment may be made for the proper length of time.     Continue current medications. Continue good therapeutic lifestyle changes which include good diet and exercise. Fall precautions discussed with patient. If an FOBT was given today- please return it to our front desk.  If you are over 80 years old - you may need Prevnar 74 or the adult Pneumonia vaccine.  **Flu shots will be available soon--- please call and schedule a FLU-CLINIC appointment**  After your visit with [place] today you will receive a survey in the mail or online from [employer] regarding your care with [place]. Please take a moment to fill this out. Your feedback is very important to [place] as you can help [place] better understand your patient needs as well as improve your experience and satisfaction. WE CARE ABOUT YOU!!!    The patient should continue to follow-up with cardiology and gastroenterology and vascular surgery as she is doing   Arrie Senate MD

## 2015-08-09 NOTE — Patient Instructions (Addendum)
Medicare Annual Wellness Visit  [facility] and the medical providers at [facility] strive to bring you the best medical care.  In doing so we not only want to address your current medical conditions and concerns but also to detect new conditions early and prevent illness, disease and health-related problems.    Medicare offers a yearly Wellness Visit which allows our clinical staff to assess your need for preventative services including immunizations, lifestyle education, counseling to decrease risk of preventable diseases and screening for fall risk and other medical concerns.    This visit is provided free of charge (no copay) for all Medicare recipients. The clinical pharmacists at [facility] have begun to conduct these Wellness Visits which will also include a thorough review of all your medications.    As you primary medical provider recommend that you make an appointment for your Annual Wellness Visit if you have not done so already this year.  You may set up this appointment before you leave today or you may call back ([phone]) and schedule an appointment.  Please make sure when you call that you mention that you are scheduling your Annual Wellness Visit with the clinical pharmacist so that the appointment may be made for the proper length of time.     Continue current medications. Continue good therapeutic lifestyle changes which include good diet and exercise. Fall precautions discussed with patient. If an FOBT was given today- please return it to our front desk. If you are over 39 years old - you may need Prevnar 66 or the adult Pneumonia vaccine.  **Flu shots will be available soon--- please call and schedule a FLU-CLINIC appointment**  After your visit with [place] today you will receive a survey in the mail or online from [employer] regarding your care with [place]. Please take a moment to fill this out. Your feedback is  very important to [place] as you can help [place] better understand your patient needs as well as improve your experience and satisfaction. WE CARE ABOUT YOU!!!    The patient should continue to follow-up with cardiology and gastroenterology and vascular surgery as she is doing

## 2015-08-10 LAB — CBC WITH DIFFERENTIAL/PLATELET
BASOS ABS: 0.1 10*3/uL (ref 0.0–0.2)
Basos: 1 %
EOS (ABSOLUTE): 0.2 10*3/uL (ref 0.0–0.4)
Eos: 3 %
Hematocrit: 41.3 % (ref 34.0–46.6)
Hemoglobin: 14 g/dL (ref 11.1–15.9)
IMMATURE GRANS (ABS): 0 10*3/uL (ref 0.0–0.1)
IMMATURE GRANULOCYTES: 0 %
Lymphocytes Absolute: 1.5 10*3/uL (ref 0.7–3.1)
Lymphs: 23 %
MCH: 32.6 pg (ref 26.6–33.0)
MCHC: 33.9 g/dL (ref 31.5–35.7)
MCV: 96 fL (ref 79–97)
Monocytes Absolute: 0.6 10*3/uL (ref 0.1–0.9)
Monocytes: 9 %
NEUTROS PCT: 64 %
Neutrophils Absolute: 4.3 10*3/uL (ref 1.4–7.0)
PLATELETS: 327 10*3/uL (ref 150–379)
RBC: 4.3 x10E6/uL (ref 3.77–5.28)
RDW: 14.3 % (ref 12.3–15.4)
WBC: 6.7 10*3/uL (ref 3.4–10.8)

## 2015-08-10 LAB — HEPATIC FUNCTION PANEL
ALT: 10 IU/L (ref 0–32)
AST: 20 IU/L (ref 0–40)
Albumin: 4.1 g/dL (ref 3.5–4.7)
Alkaline Phosphatase: 71 IU/L (ref 39–117)
BILIRUBIN TOTAL: 0.5 mg/dL (ref 0.0–1.2)
BILIRUBIN, DIRECT: 0.14 mg/dL (ref 0.00–0.40)
Total Protein: 6.9 g/dL (ref 6.0–8.5)

## 2015-08-10 LAB — BMP8+EGFR
BUN/Creatinine Ratio: 18 (ref 11–26)
BUN: 17 mg/dL (ref 8–27)
CALCIUM: 10 mg/dL (ref 8.7–10.3)
CHLORIDE: 101 mmol/L (ref 97–108)
CO2: 25 mmol/L (ref 18–29)
Creatinine, Ser: 0.93 mg/dL (ref 0.57–1.00)
GFR calc Af Amer: 65 mL/min/{1.73_m2} (ref >59)
GFR calc non Af Amer: 57 mL/min/{1.73_m2} — ABNORMAL LOW (ref >59)
Glucose: 98 mg/dL (ref 65–99)
POTASSIUM: 4.5 mmol/L (ref 3.5–5.2)
Sodium: 142 mmol/L (ref 134–144)

## 2015-08-10 LAB — LIPID PANEL
CHOLESTEROL TOTAL: 215 mg/dL — AB (ref 100–199)
Chol/HDL Ratio: 3.2 ratio units (ref 0.0–4.4)
HDL: 68 mg/dL (ref >39)
LDL Calculated: 130 mg/dL — ABNORMAL HIGH (ref 0–99)
Triglycerides: 87 mg/dL (ref 0–149)
VLDL CHOLESTEROL CAL: 17 mg/dL (ref 5–40)

## 2015-08-10 LAB — THYROID PANEL WITH TSH
Free Thyroxine Index: 3.3 (ref 1.2–4.9)
T3 UPTAKE RATIO: 31 % (ref 24–39)
T4, Total: 10.7 ug/dL (ref 4.5–12.0)
TSH: 2.9 u[IU]/mL (ref 0.450–4.500)

## 2015-08-10 LAB — VITAMIN D 25 HYDROXY (VIT D DEFICIENCY, FRACTURES): Vit D, 25-Hydroxy: 66.3 ng/mL (ref 30.0–100.0)

## 2015-08-20 ENCOUNTER — Telehealth: Payer: Self-pay | Admitting: Family Medicine

## 2015-08-20 NOTE — Telephone Encounter (Signed)
She does not want a flu shot

## 2015-09-07 ENCOUNTER — Ambulatory Visit (INDEPENDENT_AMBULATORY_CARE_PROVIDER_SITE_OTHER): Payer: Medicare Other | Admitting: *Deleted

## 2015-09-07 DIAGNOSIS — I442 Atrioventricular block, complete: Secondary | ICD-10-CM

## 2015-09-07 NOTE — Progress Notes (Signed)
Remote pacemaker transmission.   

## 2015-09-09 LAB — CUP PACEART REMOTE DEVICE CHECK
Battery Remaining Longevity: 145 mo
Brady Statistic AP VP Percent: 7 %
Brady Statistic AP VS Percent: 1 %
Brady Statistic AS VP Percent: 9 %
Brady Statistic AS VS Percent: 83 %
Date Time Interrogation Session: 20161108133633
Implantable Lead Implant Date: 20141103
Implantable Lead Location: 753859
Implantable Lead Model: 5076
Implantable Lead Model: 5076
Lead Channel Impedance Value: 386 Ohm
Lead Channel Impedance Value: 513 Ohm
Lead Channel Pacing Threshold Amplitude: 0.5 V
Lead Channel Pacing Threshold Amplitude: 0.75 V
Lead Channel Pacing Threshold Pulse Width: 0.4 ms
Lead Channel Pacing Threshold Pulse Width: 0.4 ms
Lead Channel Sensing Intrinsic Amplitude: 5.6 mV
Lead Channel Setting Pacing Pulse Width: 0.4 ms
Lead Channel Setting Sensing Sensitivity: 2 mV
MDC IDC LEAD IMPLANT DT: 20141103
MDC IDC LEAD LOCATION: 753860
MDC IDC MSMT BATTERY IMPEDANCE: 137 Ohm
MDC IDC MSMT BATTERY VOLTAGE: 2.8 V
MDC IDC MSMT LEADCHNL RA SENSING INTR AMPL: 2.8 mV
MDC IDC SET LEADCHNL RA PACING AMPLITUDE: 2 V
MDC IDC SET LEADCHNL RV PACING AMPLITUDE: 2.5 V

## 2015-09-10 ENCOUNTER — Encounter: Payer: Self-pay | Admitting: Cardiology

## 2015-09-18 ENCOUNTER — Other Ambulatory Visit: Payer: Self-pay | Admitting: Internal Medicine

## 2015-10-22 ENCOUNTER — Other Ambulatory Visit: Payer: Self-pay | Admitting: Family Medicine

## 2015-11-05 ENCOUNTER — Telehealth: Payer: Self-pay

## 2015-11-05 DIAGNOSIS — N3001 Acute cystitis with hematuria: Secondary | ICD-10-CM | POA: Diagnosis not present

## 2015-11-05 DIAGNOSIS — R42 Dizziness and giddiness: Secondary | ICD-10-CM | POA: Diagnosis not present

## 2015-11-05 NOTE — Telephone Encounter (Signed)
Nurse from [facility] called to get information on patient. She states that patient presented to them this morning stating that "I just don't feel well." Nurse was wanting list of medication and any history. Gave nurse all info and she states that patient says she is not taking lasix because she "doesn't have any fluid." Nurse just wanted [place] to know she was not taking medications

## 2015-12-08 ENCOUNTER — Encounter: Payer: Self-pay | Admitting: Family Medicine

## 2015-12-08 ENCOUNTER — Ambulatory Visit (INDEPENDENT_AMBULATORY_CARE_PROVIDER_SITE_OTHER): Payer: Medicare Other

## 2015-12-08 ENCOUNTER — Ambulatory Visit (INDEPENDENT_AMBULATORY_CARE_PROVIDER_SITE_OTHER): Payer: Medicare Other | Admitting: Family Medicine

## 2015-12-08 VITALS — BP 120/60 | HR 78 | Temp 97.4°F | Ht 65.0 in | Wt 137.0 lb

## 2015-12-08 DIAGNOSIS — M25551 Pain in right hip: Secondary | ICD-10-CM | POA: Diagnosis not present

## 2015-12-08 DIAGNOSIS — S76011A Strain of muscle, fascia and tendon of right hip, initial encounter: Secondary | ICD-10-CM

## 2015-12-08 NOTE — Progress Notes (Signed)
Subjective:    Patient ID: [name], female    DOB: [birth year], 82 y.o.   MRN: [id number]  HPI Patient here today for right hip pain that started yesterday. She has been working a little more than normal around the house and at [employer]. She is doing a lot of bending and squatting while working at [employer] and in her house 2 days ago and the pain started yesterday. Her friend is with her and she was with her 2 days ago and said that she did not fall. She was just squatting picking up limbs and doing this type of activity..      Patient Active Problem List   Diagnosis Date Noted  . Varicose veins of leg with complications   . Bleeding from varicose vein 01/05/2015  . Acute blood loss anemia 01/05/2015  . Stokes-Adams syncope   . Complete heart block ([place])   . Aortic insufficiency   . Hypertension   . Mitral regurgitation 12/08/2014  . Palpitations 11/11/2014  . SOB (shortness of breath) 11/11/2014  . Cardiac pacemaker in situ 07/30/2014  . Adenomatous polyp of rectum 07/30/2014  . Osteopenia of the elderly 03/25/2014  . Systolic ejection murmur [id number]  . History of pacemaker 09/04/2013  . Near syncope 08/29/2013  . Fall at home, with facial injury 08/29/2013  . UTI (lower urinary tract infection) 08/29/2013  . Sigmoid diverticulitis 04/23/2013  . Hypothyroidism 04/23/2013  . Chronic back pain 04/23/2013  . Villous adenoma of rectum 01/16/2013  . Mass of left lobe of liver 01/16/2013   Outpatient Encounter Prescriptions as of 12/08/2015  Medication Sig  . acetaminophen (TYLENOL) 500 MG tablet Take 500 mg by mouth every 6 (six) hours as needed (pain).   . furosemide (LASIX) 20 MG tablet Take 1 tablet (20 mg total) by mouth daily.  Marland Kitchen lisinopril (PRINIVIL,ZESTRIL) 2.5 MG tablet TAKE 1 TABLET DAILY  . Multiple Vitamin (MULTIVITAMIN WITH MINERALS) TABS Take 1 tablet by mouth daily.  Marland Kitchen SYNTHROID 112 MCG tablet TAKE 1 TABLET DAILY  . [DISCONTINUED] lisinopril  (ZESTRIL) 2.5 MG tablet Take 1 tablet (2.5 mg total) by mouth daily.   No facility-administered encounter medications on file as of 12/08/2015.      Review of Systems  Constitutional: Negative.   HENT: Negative.   Eyes: Negative.   Respiratory: Negative.   Cardiovascular: Negative.   Gastrointestinal: Negative.   Endocrine: Negative.   Genitourinary: Negative.   Musculoskeletal: Positive for arthralgias (right hip pain).  Skin: Negative.   Allergic/Immunologic: Negative.   Neurological: Negative.   Hematological: Negative.   Psychiatric/Behavioral: Negative.        Objective:   Physical Exam  Constitutional: She is oriented to person, place, and time. She appears well-developed and well-nourished. No distress.  HENT:  Head: Normocephalic and atraumatic.  Eyes: Conjunctivae and EOM are normal. Pupils are equal, round, and reactive to light. Right eye exhibits no discharge. Left eye exhibits no discharge. No scleral icterus.  Neck: Normal range of motion.  Pulmonary/Chest: Breath sounds normal.  Musculoskeletal: She exhibits no edema or tenderness.  It is hard to straighten out the right leg and thigh area without having some pain in the right hip and low back. With leg raising on the right side there is some pain with lowering the leg. She has good leg raising on the left. Reflexes appear to be equal bilaterally. There is no sign of any rash. There is actually no point tenderness in the low back  or right lateral hip.  Neurological: She is alert and oriented to person, place, and time. She has normal reflexes. No cranial nerve deficit.  Skin: Skin is warm and dry. No rash noted.  Psychiatric: She has a normal mood and affect. Her behavior is normal. Judgment and thought content normal.  Nursing note and vitals reviewed.  BP 120/60 mmHg  Pulse 78  Temp(Src) 97.4 F (36.3 C) (Oral)  Ht 5\' 5"  (1.651 m)  Wt 137 lb (62.143 kg)  BMI 22.80 kg/m2  [facility] reading (PRIMARY) by  Dr.  Bobbe Medico spine and right hip--                                       Assessment & Plan:  1. Right hip pain -Use ice off and on for the next 24 hours to the right hip and low back and after that period time switched to warm wet compresses 20 minutes 3 or 4 times daily -Take ibuprofen 200 mg 3 times daily after meals for the next 7-10 days and this medicine bothers her stomach and should be discontinued. - DG HIP UNILAT W OR W/O PELVIS 2-3 VIEWS RIGHT; Future - DG Lumbar Spine 2-3 Views; Future  2. Hip strain, right, initial encounter -Follow treatment guidelines as above  Patient Instructions  Use ice cold compresses off and on for the next 24 hours and then after that use warm wet compresses to the right low back and right hip area Take ibuprofen 200 mg 3 times daily after meals for the next 7-10 days--- if this bothers his stomach discontinue it----this should be okay for short period time with her current medications.   Arrie Senate MD

## 2015-12-08 NOTE — Patient Instructions (Addendum)
Use ice cold compresses off and on for the next 24 hours and then after that use warm wet compresses to the right low back and right hip area Take ibuprofen 200 mg 3 times daily after meals for the next 7-10 days--- if this bothers his stomach discontinue it----this should be okay for short period time with her current medications.

## 2015-12-15 ENCOUNTER — Ambulatory Visit (INDEPENDENT_AMBULATORY_CARE_PROVIDER_SITE_OTHER): Payer: Medicare Other | Admitting: Family Medicine

## 2015-12-15 ENCOUNTER — Encounter: Payer: Self-pay | Admitting: Family Medicine

## 2015-12-15 VITALS — BP 135/71 | HR 107 | Temp 98.5°F | Ht 65.0 in | Wt 138.0 lb

## 2015-12-15 DIAGNOSIS — M25551 Pain in right hip: Secondary | ICD-10-CM | POA: Diagnosis not present

## 2015-12-15 NOTE — Patient Instructions (Addendum)
We will arrange an appointment with ortho - in our office Dr Emiliano Dyer. Take IBUPROFEN at night if needed If completely better - can cancel ortho appt.  We will see you back in about 4 months  The patient should continue to use warm wet compresses to the right hip 20 minutes 3 or 4 times daily If the patient is not better in about 4 weeks she will see Dr. Seward Speck.

## 2015-12-15 NOTE — Progress Notes (Signed)
Subjective:    Patient ID: [name], female    DOB: Sep 28, 1928, 82 y.o.   MRN: [id number]  HPI Patient here today for 10 day follow up on right hip pain. She states it is some better. The recent x-rays were reviewed with the patient indicating that she had mild-to-moderate osteoarthritis in the lumbar spine and mild arthritis in the right hip. She also has scoliosis. She says she cannot take ibuprofen because it makes her drowsy and she is only taking it as needed at nighttime. The patient was given copies of the x-ray reports.      Patient Active Problem List   Diagnosis Date Noted  . Varicose veins of leg with complications   . Bleeding from varicose vein 01/05/2015  . Acute blood loss anemia 01/05/2015  . Stokes-Adams syncope   . Complete heart block ([place])   . Aortic insufficiency   . Hypertension   . Mitral regurgitation 12/08/2014  . Palpitations 11/11/2014  . SOB (shortness of breath) 11/11/2014  . Cardiac pacemaker in situ 07/30/2014  . Adenomatous polyp of rectum 07/30/2014  . Osteopenia of the elderly 03/25/2014  . Systolic ejection murmur Q000111Q  . History of pacemaker 09/04/2013  . Near syncope 08/29/2013  . Fall at home, with facial injury 08/29/2013  . UTI (lower urinary tract infection) 08/29/2013  . Sigmoid diverticulitis 04/23/2013  . Hypothyroidism 04/23/2013  . Chronic back pain 04/23/2013  . Villous adenoma of rectum 01/16/2013  . Mass of left lobe of liver 01/16/2013   Outpatient Encounter Prescriptions as of 12/15/2015  Medication Sig  . acetaminophen (TYLENOL) 500 MG tablet Take 500 mg by mouth every 6 (six) hours as needed (pain).   . furosemide (LASIX) 20 MG tablet Take 1 tablet (20 mg total) by mouth daily.  Marland Kitchen lisinopril (PRINIVIL,ZESTRIL) 2.5 MG tablet TAKE 1 TABLET DAILY  . Multiple Vitamin (MULTIVITAMIN WITH MINERALS) TABS Take 1 tablet by mouth daily.  Marland Kitchen SYNTHROID 112 MCG tablet TAKE 1 TABLET DAILY   No facility-administered  encounter medications on file as of 12/15/2015.      Review of Systems  Constitutional: Negative.   HENT: Negative.   Eyes: Negative.   Respiratory: Negative.   Cardiovascular: Negative.   Gastrointestinal: Negative.   Endocrine: Negative.   Genitourinary: Negative.   Musculoskeletal: Positive for arthralgias (right hip ).  Skin: Negative.   Allergic/Immunologic: Negative.   Neurological: Negative.   Hematological: Negative.   Psychiatric/Behavioral: Negative.        Objective:   Physical Exam  Constitutional: She is oriented to person, place, and time. She appears well-developed and well-nourished.  Musculoskeletal: She exhibits tenderness. She exhibits no edema.  The patient has trouble raising the leg up because of pain in the right lateral hip. She is tender in the right lateral slightly posterior hip area.  Neurological: She is alert and oriented to person, place, and time.  Skin: Skin is warm and dry. No rash noted.  Psychiatric: She has a normal mood and affect. Her behavior is normal. Thought content normal.  Vitals reviewed.  BP 135/71 mmHg  Pulse 107  Temp(Src) 98.5 F (36.9 C) (Oral)  Ht 5\' 5"  (1.651 m)  Wt 138 lb (62.596 kg)  BMI 22.96 kg/m2  40 of Depo-Medrol was injected to the right posterior hip area at the point of tenderness after sterile cleansing and a pressure dressing was applied.      Assessment & Plan:  1. Right hip pain -Continue to take ibuprofen  200 mg after a snack at bedtime for the next 7-10 days - Ambulatory referral to Orthopedic Surgery  Patient Instructions  We will arrange an appointment with ortho - in our office Dr Emiliano Dyer. Take IBUPROFEN at night if needed If completely better - can cancel ortho appt.  We will see you back in about 4 months  The patient should continue to use warm wet compresses to the right hip 20 minutes 3 or 4 times daily If the patient is not better in about 4 weeks she will see Dr. Seward Speck.   Arrie Senate MD

## 2015-12-21 ENCOUNTER — Ambulatory Visit (INDEPENDENT_AMBULATORY_CARE_PROVIDER_SITE_OTHER): Payer: Medicare Other | Admitting: Internal Medicine

## 2015-12-21 ENCOUNTER — Encounter: Payer: Self-pay | Admitting: Internal Medicine

## 2015-12-21 VITALS — BP 130/60 | HR 71 | Ht 65.0 in | Wt 135.8 lb

## 2015-12-21 DIAGNOSIS — I459 Conduction disorder, unspecified: Secondary | ICD-10-CM

## 2015-12-21 DIAGNOSIS — I340 Nonrheumatic mitral (valve) insufficiency: Secondary | ICD-10-CM

## 2015-12-21 LAB — CUP PACEART INCLINIC DEVICE CHECK
Battery Impedance: 136 Ohm
Brady Statistic AP VP Percent: 9 %
Brady Statistic AP VS Percent: 1 %
Brady Statistic AS VP Percent: 9 %
Date Time Interrogation Session: 20170221143337
Implantable Lead Location: 753859
Implantable Lead Model: 5076
Lead Channel Pacing Threshold Amplitude: 0.5 V
Lead Channel Pacing Threshold Pulse Width: 0.4 ms
Lead Channel Pacing Threshold Pulse Width: 0.4 ms
Lead Channel Sensing Intrinsic Amplitude: 4 mV
Lead Channel Setting Pacing Amplitude: 2.5 V
MDC IDC LEAD IMPLANT DT: 20141103
MDC IDC LEAD IMPLANT DT: 20141103
MDC IDC LEAD LOCATION: 753860
MDC IDC MSMT BATTERY REMAINING LONGEVITY: 144 mo
MDC IDC MSMT BATTERY VOLTAGE: 2.79 V
MDC IDC MSMT LEADCHNL RA IMPEDANCE VALUE: 367 Ohm
MDC IDC MSMT LEADCHNL RA PACING THRESHOLD AMPLITUDE: 0.5 V
MDC IDC MSMT LEADCHNL RV IMPEDANCE VALUE: 496 Ohm
MDC IDC MSMT LEADCHNL RV PACING THRESHOLD AMPLITUDE: 0.625 V
MDC IDC MSMT LEADCHNL RV PACING THRESHOLD AMPLITUDE: 0.75 V
MDC IDC MSMT LEADCHNL RV PACING THRESHOLD PULSEWIDTH: 0.4 ms
MDC IDC MSMT LEADCHNL RV PACING THRESHOLD PULSEWIDTH: 0.4 ms
MDC IDC MSMT LEADCHNL RV SENSING INTR AMPL: 8 mV
MDC IDC SET LEADCHNL RA PACING AMPLITUDE: 2 V
MDC IDC SET LEADCHNL RV PACING PULSEWIDTH: 0.4 ms
MDC IDC SET LEADCHNL RV SENSING SENSITIVITY: 2 mV
MDC IDC STAT BRADY AS VS PERCENT: 81 %

## 2015-12-21 NOTE — Progress Notes (Signed)
HPI [name] returns today for followup. She is a pleasant 82 yo woman with CHB, s/p PPM placed just over a year ago. In the interim, she had been fairly stable despite her mitral regurgitation.  She has preserved left ventricular systolic function by echo previously. She has not had syncope, and has class II symptoms. No Known Allergies   Current Outpatient Prescriptions  Medication Sig Dispense Refill  . acetaminophen (TYLENOL) 500 MG tablet Take 500 mg by mouth every 6 (six) hours as needed (pain).     Marland Kitchen levothyroxine (SYNTHROID, LEVOTHROID) 112 MCG tablet Take 112 mcg by mouth daily before breakfast.    . lisinopril (PRINIVIL,ZESTRIL) 2.5 MG tablet Take 2.5 mg by mouth daily.    . Multiple Vitamin (MULTIVITAMIN WITH MINERALS) TABS Take 1 tablet by mouth daily.     No current facility-administered medications for this visit.     Past Medical History  Diagnosis Date  . Back pain   . UTI (urinary tract infection)   . Hypothyroid   . Diverticulitis   . Liver lesion   . Rectal polyp   . Lung nodule   . Mitral regurgitation 12/08/2014  . Sigmoid diverticulitis 04/23/2013    History of March 2014   . Villous adenoma of rectum 01/16/2013  . 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 Complete heart block ([place]) 08/31/2013  . Aortic insufficiency   . Hypertension     ROS:   All systems reviewed and negative except as noted in the HPI.   Past Surgical History  Procedure Laterality Date  . Abdominal hysterectomy      still has ovaries  . Flexible sigmoidoscopy N/A 01/17/2013    Procedure: FLEXIBLE SIGMOIDOSCOPY;  Surgeon: [name], MD;  Location: [facility];  Service: Gastroenterology;  Laterality: N/A;  . Pacemaker placement  09/01/2013    Medtronic  . Inner ear surgery  1970's    due to infection  . Permanent pacemaker insertion N/A 09/01/2013    Procedure: PERMANENT PACEMAKER INSERTION;  Surgeon: [name], MD;  Location: [facility] CATH LAB;   Service: Cardiovascular;  Laterality: N/A;     Family History  Problem Relation Age of Onset  . Breast cancer Sister   . Cancer Sister     breast  . Diabetes Sister   . Heart disease Sister   . Heart disease Mother   . Colon cancer Neg Hx   . Heart disease Father      Social History   Social History  . Marital Status: Widowed    Spouse Name: N/A  . Number of Children: 1  . Years of Education: N/A   Occupational History  . retired    Social History Main Topics  . Smoking status: Never Smoker   . Smokeless tobacco: Never Used  . Alcohol Use: No  . Drug Use: No  . Sexual Activity: Not on file   Other Topics Concern  . Not on file   Social History Narrative     BP 130/60 mmHg  Pulse 71  Ht 5\' 5"  (1.651 m)  Wt 135 lb 12.8 oz (61.598 kg)  BMI 22.60 kg/m2  SpO2 98%  Physical Exam:  Well appearing elderly woman, NAD HEENT: Unremarkable Neck:  No JVD, no thyromegally Back:  No CVA tenderness Lungs:  Clear with no wheezes, well-healed pacemaker incision. HEART:  Regular rate rhythm, 2/6 systolic murmurs, no rubs, no clicks Abd:  soft, positive bowel sounds,  no organomegally, no rebound, no guarding Ext:  2 plus pulses, no edema, no cyanosis, no clubbing Skin:  No rashes no nodules Neuro:  CN II through XII intact, motor grossly intact   DEVICE  Normal device function.  See PaceArt for details.   Assess/Plan: 1. 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 2. Medtronic DDD PM - her device has been interogated today and is working normally. Will follow. 3. Mitral regurgitation - she has severe MR by echo in the setting of normal LV function and minimal symptoms. At her advanced age and lack of symptoms, I would not consider additional evaluation at this time unless she becomes symptomatic.  4. HTN - her blood pressure is well controlled on low dose lisinopril.  Mikle Bosworth.D.

## 2015-12-21 NOTE — Patient Instructions (Signed)
Medication Instructions:  Your physician recommends that you continue on your current medications as directed. Please refer to the Current Medication list given to you today.   Labwork: None ordered   Testing/Procedures: None ordered   Follow-Up: Your physician wants you to follow-up in: 12 months with Dr Knox Saliva will receive a reminder letter in the mail two months in advance. If you don't receive a letter, please call our office to schedule the follow-up appointment.  Remote monitoring is used to monitor your Pacemaker  from home. This monitoring reduces the number of office visits required to check your device to one time per year. It allows [place] to keep an eye on the functioning of your device to ensure it is working properly. You are scheduled for a device check from home on 03/21/16. You may send your transmission at any time that day. If you have a wireless device, the transmission will be sent automatically. After your physician reviews your transmission, you will receive a postcard with your next transmission date.      Any Other Special Instructions Will Be Listed Below (If Applicable).     If you need a refill on your cardiac medications before your next appointment, please call your pharmacy.

## 2016-01-01 ENCOUNTER — Emergency Department (HOSPITAL_COMMUNITY)
Admission: EM | Admit: 2016-01-01 | Discharge: 2016-01-01 | Disposition: A | Discharge status: Home / Self Care | Payer: Medicare Other | Attending: Emergency Medicine | Admitting: Emergency Medicine

## 2016-01-01 DIAGNOSIS — R404 Transient alteration of awareness: Secondary | ICD-10-CM | POA: Diagnosis not present

## 2016-01-01 DIAGNOSIS — Z950 Presence of cardiac pacemaker: Secondary | ICD-10-CM | POA: Insufficient documentation

## 2016-01-01 DIAGNOSIS — Z79899 Other long term (current) drug therapy: Secondary | ICD-10-CM | POA: Diagnosis not present

## 2016-01-01 DIAGNOSIS — Z85048 Personal history of other malignant neoplasm of rectum, rectosigmoid junction, and anus: Secondary | ICD-10-CM | POA: Insufficient documentation

## 2016-01-01 DIAGNOSIS — I10 Essential (primary) hypertension: Secondary | ICD-10-CM | POA: Diagnosis not present

## 2016-01-01 DIAGNOSIS — Z86010 Personal history of colonic polyps: Secondary | ICD-10-CM | POA: Diagnosis not present

## 2016-01-01 DIAGNOSIS — Z90710 Acquired absence of both cervix and uterus: Secondary | ICD-10-CM | POA: Diagnosis not present

## 2016-01-01 DIAGNOSIS — N390 Urinary tract infection, site not specified: Secondary | ICD-10-CM | POA: Diagnosis not present

## 2016-01-01 DIAGNOSIS — Z8719 Personal history of other diseases of the digestive system: Secondary | ICD-10-CM | POA: Diagnosis not present

## 2016-01-01 DIAGNOSIS — E039 Hypothyroidism, unspecified: Secondary | ICD-10-CM | POA: Insufficient documentation

## 2016-01-01 DIAGNOSIS — R42 Dizziness and giddiness: Secondary | ICD-10-CM

## 2016-01-01 DIAGNOSIS — R531 Weakness: Secondary | ICD-10-CM | POA: Diagnosis not present

## 2016-01-01 LAB — CBC WITH DIFFERENTIAL/PLATELET
Basophils Absolute: 0.1 10*3/uL (ref 0.0–0.1)
Basophils Relative: 1 %
EOS PCT: 1 %
Eosinophils Absolute: 0.1 10*3/uL (ref 0.0–0.7)
HCT: 41.3 % (ref 36.0–46.0)
Hemoglobin: 13.3 g/dL (ref 12.0–15.0)
LYMPHS ABS: 0.8 10*3/uL (ref 0.7–4.0)
LYMPHS PCT: 9 %
MCH: 31.6 pg (ref 26.0–34.0)
MCHC: 32.2 g/dL (ref 30.0–36.0)
MCV: 98.1 fL (ref 78.0–100.0)
MONO ABS: 0.5 10*3/uL (ref 0.1–1.0)
Monocytes Relative: 5 %
Neutro Abs: 7.3 10*3/uL (ref 1.7–7.7)
Neutrophils Relative %: 84 %
PLATELETS: 263 10*3/uL (ref 150–400)
RBC: 4.21 MIL/uL (ref 3.87–5.11)
RDW: 15.3 % (ref 11.5–15.5)
WBC: 8.7 10*3/uL (ref 4.0–10.5)

## 2016-01-01 LAB — URINALYSIS, ROUTINE W REFLEX MICROSCOPIC
BILIRUBIN URINE: NEGATIVE
GLUCOSE, UA: NEGATIVE mg/dL
KETONES UR: NEGATIVE mg/dL
Nitrite: NEGATIVE
PROTEIN: NEGATIVE mg/dL
Specific Gravity, Urine: 1.008 (ref 1.005–1.030)
pH: 6.5 (ref 5.0–8.0)

## 2016-01-01 LAB — BASIC METABOLIC PANEL
Anion gap: 11 (ref 5–15)
BUN: 20 mg/dL (ref 6–20)
CHLORIDE: 105 mmol/L (ref 101–111)
CO2: 26 mmol/L (ref 22–32)
CREATININE: 0.99 mg/dL (ref 0.44–1.00)
Calcium: 10.2 mg/dL (ref 8.9–10.3)
GFR calc Af Amer: 59 mL/min — ABNORMAL LOW (ref >60)
GFR, EST NON AFRICAN AMERICAN: 51 mL/min — AB (ref >60)
GLUCOSE: 100 mg/dL — AB (ref 65–99)
POTASSIUM: 4.3 mmol/L (ref 3.5–5.1)
Sodium: 142 mmol/L (ref 135–145)

## 2016-01-01 LAB — URINE MICROSCOPIC-ADD ON

## 2016-01-01 MED ORDER — CEPHALEXIN 250 MG PO CAPS
250.0000 mg | ORAL_CAPSULE | Freq: Once | ORAL | Status: AC
  Administered 2016-01-01: 250 mg via ORAL
  Filled 2016-01-01: qty 1

## 2016-01-01 MED ORDER — CEPHALEXIN 250 MG PO CAPS: 250.0000 mg | ORAL_CAPSULE | Freq: Four times a day (QID) | ORAL | Status: DC

## 2016-01-01 MED ORDER — SODIUM CHLORIDE 0.9 % IV SOLN
INTRAVENOUS | Status: DC
  Administered 2016-01-01: 11:00:00 via INTRAVENOUS

## 2016-01-01 NOTE — ED Notes (Signed)
Patient got dizzy when she got out of bed this AM.  EMS reports that CBG was 130 and orthostatics were WDL.  Patient states that she is feeling well right now.

## 2016-01-01 NOTE — ED Notes (Signed)
Pt ambulated to the bathroom with standby assistance, no complaints

## 2016-01-01 NOTE — ED Provider Notes (Signed)
CSN: [id number]     Arrival date & time 01/01/16  0957 History   First MD Initiated Contact with Patient 01/01/16 1032     Chief Complaint  Patient presents with  . Dizziness     (Consider location/radiation/quality/duration/timing/severity/associated sxs/prior Treatment) HPI   [name] is a 82 y.o. female who presents reevaluation of dizziness which was noticed this morning when she got out of bed. The dizziness only occurs with standing. It resolves with rest. She presents for evaluation by EMS, transfer from her home where she lives alone. She denies headache, chest pain, focal weakness, paresthesias, nausea, vomiting, fever, chills or cough. No other recent illnesses. There are no other no modifying factors.   Past Medical History  Diagnosis Date  . Back pain   . UTI (urinary tract infection)   . Hypothyroid   . Diverticulitis   . Liver lesion   . Rectal polyp   . Lung nodule   . Mitral regurgitation 12/08/2014  . Sigmoid diverticulitis 04/23/2013    History of March 2014   . Villous adenoma of rectum 01/16/2013  . 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 Complete heart block ([place]) 08/31/2013  . Aortic insufficiency   . Hypertension    Past Surgical History  Procedure Laterality Date  . Abdominal hysterectomy      still has ovaries  . Flexible sigmoidoscopy N/A 01/17/2013    Procedure: FLEXIBLE SIGMOIDOSCOPY;  Surgeon: [name], MD;  Location: [facility];  Service: Gastroenterology;  Laterality: N/A;  . Pacemaker placement  09/01/2013    [employer]  . Inner ear surgery  1970's    due to infection  . Permanent pacemaker insertion N/A 09/01/2013    Procedure: PERMANENT PACEMAKER INSERTION;  Surgeon: [name], MD;  Location: [facility] CATH LAB;  Service: Cardiovascular;  Laterality: N/A;   Family History  Problem Relation Age of Onset  . Breast cancer Sister   . Cancer Sister     breast  . Diabetes Sister   . Heart disease Sister   . Heart  disease Mother   . Colon cancer Neg Hx   . Heart disease Father    Social History  Substance Use Topics  . Smoking status: Never Smoker   . Smokeless tobacco: Never Used  . Alcohol Use: No   OB History    No data available     Review of Systems  All other systems reviewed and are negative.     Allergies  Review of patient's allergies indicates no known allergies.  Home Medications   Prior to Admission medications   Medication Sig Start Date End Date Taking? Authorizing Provider  acetaminophen (TYLENOL) 500 MG tablet Take 500 mg by mouth every 6 (six) hours as needed (pain).    Yes Historical Provider, MD  levothyroxine (SYNTHROID, LEVOTHROID) 112 MCG tablet Take 112 mcg by mouth daily before breakfast.   Yes Historical Provider, MD  lisinopril (PRINIVIL,ZESTRIL) 2.5 MG tablet Take 2.5 mg by mouth daily.   Yes Historical Provider, MD  Multiple Vitamin (MULTIVITAMIN WITH MINERALS) TABS Take 1 tablet by mouth daily.   Yes Historical Provider, MD  cephALEXin (KEFLEX) 250 MG capsule Take 1 capsule (250 mg total) by mouth 4 (four) times daily. 01/01/16   Daleen Bo, MD   BP 111/52 mmHg  Pulse 59  Temp(Src) 98.4 F (36.9 C) (Oral)  Resp 21  Ht 5\' 6"  (1.676 m)  Wt 138 lb (62.596 kg)  BMI 22.28 kg/m2  SpO2 100% Physical Exam  Constitutional: She is oriented to person, place, and time. She appears well-developed. No distress.  Elderly, frail  HENT:  Head: Normocephalic and atraumatic.  Right Ear: External ear normal.  Left Ear: External ear normal.  Eyes: Conjunctivae and EOM are normal. Pupils are equal, round, and reactive to light.  Neck: Normal range of motion and phonation normal. Neck supple.  Cardiovascular: Normal rate, regular rhythm and normal heart sounds.   Pulmonary/Chest: Effort normal and breath sounds normal. She exhibits no bony tenderness.  Abdominal: Soft. There is no tenderness.  Musculoskeletal: Normal range of motion.  Neurological: She is alert and  oriented to person, place, and time. No cranial nerve deficit or sensory deficit. She exhibits normal muscle tone. Coordination normal.  No dysarthria and aphasia or nystagmus.  Skin: Skin is warm, dry and intact.  Psychiatric: She has a normal mood and affect. Her behavior is normal. Judgment and thought content normal.  Nursing note and vitals reviewed.   ED Course  Procedures (including critical care time) Medications  cephALEXin (KEFLEX) capsule 250 mg (250 mg Oral Given 01/01/16 1424)    Patient Vitals for the past 24 hrs:  BP Temp Temp src Pulse Resp SpO2 Height Weight  01/01/16 1400 (!) 111/52 mmHg - - (!) 59 21 100 % - -  01/01/16 1300 116/55 mmHg - - 69 17 98 % - -  01/01/16 1258 124/60 mmHg - - 73 18 99 % - -  01/01/16 1117 - 98.4 F (36.9 C) - - - - - -  01/01/16 1011 133/61 mmHg 98.3 F (36.8 C) Oral 68 18 96 % 5\' 6"  (1.676 m) 138 lb (62.596 kg)    Discharge- Reevaluation with update and discussion. After initial assessment and treatment, an updated evaluation reveals she is comfortable, tolerating liquids and has no further complaints. Findings discussed with the patient and all questions were answered. Nashaly Dorantes L    Labs Review Labs Reviewed  BASIC METABOLIC PANEL - Abnormal; Notable for the following:    Glucose, Bld 100 (*)    GFR calc non Af Amer 51 (*)    GFR calc Af Amer 59 (*)    All other components within normal limits  URINALYSIS, ROUTINE W REFLEX MICROSCOPIC (NOT AT [facility]) - Abnormal; Notable for the following:    APPearance CLOUDY (*)    Hgb urine dipstick MODERATE (*)    Leukocytes, UA LARGE (*)    All other components within normal limits  URINE MICROSCOPIC-ADD ON - Abnormal; Notable for the following:    Squamous Epithelial / LPF 6-30 (*)    Bacteria, UA MANY (*)    All other components within normal limits  URINE CULTURE  CBC WITH DIFFERENTIAL/PLATELET    Imaging Review No results found. I have personally reviewed and evaluated these  images and lab results as part of my medical decision-making.   EKG Interpretation None      MDM   Final diagnoses:  Urinary tract infection without hematuria, site unspecified  Dizziness    Patient with nonspecific dizziness, nontoxic in appearance, with reassuring evaluation. In emergency department.  Urinary tract infection, without evidence for pyelonephritis or spectral infection or metabolic instability. Patient is tolerating oral fluids in the emergency department. She is stable for discharge and outpatient management.  Nursing Notes Reviewed/ Care Coordinated Applicable Imaging Reviewed Interpretation of Laboratory Data incorporated into ED treatment  The patient appears reasonably screened and/or stabilized for discharge and I doubt any other medical  condition or other [facility] requiring further screening, evaluation, or treatment in the ED at this time prior to discharge.  Plan: Home Medications- Keflex; Home Treatments- rest; return here if the recommended treatment, does not improve the symptoms; Recommended follow up- PCP 1 week for check up    Daleen Bo, MD 01/02/16 [phone]

## 2016-01-01 NOTE — ED Notes (Signed)
Pt aware of urine sample needed, pt stated " i went earlier, i will try again

## 2016-01-01 NOTE — Discharge Instructions (Signed)
Get plenty of rest, and drink a lot of fluids.   Urinary Tract Infection Urinary tract infections (UTIs) can develop anywhere along your urinary tract. Your urinary tract is your body's drainage system for removing wastes and extra water. Your urinary tract includes two kidneys, two ureters, a bladder, and a urethra. Your kidneys are a pair of bean-shaped organs. Each kidney is about the size of your fist. They are located below your ribs, one on each side of your spine. CAUSES Infections are caused by microbes, which are microscopic organisms, including fungi, viruses, and bacteria. These organisms are so small that they can only be seen through a microscope. Bacteria are the microbes that most commonly cause UTIs. SYMPTOMS  Symptoms of UTIs may vary by age and gender of the patient and by the location of the infection. Symptoms in young women typically include a frequent and intense urge to urinate and a painful, burning feeling in the bladder or urethra during urination. Older women and men are more likely to be tired, shaky, and weak and have muscle aches and abdominal pain. A fever may mean the infection is in your kidneys. Other symptoms of a kidney infection include pain in your back or sides below the ribs, nausea, and vomiting. DIAGNOSIS To diagnose a UTI, your caregiver will ask you about your symptoms. Your caregiver will also ask you to provide a urine sample. The urine sample will be tested for bacteria and white blood cells. White blood cells are made by your body to help fight infection. TREATMENT  Typically, UTIs can be treated with medication. Because most UTIs are caused by a bacterial infection, they usually can be treated with the use of antibiotics. The choice of antibiotic and length of treatment depend on your symptoms and the type of bacteria causing your infection. HOME CARE INSTRUCTIONS  If you were prescribed antibiotics, take them exactly as your caregiver instructs you.  Finish the medication even if you feel better after you have only taken some of the medication.  Drink enough water and fluids to keep your urine clear or pale yellow.  Avoid caffeine, tea, and carbonated beverages. They tend to irritate your bladder.  Empty your bladder often. Avoid holding urine for long periods of time.  Empty your bladder before and after sexual intercourse.  After a bowel movement, women should cleanse from front to back. Use each tissue only once. SEEK MEDICAL CARE IF:   You have back pain.  You develop a fever.  Your symptoms do not begin to resolve within 3 days. SEEK IMMEDIATE MEDICAL CARE IF:   You have severe back pain or lower abdominal pain.  You develop chills.  You have nausea or vomiting.  You have continued burning or discomfort with urination. MAKE SURE YOU:   Understand these instructions.  Will watch your condition.  Will get help right away if you are not doing well or get worse.   This information is not intended to replace advice given to you by your health care provider. Make sure you discuss any questions you have with your health care provider.   Document Released: 07/26/2005 Document Revised: 07/07/2015 Document Reviewed: 11/24/2011 Elsevier Interactive Patient Education [employer].

## 2016-01-01 NOTE — ED Notes (Signed)
Patient given a [place] sandwich.  Tolerating  PO fluids.

## 2016-01-03 LAB — URINE CULTURE
Culture: >=100000
SPECIAL REQUESTS: NORMAL

## 2016-01-04 ENCOUNTER — Telehealth (HOSPITAL_BASED_OUTPATIENT_CLINIC_OR_DEPARTMENT_OTHER): Payer: Self-pay | Admitting: Emergency Medicine

## 2016-01-04 NOTE — Telephone Encounter (Signed)
Post ED Visit - Positive Culture Follow-up  Culture report reviewed by antimicrobial stewardship pharmacist:  []  Elenor Quinones, Pharm.D. []  Heide Guile, Pharm.D., BCPS []  Parks Neptune, Pharm.D. []  Alycia Rossetti, Pharm.D., BCPS []  [place], Pharm.D., BCPS, AAHIVP []  Legrand Como, Pharm.D., BCPS, AAHIVP []  Milus Glazier, Pharm.D. []  Stephens November, Pharm.D. Angela Cox Pharm D  Positive urine culture E.coli Treated with Cephalexin, organism sensitive to the same and no further patient follow-up is required at this time.  Hazle Nordmann 01/04/2016, 9:49 AM

## 2016-01-19 ENCOUNTER — Telehealth: Payer: Self-pay | Admitting: Family Medicine

## 2016-02-17 ENCOUNTER — Ambulatory Visit (INDEPENDENT_AMBULATORY_CARE_PROVIDER_SITE_OTHER): Payer: Medicare Other | Admitting: Family Medicine

## 2016-02-17 ENCOUNTER — Encounter: Payer: Self-pay | Admitting: Family Medicine

## 2016-02-17 VITALS — BP 112/62 | HR 74 | Temp 97.5°F | Ht 66.0 in | Wt 135.0 lb

## 2016-02-17 DIAGNOSIS — I340 Nonrheumatic mitral (valve) insufficiency: Secondary | ICD-10-CM

## 2016-02-17 DIAGNOSIS — E039 Hypothyroidism, unspecified: Secondary | ICD-10-CM | POA: Diagnosis not present

## 2016-02-17 DIAGNOSIS — Z6822 Body mass index (BMI) 22.0-22.9, adult: Secondary | ICD-10-CM | POA: Diagnosis not present

## 2016-02-17 DIAGNOSIS — Z1211 Encounter for screening for malignant neoplasm of colon: Secondary | ICD-10-CM

## 2016-02-17 DIAGNOSIS — Z0000 Encounter for general adult medical examination without abnormal findings: Secondary | ICD-10-CM | POA: Diagnosis not present

## 2016-02-17 DIAGNOSIS — D375 Neoplasm of uncertain behavior of rectum: Secondary | ICD-10-CM

## 2016-02-17 DIAGNOSIS — I442 Atrioventricular block, complete: Secondary | ICD-10-CM

## 2016-02-17 DIAGNOSIS — Z950 Presence of cardiac pacemaker: Secondary | ICD-10-CM | POA: Diagnosis not present

## 2016-02-17 DIAGNOSIS — I10 Essential (primary) hypertension: Secondary | ICD-10-CM

## 2016-02-17 NOTE — Patient Instructions (Addendum)
Medicare Annual Wellness Visit  [place] and the medical providers at [facility] strive to bring you the best medical care.  In doing so we not only want to address your current medical conditions and concerns but also to detect new conditions early and prevent illness, disease and health-related problems.    Medicare offers a yearly Wellness Visit which allows our clinical staff to assess your need for preventative services including immunizations, lifestyle education, counseling to decrease risk of preventable diseases and screening for fall risk and other medical concerns.    This visit is provided free of charge (no copay) for all Medicare recipients. The clinical pharmacists at [facility] have begun to conduct these Wellness Visits which will also include a thorough review of all your medications.    As you primary medical provider recommend that you make an appointment for your Annual Wellness Visit if you have not done so already this year.  You may set up this appointment before you leave today or you may call back [phone] and schedule an appointment.  Please make sure when you call that you mention that you are scheduling your Annual Wellness Visit with the clinical pharmacist so that the appointment may be made for the proper length of time.     Continue current medications. Continue good therapeutic lifestyle changes which include good diet and exercise. Fall precautions discussed with patient. If an FOBT was given today- please return it to our front desk. If you are over 63 years old - you may need Prevnar 24 or the adult Pneumonia vaccine.  **Flu shots are available--- please call and schedule a FLU-CLINIC appointment**  After your visit with [place] today you will receive a survey in the mail or online from [employer] regarding your care with [place]. Please take a moment to fill this out. Your feedback is very  important to [place] as you can help [place] better understand your patient needs as well as improve your experience and satisfaction. WE CARE ABOUT YOU!!!   The patient is encouraged to eat healthy and keep her weight up. She is encouraged to stay active and to not put herself at risk for falling She is encouraged to get a Lifeline so that if she fell at home and cannot get to the phone that she could be able to reach someone easily. She should continue to follow-up with cardiology and vascular surgeon Dr. Vernon Prey. She is encouraged to continue to follow-up with her gastroenterologist.

## 2016-02-17 NOTE — Progress Notes (Signed)
 Subjective:    Patient ID: [name], female    DOB: 10/20/1928, 82 y.o.   MRN: [id number]  HPI Pt here for follow up and management of chronic medical problems which includes hypothyroid. She is taking medications regularly.The patient continues to do well with no specific complaints. She is followed regularly by the cardiologist and the gastroenterologist. She has a pacemaker in place. She also has had a history of a villous adenoma of the rectum.  He is been taking her thyroid medicine regularly. The patient has had some dizziness which she has had in the past. She denies any chest pain or shortness of breath. She is not having any trouble with nausea vomiting diarrhea or blood in the stool. She does have occasional heartburn indigestion and takes ranitidine for this. She is passing her water without problems. She does have some ongoing back pain and generalized arthritic stiffness. She lives by herself at home and does not have a Lifeline.      Patient Active Problem List   Diagnosis Date Noted  . Varicose veins of leg with complications   . Bleeding from varicose vein 01/05/2015  . Acute blood loss anemia 01/05/2015  . Stokes-Adams syncope   . Complete heart block (H[facility])   . Aortic insufficiency   . Hypertension   . Mitral regurgitation 12/08/2014  . Palpitations 11/11/2014  . SOB (shortness of breath) 11/11/2014  . Cardiac pacemaker in situ 07/30/2014  . Adenomatous polyp of rectum 07/30/2014  . Osteopenia of the elderly 03/25/2014  . Systolic ejection murmur 01/26/2014  . History of pacemaker 09/04/2013  . Near syncope 08/29/2013  . Fall at home, with facial injury 08/29/2013  . UTI (lower urinary tract infection) 08/29/2013  . Sigmoid diverticulitis 04/23/2013  . Hypothyroidism 04/23/2013  . Chronic back pain 04/23/2013  . Villous adenoma of rectum 01/16/2013  . Mass of left lobe of liver 01/16/2013   Outpatient Encounter Prescriptions as of 02/17/2016    Medication Sig  . acetaminophen (TYLENOL) 500 MG tablet Take 500 mg by mouth every 6 (six) hours as needed (pain).   . levothyroxine (SYNTHROID, LEVOTHROID) 112 MCG tablet Take 112 mcg by mouth daily before breakfast.  . lisinopril (PRINIVIL,ZESTRIL) 2.5 MG tablet Take 2.5 mg by mouth daily.  . Multiple Vitamin (MULTIVITAMIN WITH MINERALS) TABS Take 1 tablet by mouth daily.  . [DISCONTINUED] cephALEXin (KEFLEX) 250 MG capsule Take 1 capsule (250 mg total) by mouth 4 (four) times daily.   No facility-administered encounter medications on file as of 02/17/2016.      Review of Systems  Constitutional: Negative.   HENT: Negative.   Eyes: Negative.   Respiratory: Negative.   Cardiovascular: Negative.   Gastrointestinal: Negative.   Endocrine: Negative.   Genitourinary: Negative.   Musculoskeletal: Negative.   Skin: Negative.   Allergic/Immunologic: Negative.   Neurological: Negative.   Hematological: Negative.   Psychiatric/Behavioral: Negative.        Objective:   Physical Exam  Constitutional: She is oriented to person, place, and time. No distress.  Somewhat thin and elderly but alert with slightly decreased hearing  HENT:  Head: Normocephalic and atraumatic.  Right Ear: External ear normal.  Left Ear: External ear normal.  Nose: Nose normal.  Mouth/Throat: Oropharynx is clear and moist. No oropharyngeal exudate.  Eyes: Conjunctivae and EOM are normal. Pupils are equal, round, and reactive to light. Right eye exhibits no discharge. Left eye exhibits no discharge. No scleral icterus.  Neck: Normal range of motion.   Neck supple. No thyromegaly present.  No thyromegaly anterior cervical adenopathy or bruits  Cardiovascular: Normal rate, regular rhythm and intact distal pulses.   Murmur heard. There is a grade 4/6 systolic ejection murmur that radiates to the carotids The heart is regular at 72/m  Pulmonary/Chest: Effort normal and breath sounds normal. No respiratory distress.  She has no wheezes. She has no rales. She exhibits no tenderness.  Clear anteriorly and posteriorly  Abdominal: Soft. Bowel sounds are normal. She exhibits no mass. There is no tenderness. There is no rebound and no guarding.  No abdominal tenderness bruits or organ enlargement  Musculoskeletal: Normal range of motion. She exhibits no edema or tenderness.  Lymphadenopathy:    She has no cervical adenopathy.  Neurological: She is alert and oriented to person, place, and time. She has normal reflexes. No cranial nerve deficit.  Skin: Skin is warm and dry. No rash noted.  Psychiatric: She has a normal mood and affect. Her behavior is normal. Judgment and thought content normal.  Nursing note and vitals reviewed.  BP 112/62 mmHg  Pulse 74  Temp(Src) 97.5 F (36.4 C) (Oral)  Ht 5' 6" (1.676 m)  Wt 135 lb (61.236 kg)  BMI 21.80 kg/m2        Assessment & Plan:  1. Hypothyroidism, unspecified hypothyroidism type -Continue with current thyroid treatment pending results of lab work - CBC with Differential/Platelet - Thyroid Panel With TSH  2. Cardiac pacemaker in situ -Continue follow-up with cardiology - BMP8+EGFR - CBC with Differential/Platelet - Hepatic function panel  3. Mitral valve insufficiency -Continue follow-up with cardiology - BMP8+EGFR - CBC with Differential/Platelet  4. Health care maintenance - BMP8+EGFR - CBC with Differential/Platelet - Hepatic function panel - Lipid panel  5. Special screening for malignant neoplasms, colon - Fecal occult blood, imunochemical; Future  6. Villous adenoma of rectum -Continue follow-up with gastroenterology  7. Complete heart block ([place]) -The patient now has a pacemaker and is doing well her heart and sees the cardiologist regularly.  8. Essential hypertension -The blood pressure is good today and patient will continue with current treatment  9. Body mass index (BMI) of 22.0-22.9 in adult -Continue to eat healthy and  stay active  Patient Instructions                       Medicare Annual Wellness Visit  [place] and the medical providers at [facility] strive to bring you the best medical care.  In doing so we not only want to address your current medical conditions and concerns but also to detect new conditions early and prevent illness, disease and health-related problems.    Medicare offers a yearly Wellness Visit which allows our clinical staff to assess your need for preventative services including immunizations, lifestyle education, counseling to decrease risk of preventable diseases and screening for fall risk and other medical concerns.    This visit is provided free of charge (no copay) for all Medicare recipients. The clinical pharmacists at [facility] have begun to conduct these Wellness Visits which will also include a thorough review of all your medications.    As you primary medical provider recommend that you make an appointment for your Annual Wellness Visit if you have not done so already this year.  You may set up this appointment before you leave today or you may call back [phone] and schedule an appointment.  Please make sure when you call that  you mention that you are scheduling your Annual Wellness Visit with the clinical pharmacist so that the appointment may be made for the proper length of time.     Continue current medications. Continue good therapeutic lifestyle changes which include good diet and exercise. Fall precautions discussed with patient. If an FOBT was given today- please return it to our front desk. If you are over 50 years old - you may need Prevnar 13 or the adult Pneumonia vaccine.  **Flu shots are available--- please call and schedule a FLU-CLINIC appointment**  After your visit with us today you will receive a survey in the mail or online from [employer] regarding your care with us. Please take a moment to fill  this out. Your feedback is very important to us as you can help us better understand your patient needs as well as improve your experience and satisfaction. WE CARE ABOUT YOU!!!   The patient is encouraged to eat healthy and keep her weight up. She is encouraged to stay active and to not put herself at risk for falling She is encouraged to get a Lifeline so that if she fell at home and cannot get to the phone that she could be able to reach someone easily. She should continue to follow-up with cardiology and vascular surgeon Dr. 01. She is encouraged to continue to follow-up with her gastroenterologist.   Don W.  MD   

## 2016-02-18 LAB — CBC WITH DIFFERENTIAL/PLATELET
BASOS ABS: 0.1 10*3/uL (ref 0.0–0.2)
Basos: 1 %
EOS (ABSOLUTE): 0.2 10*3/uL (ref 0.0–0.4)
Eos: 3 %
HEMOGLOBIN: 13.3 g/dL (ref 11.1–15.9)
Hematocrit: 40.8 % (ref 34.0–46.6)
IMMATURE GRANS (ABS): 0 10*3/uL (ref 0.0–0.1)
IMMATURE GRANULOCYTES: 0 %
LYMPHS: 20 %
Lymphocytes Absolute: 1.2 10*3/uL (ref 0.7–3.1)
MCH: 32.1 pg (ref 26.6–33.0)
MCHC: 32.6 g/dL (ref 31.5–35.7)
MCV: 99 fL — ABNORMAL HIGH (ref 79–97)
MONOCYTES: 9 %
Monocytes Absolute: 0.5 10*3/uL (ref 0.1–0.9)
NEUTROS ABS: 4 10*3/uL (ref 1.4–7.0)
Neutrophils: 67 %
Platelets: 296 10*3/uL (ref 150–379)
RBC: 4.14 x10E6/uL (ref 3.77–5.28)
RDW: 14.9 % (ref 12.3–15.4)
WBC: 5.9 10*3/uL (ref 3.4–10.8)

## 2016-02-18 LAB — LIPID PANEL
CHOLESTEROL TOTAL: 216 mg/dL — AB (ref 100–199)
Chol/HDL Ratio: 3 ratio units (ref 0.0–4.4)
HDL: 72 mg/dL (ref >39)
LDL CALC: 124 mg/dL — AB (ref 0–99)
TRIGLYCERIDES: 99 mg/dL (ref 0–149)
VLDL CHOLESTEROL CAL: 20 mg/dL (ref 5–40)

## 2016-02-18 LAB — THYROID PANEL WITH TSH
Free Thyroxine Index: 2 (ref 1.2–4.9)
T3 Uptake Ratio: 28 % (ref 24–39)
T4, Total: 7.1 ug/dL (ref 4.5–12.0)
TSH: 7.43 u[IU]/mL — ABNORMAL HIGH (ref 0.450–4.500)

## 2016-02-18 LAB — BMP8+EGFR
BUN/Creatinine Ratio: 16 (ref 12–28)
BUN: 15 mg/dL (ref 8–27)
CALCIUM: 9.8 mg/dL (ref 8.7–10.3)
CO2: 26 mmol/L (ref 18–29)
Chloride: 100 mmol/L (ref 96–106)
Creatinine, Ser: 0.95 mg/dL (ref 0.57–1.00)
GFR calc Af Amer: 63 mL/min/{1.73_m2} (ref >59)
GFR calc non Af Amer: 55 mL/min/{1.73_m2} — ABNORMAL LOW (ref >59)
Glucose: 105 mg/dL — ABNORMAL HIGH (ref 65–99)
Potassium: 5.1 mmol/L (ref 3.5–5.2)
Sodium: 140 mmol/L (ref 134–144)

## 2016-02-18 LAB — HEPATIC FUNCTION PANEL
ALBUMIN: 4.3 g/dL (ref 3.5–4.7)
ALK PHOS: 62 IU/L (ref 39–117)
ALT: 10 IU/L (ref 0–32)
AST: 21 IU/L (ref 0–40)
BILIRUBIN TOTAL: 0.5 mg/dL (ref 0.0–1.2)
BILIRUBIN, DIRECT: 0.13 mg/dL (ref 0.00–0.40)
TOTAL PROTEIN: 6.9 g/dL (ref 6.0–8.5)

## 2016-02-24 ENCOUNTER — Other Ambulatory Visit: Payer: Medicare Other

## 2016-02-24 DIAGNOSIS — Z1211 Encounter for screening for malignant neoplasm of colon: Secondary | ICD-10-CM

## 2016-03-01 ENCOUNTER — Encounter: Payer: Self-pay | Admitting: *Deleted

## 2016-03-01 LAB — FECAL OCCULT BLOOD, IMMUNOCHEMICAL: FECAL OCCULT BLD: NEGATIVE

## 2016-03-17 ENCOUNTER — Other Ambulatory Visit: Payer: Self-pay | Admitting: *Deleted

## 2016-03-17 MED ORDER — LEVOTHYROXINE SODIUM 25 MCG PO TABS: ORAL_TABLET | ORAL | Status: DC

## 2016-03-17 NOTE — Telephone Encounter (Signed)
Pt labs reviewed and 12.5 thyroid med called in

## 2016-03-20 ENCOUNTER — Ambulatory Visit (INDEPENDENT_AMBULATORY_CARE_PROVIDER_SITE_OTHER): Payer: Medicare Other | Admitting: Thoracic Surgery (Cardiothoracic Vascular Surgery)

## 2016-03-20 ENCOUNTER — Encounter: Payer: Self-pay | Admitting: Thoracic Surgery (Cardiothoracic Vascular Surgery)

## 2016-03-20 VITALS — BP 134/70 | HR 84 | Resp 20 | Ht 66.0 in | Wt 135.0 lb

## 2016-03-20 DIAGNOSIS — I351 Nonrheumatic aortic (valve) insufficiency: Secondary | ICD-10-CM

## 2016-03-20 DIAGNOSIS — I340 Nonrheumatic mitral (valve) insufficiency: Secondary | ICD-10-CM

## 2016-03-20 DIAGNOSIS — I442 Atrioventricular block, complete: Secondary | ICD-10-CM

## 2016-03-20 DIAGNOSIS — I459 Conduction disorder, unspecified: Secondary | ICD-10-CM | POA: Diagnosis not present

## 2016-03-20 NOTE — Progress Notes (Signed)
[address]       [place] [address]             [phone]     CARDIOTHORACIC SURGERY OFFICE NOTE  Referring Provider is [name], MD PCP is [name], MD   HPI:  Patient is an 82 year old female who returns to the office today for follow-up of primary mitral regurgitation and at least moderate aortic insufficiency. She was originally seen in consultation on 12/10/2014 and she was last seen here in our office on 03/15/2015.  She has been reluctant to consider any further diagnostic testing or the possibility of surgical intervention, and she has remained quite stable with very mild symptoms of exertional shortness of breath and fatigue consistent with chronic diastolic congestive heart failure, New York Heart Association functional class II. The patient admits that she is not very active physically, but she lives alone and remains entirely functionally independent.  She states that over the past year nothing has changed significantly. She has not decreased her activity level to any significant degree in comparison with previously, although she admits that she remains fairly sedentary. She still drives a car and tends to simple chores around the house. She has not experienced any significant change in symptoms of exertional shortness of breath. She states that she does get tired easily and she typically complains primarily of weakness in her legs with ambulation. She denies any history of acute exacerbations of shortness of breath, PND, orthopnea, or lower extremity edema. She has not developed any new medical problems over the past year. She had a brief dizzy spell recently for which she presented to the emergency department. She was told that she might have had a mild urinary tract infection at that time.  She continues to follow-up in her middle ear with Dr. Lovena Le who interrogated her pacemaker recently. She has not had a follow-up echocardiogram performed  since January 2016. The remainder of her review of systems is noncontributory.   Current Outpatient Prescriptions  Medication Sig Dispense Refill  . acetaminophen (TYLENOL) 500 MG tablet Take 500 mg by mouth every 6 (six) hours as needed (pain).     Marland Kitchen levothyroxine (LEVOTHROID) 25 MCG tablet Take a 1/2 tab on Monday, Wednesday and Friday. 45 tablet 3  . levothyroxine (SYNTHROID, LEVOTHROID) 112 MCG tablet Take 112 mcg by mouth daily before breakfast.    . lisinopril (PRINIVIL,ZESTRIL) 2.5 MG tablet Take 2.5 mg by mouth daily.    . Multiple Vitamin (MULTIVITAMIN WITH MINERALS) TABS Take 1 tablet by mouth daily.     No current facility-administered medications for this visit.      Physical Exam:   BP 134/70 mmHg  Pulse 84  Resp 20  Ht 5\' 6"  (1.676 m)  Wt 135 lb (61.236 kg)  BMI 21.80 kg/m2  SpO2 99%  General:  Elderly but well-appearing  Chest:   Clear to auscultation  CV:   Regular rate and rhythm with prominent holosystolic murmur  Incisions:  n/a  Abdomen:  Soft nontender  Extremities:  Warm and well-perfused  Diagnostic Tests:  n/a   Impression:  Patient has stage D severe symptomatic primary mitral regurgitation and at least moderate aortic insufficiency. She remains clinically stable with mild symptoms of exertional shortness of breath and fatigue consistent with chronic diastolic congestive heart failure, New York Heart Association functional class II. Her symptoms do not significantly affect her day-to-day lifestyle and they apparently have not progressed over the past  year.  Stress testing could be considered to further stratify the patient's risks, the patient remains very reluctant to consider further diagnostic testing or surgery. At present she seems to be doing fine on medical therapy.   Plan:  I have reviewed the natural history of chronic primary mitral regurgitation with the patient and her close friend here in the office today. We discussed the signs and  symptoms of disease progression and the need for close follow-up.  We discussed the possibility of stress testing to further stratify her risk. At a bare minimum I would recommend biannual follow-up examinations and annual transthoracic echocardiogram. The patient will return to our office in 1 year or sooner should specific problems or questions arise.  All of her questions have been addressed.  I spent in excess of 15 minutes during the conduct of this office consultation and >50% of this time involved direct face-to-face encounter with the patient for counseling and/or coordination of their care.    Valentina Gu. Roxy Manns, MD 03/20/2016 5:22 PM

## 2016-03-20 NOTE — Patient Instructions (Signed)
Continue all previous medications without any changes at this time  

## 2016-03-21 ENCOUNTER — Ambulatory Visit (INDEPENDENT_AMBULATORY_CARE_PROVIDER_SITE_OTHER): Payer: Medicare Other | Admitting: *Deleted

## 2016-03-21 ENCOUNTER — Telehealth: Payer: Self-pay | Admitting: Cardiology

## 2016-03-21 DIAGNOSIS — I442 Atrioventricular block, complete: Secondary | ICD-10-CM

## 2016-03-21 NOTE — Progress Notes (Signed)
Remote pacemaker transmission.   

## 2016-03-21 NOTE — Telephone Encounter (Signed)
Spoke with pt and reminded pt of remote transmission that is due today. Pt verbalized understanding.   

## 2016-04-09 LAB — CUP PACEART REMOTE DEVICE CHECK
Battery Impedance: 161 Ohm
Brady Statistic AP VP Percent: 15 %
Brady Statistic AP VS Percent: 1 %
Brady Statistic AS VP Percent: 11 %
Brady Statistic AS VS Percent: 73 %
Date Time Interrogation Session: 20170523142749
Implantable Lead Implant Date: 20141103
Implantable Lead Location: 753859
Implantable Lead Location: 753860
Lead Channel Impedance Value: 512 Ohm
Lead Channel Pacing Threshold Amplitude: 0.625 V
Lead Channel Pacing Threshold Amplitude: 0.625 V
Lead Channel Sensing Intrinsic Amplitude: 2.8 mV
Lead Channel Sensing Intrinsic Amplitude: 5.6 mV
Lead Channel Setting Pacing Amplitude: 2 V
MDC IDC LEAD IMPLANT DT: 20141103
MDC IDC MSMT BATTERY REMAINING LONGEVITY: 135 mo
MDC IDC MSMT BATTERY VOLTAGE: 2.79 V
MDC IDC MSMT LEADCHNL RA IMPEDANCE VALUE: 357 Ohm
MDC IDC MSMT LEADCHNL RA PACING THRESHOLD PULSEWIDTH: 0.4 ms
MDC IDC MSMT LEADCHNL RV PACING THRESHOLD PULSEWIDTH: 0.4 ms
MDC IDC SET LEADCHNL RV PACING AMPLITUDE: 2.5 V
MDC IDC SET LEADCHNL RV PACING PULSEWIDTH: 0.4 ms
MDC IDC SET LEADCHNL RV SENSING SENSITIVITY: 2 mV

## 2016-04-18 ENCOUNTER — Encounter: Payer: Self-pay | Admitting: Cardiology

## 2016-04-28 ENCOUNTER — Other Ambulatory Visit: Payer: Medicare Other

## 2016-04-28 ENCOUNTER — Other Ambulatory Visit: Payer: Self-pay | Admitting: *Deleted

## 2016-04-28 DIAGNOSIS — E039 Hypothyroidism, unspecified: Secondary | ICD-10-CM

## 2016-04-29 LAB — THYROID PANEL WITH TSH
FREE THYROXINE INDEX: 2.9 (ref 1.2–4.9)
T3 UPTAKE RATIO: 31 % (ref 24–39)
T4 TOTAL: 9.5 ug/dL (ref 4.5–12.0)
TSH: 0.48 u[IU]/mL (ref 0.450–4.500)

## 2016-05-04 ENCOUNTER — Telehealth: Payer: Self-pay | Admitting: Internal Medicine

## 2016-05-04 NOTE — Telephone Encounter (Signed)
The pt wants to wait until she sees Dr Lovena Le on 7/21 to discuss if she needs an Echo or not.

## 2016-05-04 NOTE — Telephone Encounter (Signed)
New message     The pt was told by Dr. Rudell Cobb that when she saw Dr. Lovena Le she was suppose to have a Echo done but there is no order for the Echo she has a appointment on July 21 at 11:30 to see Dr. Lovena Le.

## 2016-05-19 ENCOUNTER — Encounter: Payer: Self-pay | Admitting: Internal Medicine

## 2016-06-14 ENCOUNTER — Encounter: Payer: Self-pay | Admitting: Internal Medicine

## 2016-06-14 ENCOUNTER — Ambulatory Visit (INDEPENDENT_AMBULATORY_CARE_PROVIDER_SITE_OTHER): Payer: Medicare Other | Admitting: Internal Medicine

## 2016-06-14 VITALS — BP 152/60 | HR 75 | Ht 63.0 in | Wt 134.0 lb

## 2016-06-14 DIAGNOSIS — Z95810 Presence of automatic (implantable) cardiac defibrillator: Secondary | ICD-10-CM

## 2016-06-14 DIAGNOSIS — I340 Nonrheumatic mitral (valve) insufficiency: Secondary | ICD-10-CM | POA: Diagnosis not present

## 2016-06-14 LAB — CUP PACEART INCLINIC DEVICE CHECK
Battery Remaining Longevity: 136 mo
Brady Statistic AP VP Percent: 14 %
Date Time Interrogation Session: 20170816121547
Implantable Lead Implant Date: 20141103
Implantable Lead Model: 5076
Lead Channel Pacing Threshold Amplitude: 0.25 V
Lead Channel Pacing Threshold Amplitude: 0.5 V
Lead Channel Setting Pacing Amplitude: 2.5 V
Lead Channel Setting Sensing Sensitivity: 2 mV
MDC IDC LEAD IMPLANT DT: 20141103
MDC IDC LEAD LOCATION: 753859
MDC IDC LEAD LOCATION: 753860
MDC IDC MSMT BATTERY IMPEDANCE: 161 Ohm
MDC IDC MSMT BATTERY VOLTAGE: 2.79 V
MDC IDC MSMT LEADCHNL RA IMPEDANCE VALUE: 357 Ohm
MDC IDC MSMT LEADCHNL RA PACING THRESHOLD PULSEWIDTH: 0.4 ms
MDC IDC MSMT LEADCHNL RA SENSING INTR AMPL: 2.8 mV
MDC IDC MSMT LEADCHNL RV IMPEDANCE VALUE: 494 Ohm
MDC IDC MSMT LEADCHNL RV PACING THRESHOLD PULSEWIDTH: 0.4 ms
MDC IDC MSMT LEADCHNL RV SENSING INTR AMPL: 8 mV
MDC IDC SET LEADCHNL RA PACING AMPLITUDE: 2 V
MDC IDC SET LEADCHNL RV PACING PULSEWIDTH: 0.4 ms
MDC IDC STAT BRADY AP VS PERCENT: 1 %
MDC IDC STAT BRADY AS VP PERCENT: 8 %
MDC IDC STAT BRADY AS VS PERCENT: 77 %

## 2016-06-14 NOTE — Patient Instructions (Addendum)
Medication Instructions: Your physician recommends that you continue on your current medications as directed. Please refer to the Current Medication list given to you today.   Labwork: NONE ORDERED  Procedures/Testing: Your physician has requested that you have an echocardiogram (to be scheduled in February). Echocardiography is a painless test that uses sound waves to create images of your heart. It provides your doctor with information about the size and shape of your heart and how well your heart's chambers and valves are working. This procedure takes approximately one hour. There are no restrictions for this procedure.   Follow-Up: Your physician wants you to follow-up in 1 YEAR with Dr. Lovena Le. You will receive a reminder letter in the mail two months in advance. If you don't receive a letter, please call our office to schedule the follow-up appointment.  Remote monitoring is used to monitor your Pacemaker of ICD from home. This monitoring reduces the number of office visits required to check your device to one time per year. It allows [place] to keep an eye on the functioning of your device to ensure it is working properly. You are scheduled for a device check from home on 09/13/16. You may send your transmission at any time that day. If you have a wireless device, the transmission will be sent automatically. After your physician reviews your transmission, you will receive a postcard with your next transmission date.     Any Additional Special Instructions Will Be Listed Below (If Applicable).     If you need a refill on your cardiac medications before your next appointment, please call your pharmacy.

## 2016-06-14 NOTE — Progress Notes (Signed)
HPI [name] returns today for followup. She is a pleasant 82 yo woman with CHB, s/p PPM placed just over 2 years ago. In the interim, she had been fairly stable despite her mitral regurgitation. She saw Dr. Roxy Manns who recommended watchful waiting.  She has preserved left ventricular systolic function by echo previously. She has not had syncope, and has class II symptoms. No Known Allergies   Current Outpatient Prescriptions  Medication Sig Dispense Refill  . acetaminophen (TYLENOL) 500 MG tablet Take 500 mg by mouth every 6 (six) hours as needed (pain).     Marland Kitchen levothyroxine (LEVOTHROID) 25 MCG tablet Take a 1/2 tab on Monday, Wednesday and Friday. 45 tablet 3  . levothyroxine (SYNTHROID, LEVOTHROID) 112 MCG tablet Take 112 mcg by mouth daily before breakfast.    . lisinopril (PRINIVIL,ZESTRIL) 2.5 MG tablet Take 2.5 mg by mouth daily.    . Multiple Vitamin (MULTIVITAMIN WITH MINERALS) TABS Take 1 tablet by mouth daily.     No current facility-administered medications for this visit.      Past Medical History:  Diagnosis Date  . Aortic insufficiency   . Back pain   . Complete heart block ([place]) 08/31/2013  . Diverticulitis   . Hypertension   . Hypothyroid   . Liver lesion   . Lung nodule   . Mitral regurgitation 12/08/2014  . Rectal polyp   . Sigmoid diverticulitis 04/23/2013   History of March 2014   . 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 UTI (urinary tract infection)   . Villous adenoma of rectum 01/16/2013    ROS:   All systems reviewed and negative except as noted in the HPI.   Past Surgical History:  Procedure Laterality Date  . ABDOMINAL HYSTERECTOMY     still has ovaries  . FLEXIBLE SIGMOIDOSCOPY N/A 01/17/2013   Procedure: FLEXIBLE SIGMOIDOSCOPY;  Surgeon: [name], MD;  Location: [facility];  Service: Gastroenterology;  Laterality: N/A;  . INNER EAR SURGERY  1970's   due to infection  . PACEMAKER PLACEMENT  09/01/2013   Medtronic   . PERMANENT PACEMAKER INSERTION N/A 09/01/2013   Procedure: PERMANENT PACEMAKER INSERTION;  Surgeon: [name], MD;  Location: [facility] C[facility] LAB;  Service: Cardiovascular;  Laterality: N/A;     Family History  Problem Relation Age of Onset  . Heart disease Mother   . Heart disease Father   . Breast cancer Sister   . Cancer Sister     breast  . Diabetes Sister   . Heart disease Sister   . Colon cancer Neg Hx      Social History   Social History  . Marital status: Widowed    Spouse name: N/A  . Number of children: 1  . Years of education: N/A   Occupational History  . retired    Social History Main Topics  . Smoking status: Never Smoker  . Smokeless tobacco: Never Used  . Alcohol use No  . Drug use: No  . Sexual activity: Not on file   Other Topics Concern  . Not on file   Social History Narrative  . No narrative on file     BP (!) 152/60   Pulse 75   Ht 5\' 3"  (1.6 m)   Wt 134 lb (60.8 kg)   BMI 23.74 kg/m   Physical Exam:  Well appearing elderly woman, NAD HEENT: Unremarkable Neck:  No JVD, no thyromegally Back:  No CVA tenderness Lungs:  Clear with no wheezes, well-healed pacemaker incision. HEART:  Regular rate rhythm, 2/6 systolic murmurs, no rubs, no clicks Abd:  soft, positive bowel sounds, no organomegally, no rebound, no guarding Ext:  2 plus pulses, no edema, no cyanosis, no clubbing Skin:  No rashes no nodules Neuro:  CN II through XII intact, motor grossly intact  ECG - NSR with RBBB  DEVICE  Normal device function.  See PaceArt for details.   Assess/Plan: 1. 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 2. Medtronic DDD PM - her device has been interogated today and is working normally. Will follow. 3. Mitral regurgitation - she has severe MR by echo in the setting of normal LV function and minimal symptoms. At her advanced age and lack of symptoms, she will undergo watchful waiting 4. HTN - her blood pressure  is well controlled on low dose lisinopril.  Mikle Bosworth.D.

## 2016-06-21 ENCOUNTER — Ambulatory Visit (INDEPENDENT_AMBULATORY_CARE_PROVIDER_SITE_OTHER): Payer: Medicare Other | Admitting: Family Medicine

## 2016-06-21 ENCOUNTER — Encounter: Payer: Self-pay | Admitting: Family Medicine

## 2016-06-21 VITALS — BP 119/67 | HR 92 | Temp 99.0°F | Ht 63.0 in | Wt 133.0 lb

## 2016-06-21 DIAGNOSIS — J040 Acute laryngitis: Secondary | ICD-10-CM

## 2016-06-21 MED ORDER — FLUTICASONE PROPIONATE 50 MCG/ACT NA SUSP: 1.0000 | Freq: Two times a day (BID) | NASAL | 6 refills | Status: DC | PRN

## 2016-06-21 NOTE — Progress Notes (Signed)
BP 119/67 (BP Location: Right Arm, Patient Position: Sitting, Cuff Size: Normal)   Pulse 92   Temp 99 F (37.2 C) (Oral)   Ht 5\' 3"  (1.6 m)   Wt 133 lb (60.3 kg)   BMI 23.56 kg/m    Subjective:    Patient ID: [name], female    DOB: Dec 09, 1927, 82 y.o.   MRN: [id number]  HPI: [name] is a 82 y.o. female presenting on 06/21/2016 for Cough (x 1 day) and Hoarse (woke up this morning with hoarseness)   HPI Cough and hoarseness Patient comes in today with complaints of cough and hoarseness. The cough started yesterday and is been nonproductive. The hoarseness started just this morning. She denies any fevers or chills or shortness of breath or wheezing. She denies any sick contacts that she knows of. She called EMS this morning because of the hoarseness and they came and evaluated her and said that she can just go see her physician did not need to go to the emergency department. Because of the duration of it just starting this morning she has not really used anything just yet.  Relevant past medical, surgical, family and social history reviewed and updated as indicated. Interim medical history since our last visit reviewed. Allergies and medications reviewed and updated.  Review of Systems  Constitutional: Negative for chills and fever.  HENT: Positive for congestion, postnasal drip, rhinorrhea, sinus pressure, sneezing, sore throat and voice change. Negative for ear discharge and ear pain.   Eyes: Negative for pain, redness and visual disturbance.  Respiratory: Positive for cough. Negative for chest tightness and shortness of breath.   Cardiovascular: Negative for chest pain and leg swelling.  Genitourinary: Negative for difficulty urinating and dysuria.  Musculoskeletal: Negative for back pain and gait problem.  Skin: Negative for rash.  Neurological: Negative for light-headedness and headaches.  Psychiatric/Behavioral: Negative for agitation and behavioral problems.   All other systems reviewed and are negative.   Per HPI unless specifically indicated above     Medication List       Accurate as of 06/21/16  1:10 PM. Always use your most recent med list.          acetaminophen 500 MG tablet Commonly known as:  TYLENOL Take 500 mg by mouth every 6 (six) hours as needed (pain).   fluticasone 50 MCG/ACT nasal spray Commonly known as:  FLONASE Place 1 spray into both nostrils 2 (two) times daily as needed for allergies or rhinitis.   levothyroxine 112 MCG tablet Commonly known as:  SYNTHROID, LEVOTHROID Take 112 mcg by mouth daily before breakfast.   levothyroxine 25 MCG tablet Commonly known as:  LEVOTHROID Take a 1/2 tab on Monday, Wednesday and Friday.   lisinopril 2.5 MG tablet Commonly known as:  PRINIVIL,ZESTRIL Take 2.5 mg by mouth daily.   multivitamin with minerals Tabs tablet Take 1 tablet by mouth daily.          Objective:    BP 119/67 (BP Location: Right Arm, Patient Position: Sitting, Cuff Size: Normal)   Pulse 92   Temp 99 F (37.2 C) (Oral)   Ht 5\' 3"  (1.6 m)   Wt 133 lb (60.3 kg)   BMI 23.56 kg/m   Wt Readings from Last 3 Encounters:  06/21/16 133 lb (60.3 kg)  06/14/16 134 lb (60.8 kg)  03/20/16 135 lb (61.2 kg)    Physical Exam  Constitutional: She is oriented to person, place, and time. She appears well-developed  and well-nourished. No distress.  HENT:  Right Ear: Tympanic membrane, external ear and ear canal normal.  Left Ear: Tympanic membrane, external ear and ear canal normal.  Nose: Mucosal edema and rhinorrhea present. No epistaxis. Right sinus exhibits no maxillary sinus tenderness and no frontal sinus tenderness. Left sinus exhibits no maxillary sinus tenderness and no frontal sinus tenderness.  Mouth/Throat: Uvula is midline and mucous membranes are normal. Posterior oropharyngeal edema and posterior oropharyngeal erythema present. No oropharyngeal exudate or tonsillar abscesses.  Eyes:  Conjunctivae and EOM are normal.  Cardiovascular: Normal rate, regular rhythm, normal heart sounds and intact distal pulses.   No murmur heard. Pulmonary/Chest: Effort normal and breath sounds normal. No respiratory distress. She has no wheezes.  Musculoskeletal: Normal range of motion. She exhibits no edema or tenderness.  Neurological: She is alert and oriented to person, place, and time. Coordination normal.  Skin: Skin is warm and dry. No rash noted. She is not diaphoretic.  Psychiatric: She has a normal mood and affect. Her behavior is normal.  Vitals reviewed.     Assessment & Plan:   Problem List Items Addressed This Visit    None    Visit Diagnoses    Laryngitis, acute    -  Primary   Relevant Medications   fluticasone (FLONASE) 50 MCG/ACT nasal spray       Follow up plan: Return if symptoms worsen or fail to improve.  Counseling provided for all of the vaccine components No orders of the defined types were placed in this encounter.   Caryl Pina, MD [facility] 06/21/2016, 1:10 PM

## 2016-06-29 ENCOUNTER — Other Ambulatory Visit: Payer: Self-pay | Admitting: Family Medicine

## 2016-06-29 ENCOUNTER — Other Ambulatory Visit: Payer: Self-pay | Admitting: Internal Medicine

## 2016-07-18 DIAGNOSIS — B349 Viral infection, unspecified: Secondary | ICD-10-CM | POA: Diagnosis not present

## 2016-07-18 DIAGNOSIS — R531 Weakness: Secondary | ICD-10-CM | POA: Diagnosis not present

## 2016-08-05 ENCOUNTER — Emergency Department (HOSPITAL_COMMUNITY)
Admission: EM | Admit: 2016-08-05 | Discharge: 2016-08-05 | Disposition: A | Discharge status: Home / Self Care | Payer: Medicare Other | Attending: Emergency Medicine | Admitting: Emergency Medicine

## 2016-08-05 ENCOUNTER — Emergency Department (HOSPITAL_COMMUNITY): Payer: Medicare Other

## 2016-08-05 ENCOUNTER — Encounter (HOSPITAL_COMMUNITY): Payer: Self-pay | Admitting: Emergency Medicine

## 2016-08-05 DIAGNOSIS — Z950 Presence of cardiac pacemaker: Secondary | ICD-10-CM | POA: Insufficient documentation

## 2016-08-05 DIAGNOSIS — R531 Weakness: Secondary | ICD-10-CM | POA: Diagnosis present

## 2016-08-05 DIAGNOSIS — I517 Cardiomegaly: Secondary | ICD-10-CM | POA: Diagnosis not present

## 2016-08-05 DIAGNOSIS — N3000 Acute cystitis without hematuria: Secondary | ICD-10-CM | POA: Diagnosis not present

## 2016-08-05 DIAGNOSIS — E039 Hypothyroidism, unspecified: Secondary | ICD-10-CM | POA: Insufficient documentation

## 2016-08-05 DIAGNOSIS — R5383 Other fatigue: Secondary | ICD-10-CM | POA: Diagnosis not present

## 2016-08-05 DIAGNOSIS — I10 Essential (primary) hypertension: Secondary | ICD-10-CM | POA: Diagnosis not present

## 2016-08-05 LAB — BASIC METABOLIC PANEL
ANION GAP: 6 (ref 5–15)
BUN: 15 mg/dL (ref 6–20)
CALCIUM: 10.2 mg/dL (ref 8.9–10.3)
CHLORIDE: 109 mmol/L (ref 101–111)
CO2: 27 mmol/L (ref 22–32)
CREATININE: 1 mg/dL (ref 0.44–1.00)
GFR calc Af Amer: 58 mL/min — ABNORMAL LOW (ref >60)
GFR calc non Af Amer: 50 mL/min — ABNORMAL LOW (ref >60)
GLUCOSE: 109 mg/dL — AB (ref 65–99)
Potassium: 5.1 mmol/L (ref 3.5–5.1)
Sodium: 142 mmol/L (ref 135–145)

## 2016-08-05 LAB — URINALYSIS, ROUTINE W REFLEX MICROSCOPIC
BILIRUBIN URINE: NEGATIVE
GLUCOSE, UA: NEGATIVE mg/dL
Ketones, ur: NEGATIVE mg/dL
Nitrite: NEGATIVE
PH: 6 (ref 5.0–8.0)
PROTEIN: NEGATIVE mg/dL
SPECIFIC GRAVITY, URINE: 1.01 (ref 1.005–1.030)

## 2016-08-05 LAB — CBC
HCT: 41 % (ref 36.0–46.0)
Hemoglobin: 13.4 g/dL (ref 12.0–15.0)
MCH: 31.8 pg (ref 26.0–34.0)
MCHC: 32.7 g/dL (ref 30.0–36.0)
MCV: 97.2 fL (ref 78.0–100.0)
Platelets: 267 10*3/uL (ref 150–400)
RBC: 4.22 MIL/uL (ref 3.87–5.11)
RDW: 15.2 % (ref 11.5–15.5)
WBC: 5.5 10*3/uL (ref 4.0–10.5)

## 2016-08-05 LAB — URINE MICROSCOPIC-ADD ON

## 2016-08-05 LAB — I-STAT CG4 LACTIC ACID, ED: LACTIC ACID, VENOUS: 1.12 mmol/L (ref 0.5–1.9)

## 2016-08-05 LAB — I-STAT TROPONIN, ED: Troponin i, poc: 0 ng/mL (ref 0.00–0.08)

## 2016-08-05 MED ORDER — FOSFOMYCIN TROMETHAMINE 3 G PO PACK
3.0000 g | PACK | Freq: Once | ORAL | Status: AC
  Administered 2016-08-05: 3 g via ORAL
  Filled 2016-08-05: qty 3

## 2016-08-05 MED ORDER — SODIUM CHLORIDE 0.9 % IV BOLUS (SEPSIS)
1000.0000 mL | Freq: Once | INTRAVENOUS | Status: AC
  Administered 2016-08-05: 1000 mL via INTRAVENOUS

## 2016-08-05 NOTE — ED Provider Notes (Signed)
[facility] DEPT Provider Note   CSN: [id number] Arrival date & time: 08/05/16  1314     History   Chief Complaint Chief Complaint  Patient presents with  . Weakness    HPI [name] is a 82 y.o. female With a past medical history significant or aortic insufficiency, Arrhythmias status post pacemaker, hypertension, and prior urinary tract infections Who presents with malaise, fatigue, and urinary frequency. Patient is accompanied by a friend. Patient says that starting yesterday, she has felt fatigued and "feeling bad all over". Patient specifically denies any fevers, chills, chest pain, shortness of breath, Cough, abdominal pain, nausea, vomiting, Constipation, diarrhea, dysuria, trauma. Patient says that she thinks she is eating well. She says this feels similar to when she had urinary tract infection in the past. She denies back tenderness.    Illness  This is a new problem. The current episode started yesterday. The problem occurs constantly. The problem has not changed since onset.Pertinent negatives include no chest pain, no abdominal pain, no headaches and no shortness of breath. Associated symptoms comments: Fatigue and frequency.. Nothing aggravates the symptoms. Nothing relieves the symptoms. She has tried nothing for the symptoms. The treatment provided no relief.    Past Medical History:  Diagnosis Date  . Aortic insufficiency   . Back pain   . Complete heart block ([place]) 08/31/2013  . Diverticulitis   . Hypertension   . Hypothyroid   . Liver lesion   . Lung nodule   . Mitral regurgitation 12/08/2014  . Rectal polyp   . Sigmoid diverticulitis 04/23/2013   History of March 2014   . 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 UTI (urinary tract infection)   . Villous adenoma of rectum 01/16/2013    Patient Active Problem List   Diagnosis Date Noted  . Varicose veins of leg with complications   . Bleeding from varicose vein 01/05/2015  . Acute  blood loss anemia 01/05/2015  . Stokes-Adams syncope   . Complete heart block ([place])   . Aortic insufficiency   . Hypertension   . Mitral regurgitation 12/08/2014  . Palpitations 11/11/2014  . SOB (shortness of breath) 11/11/2014  . Cardiac pacemaker in situ 07/30/2014  . Adenomatous polyp of rectum 07/30/2014  . Osteopenia of the elderly 03/25/2014  . Systolic ejection murmur Q000111Q  . History of pacemaker 09/04/2013  . Near syncope 08/29/2013  . Fall at home, with facial injury 08/29/2013  . UTI (lower urinary tract infection) 08/29/2013  . Sigmoid diverticulitis 04/23/2013  . Hypothyroidism 04/23/2013  . Chronic back pain 04/23/2013  . Villous adenoma of rectum 01/16/2013  . Mass of left lobe of liver 01/16/2013    Past Surgical History:  Procedure Laterality Date  . ABDOMINAL HYSTERECTOMY     still has ovaries  . FLEXIBLE SIGMOIDOSCOPY N/A 01/17/2013   Procedure: FLEXIBLE SIGMOIDOSCOPY;  Surgeon: [name], MD;  Location: [facility];  Service: Gastroenterology;  Laterality: N/A;  . INNER EAR SURGERY  1970's   due to infection  . PACEMAKER PLACEMENT  09/01/2013   [employer]  . PERMANENT PACEMAKER INSERTION N/A 09/01/2013   Procedure: PERMANENT PACEMAKER INSERTION;  Surgeon: [name], MD;  Location: [facility] CATH LAB;  Service: Cardiovascular;  Laterality: N/A;    OB History    No data available       Home Medications    Prior to Admission medications   Medication Sig Start Date End Date Taking? Authorizing Provider  acetaminophen (TYLENOL) 500 MG  tablet Take 500 mg by mouth every 6 (six) hours as needed (pain).     Historical Provider, MD  fluticasone (FLONASE) 50 MCG/ACT nasal spray Place 1 spray into both nostrils 2 (two) times daily as needed for allergies or rhinitis. 06/21/16   Fransisca Kaufmann Dettinger, MD  levothyroxine (LEVOTHROID) 25 MCG tablet Take a 1/2 tab on Monday, Wednesday and Friday. 03/17/16   Chipper Herb, MD  lisinopril (PRINIVIL,ZESTRIL) 2.5 MG  tablet Take 2.5 mg by mouth daily.    Historical Provider, MD  lisinopril (PRINIVIL,ZESTRIL) 2.5 MG tablet TAKE 1 TABLET DAILY 06/29/16   [name], MD  Multiple Vitamin (MULTIVITAMIN WITH MINERALS) TABS Take 1 tablet by mouth daily.    Historical Provider, MD  SYNTHROID 112 MCG tablet TAKE 1 TABLET DAILY 06/29/16   Chipper Herb, MD    Family History Family History  Problem Relation Age of Onset  . Heart disease Mother   . Heart disease Father   . Breast cancer Sister   . Cancer Sister     breast  . Diabetes Sister   . Heart disease Sister   . Colon cancer Neg Hx     Social History Social History  Substance Use Topics  . Smoking status: Never Smoker  . Smokeless tobacco: Never Used  . Alcohol use No     Allergies   Review of patient's allergies indicates no known allergies.   Review of Systems Review of Systems  Constitutional: Positive for fatigue. Negative for appetite change, chills, diaphoresis and fever.  HENT: Negative for congestion and sneezing.   Eyes: Negative for visual disturbance.  Respiratory: Negative for cough, chest tightness, shortness of breath, wheezing and stridor.   Cardiovascular: Negative for chest pain, palpitations and leg swelling.  Gastrointestinal: Negative for abdominal pain, constipation, diarrhea, nausea and vomiting.  Genitourinary: Positive for frequency. Negative for difficulty urinating, dysuria, enuresis, flank pain and hematuria.  Musculoskeletal: Negative for back pain, neck pain and neck stiffness.  Skin: Negative for rash and wound.  Neurological: Negative for weakness, light-headedness, numbness and headaches.  Psychiatric/Behavioral: Negative for agitation and confusion.  All other systems reviewed and are negative.    Physical Exam Updated Vital Signs BP 124/68   Pulse 76   Temp 97.8 F (36.6 C) (Oral)   Resp 19   Ht 5\' 5"  (1.651 m)   Wt 133 lb (60.3 kg)   SpO2 100%   BMI 22.13 kg/m   Physical Exam    Constitutional: She is oriented to person, place, and time. She appears well-developed and well-nourished. No distress.  HENT:  Head: Normocephalic and atraumatic.  Mouth/Throat: Oropharynx is clear and moist. No oropharyngeal exudate.  Eyes: Conjunctivae and EOM are normal. Pupils are equal, round, and reactive to light.  Neck: Normal range of motion. Neck supple.  Cardiovascular: Normal rate, regular rhythm and intact distal pulses.   Murmur heard. Pulmonary/Chest: Effort normal and breath sounds normal. No stridor. No respiratory distress. She has no wheezes. She has no rales. She exhibits no tenderness.  Abdominal: Soft. There is no tenderness.  Musculoskeletal: She exhibits no edema or tenderness.  Neurological: She is alert and oriented to person, place, and time. She has normal reflexes. She displays normal reflexes. No cranial nerve deficit. She exhibits normal muscle tone. Coordination normal.  Skin: Skin is warm and dry. Capillary refill takes less than 2 seconds. No rash noted. No erythema.  Psychiatric: She has a normal mood and affect.  Nursing note and vitals  reviewed.    ED Treatments / Results  Labs (all labs ordered are listed, but only abnormal results are displayed) Labs Reviewed  BASIC METABOLIC PANEL - Abnormal; Notable for the following:       Result Value   Glucose, Bld 109 (*)    GFR calc non Af Amer 50 (*)    GFR calc Af Amer 58 (*)    All other components within normal limits  URINALYSIS, ROUTINE W REFLEX MICROSCOPIC (NOT AT [facility]) - Abnormal; Notable for the following:    Hgb urine dipstick SMALL (*)    Leukocytes, UA LARGE (*)    All other components within normal limits  URINE MICROSCOPIC-ADD ON - Abnormal; Notable for the following:    Squamous Epithelial / LPF 6-30 (*)    Bacteria, UA RARE (*)    All other components within normal limits  CBC  CBG MONITORING, ED    EKG  EKG Interpretation None       Radiology Dg Chest 2 View  Result  Date: 08/05/2016 CLINICAL DATA:  Weakness, and malaise. EXAM: CHEST  2 VIEW COMPARISON:  Chest radiograph 09/02/2013. FINDINGS: Cardiomegaly. Dual lead pacer. Vascular calcification of the aortic arch. No active infiltrates or failure. No effusion or pneumothorax. Osteopenia. Similar appearance to priors. IMPRESSION: Cardiomegaly.  No active infiltrates or failure. Electronically Signed   By: Staci Righter M.D.   On: 08/05/2016 17:44    Procedures Procedures (including critical care time)  Medications Ordered in ED Medications  sodium chloride 0.9 % bolus 1,000 mL (0 mLs Intravenous Stopped 08/05/16 1737)  fosfomycin (MONUROL) packet 3 g (3 g Oral Given 08/05/16 1737)     Initial Impression / Assessment and Plan / ED Course  I have reviewed the triage vital signs and the nursing notes.  Pertinent labs & imaging results that were available during my care of the patient were reviewed by me and considered in my medical decision making (see chart for details).  Clinical Course   [name] is a 82 y.o. female With a past medical history significant or aortic insufficiency, Arrhythmias status post pacemaker, hypertension, and prior urinary tract infections Who presents with malaise, fatigue, and urinary frequency.  History and exam are seen above.  Given symptoms and description of prior episodes, suspect urinary tract infection as cause of malaise, frequency, and fatigue. Patient given fluids and lab testing ordered.   Lactic acid nonelevated, troponin negative, BMP unremarkable, CBC showed no anemia and no leukocytosis. Chest x-ray showed no evidence of pneumonia.  Given symptoms, urinalysis shows concern for UTI. Patient had large leukocytes and bacteria present. Patient also had small hemoglobin.   Patient informed the findings and treated with fosfomycin. Patient instructed to follow up with PCP for further management including management of hematuria. Patient reported feeling  better after fluid resuscitation. Patient had no more fatigue or malaise.  Patient given return precautions for any new or worsening symptoms. Patient and Friend understood follow instructions and return precautions. Patient discharged.   Final Clinical Impressions(s) / ED Diagnoses   Final diagnoses:  Other fatigue  Acute cystitis without hematuria    New Prescriptions Discharge Medication List as of 08/05/2016  5:52 PM      Clinical Impression: 1. Other fatigue   2. Acute cystitis without hematuria     Disposition: Discharge  Condition: Good  I have discussed the results, Dx and Tx plan with the pt(& family if present). He/she/they expressed understanding and agree(s) with the plan. Discharge  instructions discussed at great length. Strict return precautions discussed and pt &/or family have verbalized understanding of the instructions. No further questions at time of discharge.    Discharge Medication List as of 08/05/2016  5:52 PM      Follow Up: Chipper Herb, MD [place] [address] [phone]  Schedule an appointment as soon as possible for a visit    [facility] [id number] [place] [phone]  If symptoms worsen     Courtney Paris, MD 08/05/16 2220

## 2016-08-05 NOTE — ED Notes (Signed)
Pt to xray

## 2016-08-05 NOTE — ED Triage Notes (Signed)
Pt/daughter stated, I've been having moments of weakness and feeling not good. I was sent from [facility] UC.

## 2016-08-07 LAB — URINE CULTURE

## 2016-08-08 ENCOUNTER — Telehealth: Payer: Self-pay | Admitting: Family Medicine

## 2016-08-08 NOTE — Telephone Encounter (Signed)
appt scheduled for pt Offered earlier appt with another provider Pt declined appt

## 2016-08-16 ENCOUNTER — Ambulatory Visit (INDEPENDENT_AMBULATORY_CARE_PROVIDER_SITE_OTHER): Payer: Medicare Other | Admitting: Family Medicine

## 2016-08-16 ENCOUNTER — Encounter: Payer: Self-pay | Admitting: Family Medicine

## 2016-08-16 VITALS — BP 124/64 | HR 81 | Temp 97.8°F | Ht 65.0 in | Wt 132.4 lb

## 2016-08-16 DIAGNOSIS — R5383 Other fatigue: Secondary | ICD-10-CM | POA: Diagnosis not present

## 2016-08-16 DIAGNOSIS — R011 Cardiac murmur, unspecified: Secondary | ICD-10-CM

## 2016-08-16 DIAGNOSIS — F329 Major depressive disorder, single episode, unspecified: Secondary | ICD-10-CM | POA: Diagnosis not present

## 2016-08-16 DIAGNOSIS — I442 Atrioventricular block, complete: Secondary | ICD-10-CM | POA: Diagnosis not present

## 2016-08-16 DIAGNOSIS — N390 Urinary tract infection, site not specified: Secondary | ICD-10-CM | POA: Diagnosis not present

## 2016-08-16 DIAGNOSIS — Z950 Presence of cardiac pacemaker: Secondary | ICD-10-CM | POA: Diagnosis not present

## 2016-08-16 DIAGNOSIS — I340 Nonrheumatic mitral (valve) insufficiency: Secondary | ICD-10-CM

## 2016-08-16 DIAGNOSIS — E034 Atrophy of thyroid (acquired): Secondary | ICD-10-CM | POA: Diagnosis not present

## 2016-08-16 NOTE — Progress Notes (Signed)
Subjective:    Patient ID: [name], female    DOB: December 30, 1927, 82 y.o.   MRN: [id number]  HPI  Pt is here for a follow up from [facility] ED on 08/05/2016. Pt states "I just didn't feel right". Pt was found to have a UTI.The urinalysis had large leukocytes present. She did have a chest x-ray that showed no evidence of pneumonia. She was treated with fosfomycin. The patient comes today with her caregiver. She denies any chest pain or shortness of breath anymore than usual. She states she is able to do her regular activities around the home. She has no trouble with nausea vomiting diarrhea or blood in the stool. She is currently passing her water without problems. She is alert.  Patient Active Problem List   Diagnosis Date Noted  . Varicose veins of leg with complications   . Bleeding from varicose vein 01/05/2015  . Acute blood loss anemia 01/05/2015  . Stokes-Adams syncope   . Complete heart block ([place])   . Aortic insufficiency   . Hypertension   . Mitral regurgitation 12/08/2014  . Palpitations 11/11/2014  . SOB (shortness of breath) 11/11/2014  . Cardiac pacemaker in situ 07/30/2014  . Adenomatous polyp of rectum 07/30/2014  . Osteopenia of the elderly 03/25/2014  . Systolic ejection murmur Q000111Q  . History of pacemaker 09/04/2013  . Near syncope 08/29/2013  . Fall at home, with facial injury 08/29/2013  . UTI (lower urinary tract infection) 08/29/2013  . Sigmoid diverticulitis 04/23/2013  . Hypothyroidism 04/23/2013  . Chronic back pain 04/23/2013  . Villous adenoma of rectum 01/16/2013  . Mass of left lobe of liver 01/16/2013   Outpatient Encounter Prescriptions as of 08/16/2016  Medication Sig  . fluticasone (FLONASE) 50 MCG/ACT nasal spray Place 1 spray into both nostrils 2 (two) times daily as needed for allergies or rhinitis.  Marland Kitchen ibuprofen (ADVIL,MOTRIN) 200 MG tablet Take 200 mg by mouth every 6 (six) hours as needed for headache or moderate pain.  Marland Kitchen  levothyroxine (LEVOTHROID) 25 MCG tablet Take a 1/2 tab on Monday, Wednesday and Friday. (Patient taking differently: Take 12.5 mcg by mouth 3 (three) times a week. Monday, Wednesday and Friday.)  . lisinopril (PRINIVIL,ZESTRIL) 2.5 MG tablet TAKE 1 TABLET DAILY (Patient taking differently: Take 2.5 mg by mouth daily)  . Multiple Vitamin (MULTIVITAMIN WITH MINERALS) TABS Take 1 tablet by mouth daily.  Marland Kitchen SYNTHROID 112 MCG tablet TAKE 1 TABLET DAILY (Patient taking differently: Take 152mcg by mouth daily)  . Tetrahydrozoline HCl (EYE DROPS OP) Place 2 drops into both eyes as needed (for red or dry eyes). OTC Red eye relief   No facility-administered encounter medications on file as of 08/16/2016.        Review of Systems  Constitutional: Positive for fatigue.  HENT: Negative.   Cardiovascular: Negative.   Genitourinary: Negative.   Psychiatric/Behavioral: Positive for sleep disturbance (occasional).       Objective:   Physical Exam  Constitutional: She is oriented to person, place, and time. She appears well-developed and well-nourished. No distress.  Small framed but alert and pleasant  HENT:  Head: Normocephalic and atraumatic.  Right Ear: External ear normal.  Left Ear: External ear normal.  Nose: Nose normal.  Mouth/Throat: Oropharynx is clear and moist.  Eyes: Conjunctivae and EOM are normal. Pupils are equal, round, and reactive to light. Right eye exhibits no discharge. Left eye exhibits no discharge. No scleral icterus.  Neck: Normal range of motion. Neck supple. No  thyromegaly present.  No thyromegaly and no adenopathy  Cardiovascular: Normal rate, regular rhythm and intact distal pulses.  Exam reveals no gallop and no friction rub.   Murmur heard. Grade 4/6 systolic ejection murmur with a regular rate and rhythm at 72/m  Pulmonary/Chest: Effort normal and breath sounds normal. No respiratory distress. She has no wheezes. She has no rales. She exhibits no tenderness.    Abdominal: Soft. Bowel sounds are normal. She exhibits no mass. There is no tenderness. There is no rebound and no guarding.  No abdominal tenderness liver or spleen enlargement masses or bruits  Musculoskeletal: Normal range of motion. She exhibits no edema.  Lymphadenopathy:    She has no cervical adenopathy.  Neurological: She is alert and oriented to person, place, and time. No cranial nerve deficit.  Skin: Skin is warm and dry.  Psychiatric: She has a normal mood and affect. Her behavior is normal. Judgment and thought content normal.  Nursing note and vitals reviewed.  BP 124/64 (BP Location: Left Arm, Patient Position: Sitting, Cuff Size: Normal)   Pulse 81   Temp 97.8 F (36.6 C) (Oral)   Ht 5\' 5"  (1.651 m)   Wt 132 lb 6.4 oz (60.1 kg)   BMI 22.03 kg/m         Assessment & Plan:  1. Urinary tract infection without hematuria, site unspecified -The patient appears to be doing better in regard to this.  2. Cardiac pacemaker in situ -Follow-up with cardiology as planned  3. Fatigue due to depression -Check CBC BMP and thyroid profile  4. Systolic ejection murmur -Follow-up with cardiology as planned  5. Complete heart block ([facility]) -The patient has a pacemaker and her rhythm today is regular and her rate is good at 72.  6. Mitral valve insufficiency, unspecified etiology -Follow-up with cardiology as planned  7. Hypothyroidism due to acquired atrophy of thyroid -Check thyroid profile and continue with current thyroid dose pending results of lab work  Patient Instructions  Continue to drink plenty of fluids and stay well hydrated We'll call with the results of your lab work as soon as those results become available Follow-up with cardiology as planned We will call with lab work results as soon as these results become available  Arrie Senate MD

## 2016-08-16 NOTE — Patient Instructions (Addendum)
Continue to drink plenty of fluids and stay well hydrated We'll call with the results of your lab work as soon as those results become available Follow-up with cardiology as planned We will call with lab work results as soon as these results become available

## 2016-08-16 NOTE — Addendum Note (Signed)
Addended by: Marin Olp on: 08/16/2016 12:04 PM   Modules accepted: Orders

## 2016-08-17 LAB — CBC WITH DIFFERENTIAL/PLATELET
BASOS: 2 %
Basophils Absolute: 0.1 10*3/uL (ref 0.0–0.2)
EOS (ABSOLUTE): 0.2 10*3/uL (ref 0.0–0.4)
Eos: 3 %
Hematocrit: 39.4 % (ref 34.0–46.6)
Hemoglobin: 13.1 g/dL (ref 11.1–15.9)
IMMATURE GRANULOCYTES: 0 %
Immature Grans (Abs): 0 10*3/uL (ref 0.0–0.1)
LYMPHS ABS: 1.2 10*3/uL (ref 0.7–3.1)
Lymphs: 24 %
MCH: 31.2 pg (ref 26.6–33.0)
MCHC: 33.2 g/dL (ref 31.5–35.7)
MCV: 94 fL (ref 79–97)
MONOS ABS: 0.5 10*3/uL (ref 0.1–0.9)
Monocytes: 10 %
NEUTROS PCT: 61 %
Neutrophils Absolute: 3.1 10*3/uL (ref 1.4–7.0)
PLATELETS: 285 10*3/uL (ref 150–379)
RBC: 4.2 x10E6/uL (ref 3.77–5.28)
RDW: 15.1 % (ref 12.3–15.4)
WBC: 5 10*3/uL (ref 3.4–10.8)

## 2016-08-17 LAB — THYROID PANEL WITH TSH
Free Thyroxine Index: 2.9 (ref 1.2–4.9)
T3 UPTAKE RATIO: 29 % (ref 24–39)
T4, Total: 9.9 ug/dL (ref 4.5–12.0)
TSH: 0.457 u[IU]/mL (ref 0.450–4.500)

## 2016-08-17 LAB — BMP8+EGFR
BUN / CREAT RATIO: 18 (ref 12–28)
BUN: 16 mg/dL (ref 8–27)
CO2: 26 mmol/L (ref 18–29)
Calcium: 10.2 mg/dL (ref 8.7–10.3)
Chloride: 103 mmol/L (ref 96–106)
Creatinine, Ser: 0.87 mg/dL (ref 0.57–1.00)
GFR calc Af Amer: 70 mL/min/{1.73_m2} (ref >59)
GFR, EST NON AFRICAN AMERICAN: 61 mL/min/{1.73_m2} (ref >59)
GLUCOSE: 99 mg/dL (ref 65–99)
POTASSIUM: 4.7 mmol/L (ref 3.5–5.2)
SODIUM: 144 mmol/L (ref 134–144)

## 2016-08-17 LAB — PLEASE NOTE

## 2016-08-18 LAB — URINE CULTURE

## 2016-08-22 ENCOUNTER — Ambulatory Visit: Payer: Medicare Other | Admitting: Family Medicine

## 2016-09-13 ENCOUNTER — Ambulatory Visit (INDEPENDENT_AMBULATORY_CARE_PROVIDER_SITE_OTHER): Payer: Medicare Other | Admitting: *Deleted

## 2016-09-13 DIAGNOSIS — I442 Atrioventricular block, complete: Secondary | ICD-10-CM | POA: Diagnosis not present

## 2016-09-13 NOTE — Progress Notes (Signed)
Remote pacemaker transmission.   

## 2016-09-20 ENCOUNTER — Encounter: Payer: Self-pay | Admitting: Cardiology

## 2016-09-22 LAB — CUP PACEART REMOTE DEVICE CHECK
Battery Impedance: 161 Ohm
Battery Remaining Longevity: 133 mo
Battery Voltage: 2.79 V
Brady Statistic AP VP Percent: 11 %
Brady Statistic AS VP Percent: 23 %
Date Time Interrogation Session: 20171115150641
Implantable Lead Implant Date: 20141103
Implantable Lead Location: 753860
Implantable Lead Model: 5076
Implantable Pulse Generator Implant Date: 20141103
Lead Channel Impedance Value: 341 Ohm
Lead Channel Pacing Threshold Amplitude: 0.5 V
Lead Channel Pacing Threshold Pulse Width: 0.4 ms
Lead Channel Sensing Intrinsic Amplitude: 2.8 mV
Lead Channel Setting Pacing Amplitude: 2.5 V
Lead Channel Setting Pacing Pulse Width: 0.4 ms
MDC IDC LEAD IMPLANT DT: 20141103
MDC IDC LEAD LOCATION: 753859
MDC IDC MSMT LEADCHNL RV IMPEDANCE VALUE: 489 Ohm
MDC IDC MSMT LEADCHNL RV PACING THRESHOLD AMPLITUDE: 0.625 V
MDC IDC MSMT LEADCHNL RV PACING THRESHOLD PULSEWIDTH: 0.4 ms
MDC IDC MSMT LEADCHNL RV SENSING INTR AMPL: 5.6 mV
MDC IDC SET LEADCHNL RA PACING AMPLITUDE: 2 V
MDC IDC SET LEADCHNL RV SENSING SENSITIVITY: 2 mV
MDC IDC STAT BRADY AP VS PERCENT: 1 %
MDC IDC STAT BRADY AS VS PERCENT: 66 %

## 2016-10-04 ENCOUNTER — Other Ambulatory Visit: Payer: Medicare Other

## 2016-10-04 DIAGNOSIS — R300 Dysuria: Secondary | ICD-10-CM | POA: Diagnosis not present

## 2016-10-04 LAB — URINALYSIS, COMPLETE
Bilirubin, UA: NEGATIVE
GLUCOSE, UA: NEGATIVE
Ketones, UA: NEGATIVE
NITRITE UA: POSITIVE — AB
PH UA: 7 (ref 5.0–7.5)
Protein, UA: NEGATIVE
Specific Gravity, UA: 1.01 (ref 1.005–1.030)
Urobilinogen, Ur: 0.2 mg/dL (ref 0.2–1.0)

## 2016-10-04 LAB — MICROSCOPIC EXAMINATION
RENAL EPITHEL UA: NONE SEEN /HPF
WBC, UA: >30 /hpf — AB (ref 0–?)

## 2016-10-06 LAB — URINE CULTURE

## 2016-10-09 ENCOUNTER — Other Ambulatory Visit: Payer: Self-pay | Admitting: *Deleted

## 2016-10-09 MED ORDER — CIPROFLOXACIN HCL 500 MG PO TABS: 500.0000 mg | ORAL_TABLET | Freq: Two times a day (BID) | ORAL | 0 refills | Status: DC

## 2016-10-12 ENCOUNTER — Other Ambulatory Visit: Payer: Medicare Other

## 2016-10-12 ENCOUNTER — Telehealth: Payer: Self-pay | Admitting: *Deleted

## 2016-10-12 DIAGNOSIS — R300 Dysuria: Secondary | ICD-10-CM | POA: Diagnosis not present

## 2016-10-12 NOTE — Telephone Encounter (Signed)
Pt has some "red splotches" on her face today Recently started taking Cipro for UTI Denies rash or itching  Please advise

## 2016-10-12 NOTE — Telephone Encounter (Signed)
Does she have any itching or rash anywhere else.? How long has she been taking the Cipro? I would have her stop the Cipro and recheck a urinalysis at her convenience.

## 2016-10-13 LAB — MICROSCOPIC EXAMINATION

## 2016-10-13 LAB — URINALYSIS, COMPLETE
BILIRUBIN UA: NEGATIVE
GLUCOSE, UA: NEGATIVE
KETONES UA: NEGATIVE
NITRITE UA: NEGATIVE
PROTEIN UA: NEGATIVE
SPEC GRAV UA: 1.01 (ref 1.005–1.030)
Urobilinogen, Ur: 0.2 mg/dL (ref 0.2–1.0)
pH, UA: 5 (ref 5.0–7.5)

## 2016-10-13 LAB — URINE CULTURE

## 2016-11-06 ENCOUNTER — Telehealth: Payer: Self-pay | Admitting: Family Medicine

## 2016-11-06 NOTE — Telephone Encounter (Signed)
Pt called in with c/o of weakness and "funny feeling in her chest" Pt instructed to go to ED

## 2016-11-08 DIAGNOSIS — R0789 Other chest pain: Secondary | ICD-10-CM | POA: Diagnosis not present

## 2016-11-09 ENCOUNTER — Telehealth: Payer: Self-pay | Admitting: Family Medicine

## 2016-11-09 ENCOUNTER — Ambulatory Visit (INDEPENDENT_AMBULATORY_CARE_PROVIDER_SITE_OTHER): Payer: Medicare Other | Admitting: Family Medicine

## 2016-11-09 ENCOUNTER — Encounter: Payer: Self-pay | Admitting: Family Medicine

## 2016-11-09 VITALS — BP 122/58 | HR 75 | Temp 97.4°F | Ht 65.0 in | Wt 128.0 lb

## 2016-11-09 DIAGNOSIS — E034 Atrophy of thyroid (acquired): Secondary | ICD-10-CM | POA: Diagnosis not present

## 2016-11-09 DIAGNOSIS — R413 Other amnesia: Secondary | ICD-10-CM

## 2016-11-09 DIAGNOSIS — R350 Frequency of micturition: Secondary | ICD-10-CM | POA: Diagnosis not present

## 2016-11-09 DIAGNOSIS — Z950 Presence of cardiac pacemaker: Secondary | ICD-10-CM

## 2016-11-09 DIAGNOSIS — I340 Nonrheumatic mitral (valve) insufficiency: Secondary | ICD-10-CM

## 2016-11-09 DIAGNOSIS — R5383 Other fatigue: Secondary | ICD-10-CM

## 2016-11-09 NOTE — Patient Instructions (Signed)
We will ask the patient to follow-up with the cardiologist sooner rather than later because of the increased palpitations We will review her lab work and make sure that her thyroid medicines and her thyroid profile is appropriate as she has not been taking some for her medicines correctly recently according to the caregiver We asked her permission if we can talk to her son and she gives me permission to talk to her son about her future care issues. She will continue to drink plenty of fluids and stay active as physically possible.

## 2016-11-09 NOTE — Telephone Encounter (Signed)
Noted  

## 2016-11-09 NOTE — Progress Notes (Signed)
Subjective:    Patient ID: [name], female    DOB: 02/16/1928, 83 y.o.   MRN: [id number]  HPI Patient here today for fatigue and weakness and occasional heart irregularity. She is accompanied today by her caregiver.The patient has been complaining with some increased weakness and fatigue and palpitations at times. The caregiver/friend is concerned that her thyroid medicines may be messed up. She, the patient prepares her own medicines. Recently the EMS was called because of her chest feeling funny and she was having PVCs along with a right bundle branch block and the patient refused to go to the emergency room. The patient's biggest complaint today is weakness and fatigue. She had this recent episode where they had to call EMS in and she refused to go the hospital at that time. She did have some PVCs according to the caregiver. Today the patient is spry and alert admits to not remembering things as well but she says everyone at her age doesn't remember things as well. She denies any chest pain or shortness of breath. She did have a funny feeling in her chest the night that EMS came in. She denies any shortness of breath. She denies any trouble with her intestinal tract including nausea vomiting diarrhea blood in the stool or black tarry bowel movements. She has to go the bathroom frequently but there is no discomfort with passing her water.    Patient Active Problem List   Diagnosis Date Noted  . Varicose veins of leg with complications   . Bleeding from varicose vein 01/05/2015  . Acute blood loss anemia 01/05/2015  . Stokes-Adams syncope   . Complete heart block ([place])   . Aortic insufficiency   . Hypertension   . Mitral regurgitation 12/08/2014  . Palpitations 11/11/2014  . SOB (shortness of breath) 11/11/2014  . Cardiac pacemaker in situ 07/30/2014  . Adenomatous polyp of rectum 07/30/2014  . Osteopenia of the elderly 03/25/2014  . Systolic ejection murmur 94/80/1655  .  History of pacemaker 09/04/2013  . Near syncope 08/29/2013  . Fall at home, with facial injury 08/29/2013  . UTI (lower urinary tract infection) 08/29/2013  . Sigmoid diverticulitis 04/23/2013  . Hypothyroidism 04/23/2013  . Chronic back pain 04/23/2013  . Villous adenoma of rectum 01/16/2013  . Mass of left lobe of liver 01/16/2013   Outpatient Encounter Prescriptions as of 11/09/2016  Medication Sig  . fluticasone (FLONASE) 50 MCG/ACT nasal spray Place 1 spray into both nostrils 2 (two) times daily as needed for allergies or rhinitis.  Marland Kitchen ibuprofen (ADVIL,MOTRIN) 200 MG tablet Take 200 mg by mouth every 6 (six) hours as needed for headache or moderate pain.  Marland Kitchen levothyroxine (LEVOTHROID) 25 MCG tablet Take a 1/2 tab on Monday, Wednesday and Friday. (Patient taking differently: Take 12.5 mcg by mouth 3 (three) times a week. Monday, Wednesday and Friday.)  . lisinopril (PRINIVIL,ZESTRIL) 2.5 MG tablet TAKE 1 TABLET DAILY (Patient taking differently: Take 2.5 mg by mouth daily)  . Multiple Vitamin (MULTIVITAMIN WITH MINERALS) TABS Take 1 tablet by mouth daily.  Marland Kitchen SYNTHROID 112 MCG tablet TAKE 1 TABLET DAILY (Patient taking differently: Take 185mg by mouth daily)  . Tetrahydrozoline HCl (EYE DROPS OP) Place 2 drops into both eyes as needed (for red or dry eyes). OTC Red eye relief  . [DISCONTINUED] ciprofloxacin (CIPRO) 500 MG tablet Take 1 tablet (500 mg total) by mouth 2 (two) times daily.   No facility-administered encounter medications on file as of 11/09/2016.  Review of Systems  Constitutional: Positive for fatigue.  HENT: Negative.   Eyes: Negative.   Respiratory: Negative.   Cardiovascular: Positive for palpitations (occasional).  Gastrointestinal: Negative.   Endocrine: Negative.   Genitourinary: Negative.   Musculoskeletal: Negative.   Skin: Negative.   Allergic/Immunologic: Negative.   Neurological: Positive for weakness.  Hematological: Negative.     Psychiatric/Behavioral: Negative.        Objective:   Physical Exam  Constitutional: She is oriented to person, place, and time. She appears well-developed and well-nourished. No distress.  HENT:  Head: Normocephalic and atraumatic.  Right Ear: External ear normal.  Left Ear: External ear normal.  Nose: Nose normal.  Mouth/Throat: Oropharynx is clear and moist.  Eyes: Conjunctivae and EOM are normal. Pupils are equal, round, and reactive to light. Right eye exhibits no discharge. Left eye exhibits no discharge. No scleral icterus.  Neck: Normal range of motion. Neck supple. No thyromegaly present.  No thyromegaly or anterior cervical adenopathy  Cardiovascular: Normal rate and regular rhythm.   No murmur heard. The heart was regular today and EKG was done for confirmation of any abnormal changes which were not present.  Pulmonary/Chest: Effort normal and breath sounds normal. No respiratory distress. She has no wheezes. She has no rales.  Clear anteriorly and posteriorly  Abdominal: Soft. Bowel sounds are normal. She exhibits no mass. There is no tenderness. There is no rebound and no guarding.  Musculoskeletal: Normal range of motion. She exhibits no edema.  Lymphadenopathy:    She has no cervical adenopathy.  Neurological: She is alert and oriented to person, place, and time.  Skin: Skin is warm and dry. No rash noted.  Psychiatric: She has a normal mood and affect. Her behavior is normal. Judgment and thought content normal.  Nursing note and vitals reviewed.  BP (!) 122/58 (BP Location: Right Arm)   Pulse 75   Temp 97.4 F (36.3 C) (Oral)   Ht 5' 5"  (1.651 m)   Wt 128 lb (58.1 kg)   BMI 21.30 kg/m         Assessment & Plan:  1. Other fatigue -The patient may have not been taking her thyroid medicine appropriately. She is only taking 1 pill daily and she is supposed to be taking an additional half of a pill. We will not start any additional half of a pill back until  this information is returned - CBC with Differential/Platelet - BMP8+EGFR - Hepatic function panel - Thyroid Panel With TSH - EKG 12-Lead - Urinalysis, Complete  2. Hypothyroidism due to acquired atrophy of thyroid -Continue 112 g daily until lab work is returned  3. Mitral valve insufficiency, unspecified etiology -Follow-up with cardiology as planned  4. Cardiac pacemaker in situ -Follow-up with cardiology as planned  5. Urinary frequency -Check urinalysis and treat appropriately  6. Memory impairment -The patient admits to some memory impairment. She did give me permission to talk with her son regarding possible placement in an assisted living facility.  Patient Instructions  We will ask the patient to follow-up with the cardiologist sooner rather than later because of the increased palpitations We will review her lab work and make sure that her thyroid medicines and her thyroid profile is appropriate as she has not been taking some for her medicines correctly recently according to the caregiver We asked her permission if we can talk to her son and she gives me permission to talk to her son about her future care issues. She will  continue to drink plenty of fluids and stay active as physically possible.  Arrie Senate MD

## 2016-11-09 NOTE — Addendum Note (Signed)
Addended by: Zannie Cove on: 11/09/2016 03:17 PM   Modules accepted: Orders

## 2016-11-10 ENCOUNTER — Other Ambulatory Visit: Payer: Medicare Other

## 2016-11-10 DIAGNOSIS — R5383 Other fatigue: Secondary | ICD-10-CM | POA: Diagnosis not present

## 2016-11-10 DIAGNOSIS — R300 Dysuria: Secondary | ICD-10-CM | POA: Diagnosis not present

## 2016-11-10 LAB — URINALYSIS, COMPLETE
BILIRUBIN UA: NEGATIVE
BILIRUBIN UA: NEGATIVE
Glucose, UA: NEGATIVE
Glucose, UA: NEGATIVE
KETONES UA: NEGATIVE
Ketones, UA: NEGATIVE
NITRITE UA: NEGATIVE
Nitrite, UA: NEGATIVE
PH UA: 5.5 (ref 5.0–7.5)
PH UA: 5.5 (ref 5.0–7.5)
PROTEIN UA: NEGATIVE
Protein, UA: NEGATIVE
Specific Gravity, UA: <1.005 — ABNORMAL LOW (ref 1.005–1.030)
Urobilinogen, Ur: 0.2 mg/dL (ref 0.2–1.0)
Urobilinogen, Ur: 0.2 mg/dL (ref 0.2–1.0)

## 2016-11-10 LAB — CBC WITH DIFFERENTIAL/PLATELET
Basophils Absolute: 0.1 10*3/uL (ref 0.0–0.2)
Basos: 1 %
EOS (ABSOLUTE): 0.2 10*3/uL (ref 0.0–0.4)
EOS: 3 %
HEMATOCRIT: 41.1 % (ref 34.0–46.6)
HEMOGLOBIN: 13.2 g/dL (ref 11.1–15.9)
IMMATURE GRANS (ABS): 0 10*3/uL (ref 0.0–0.1)
Immature Granulocytes: 0 %
LYMPHS ABS: 1.4 10*3/uL (ref 0.7–3.1)
LYMPHS: 20 %
MCH: 30.6 pg (ref 26.6–33.0)
MCHC: 32.1 g/dL (ref 31.5–35.7)
MCV: 95 fL (ref 79–97)
MONOCYTES: 9 %
Monocytes Absolute: 0.6 10*3/uL (ref 0.1–0.9)
NEUTROS ABS: 4.7 10*3/uL (ref 1.4–7.0)
Neutrophils: 67 %
Platelets: 274 10*3/uL (ref 150–379)
RBC: 4.31 x10E6/uL (ref 3.77–5.28)
RDW: 14.8 % (ref 12.3–15.4)
WBC: 6.9 10*3/uL (ref 3.4–10.8)

## 2016-11-10 LAB — THYROID PANEL WITH TSH
Free Thyroxine Index: 3.2 (ref 1.2–4.9)
T3 Uptake Ratio: 31 % (ref 24–39)
T4 TOTAL: 10.3 ug/dL (ref 4.5–12.0)
TSH: 0.128 u[IU]/mL — ABNORMAL LOW (ref 0.450–4.500)

## 2016-11-10 LAB — HEPATIC FUNCTION PANEL
ALBUMIN: 4.5 g/dL (ref 3.5–4.7)
ALK PHOS: 66 IU/L (ref 39–117)
ALT: 12 IU/L (ref 0–32)
AST: 20 IU/L (ref 0–40)
BILIRUBIN, DIRECT: 0.14 mg/dL (ref 0.00–0.40)
Bilirubin Total: 0.5 mg/dL (ref 0.0–1.2)
TOTAL PROTEIN: 6.6 g/dL (ref 6.0–8.5)

## 2016-11-10 LAB — BMP8+EGFR
BUN/Creatinine Ratio: 23 (ref 12–28)
BUN: 22 mg/dL (ref 8–27)
CO2: 27 mmol/L (ref 18–29)
CREATININE: 0.94 mg/dL (ref 0.57–1.00)
Calcium: 9.8 mg/dL (ref 8.7–10.3)
Chloride: 100 mmol/L (ref 96–106)
GFR calc Af Amer: 64 mL/min/{1.73_m2} (ref >59)
GFR, EST NON AFRICAN AMERICAN: 55 mL/min/{1.73_m2} — AB (ref >59)
GLUCOSE: 87 mg/dL (ref 65–99)
Potassium: 4.4 mmol/L (ref 3.5–5.2)
SODIUM: 141 mmol/L (ref 134–144)

## 2016-11-10 LAB — MICROSCOPIC EXAMINATION
RENAL EPITHEL UA: NONE SEEN /HPF
WBC, UA: >30 /hpf — AB (ref 0–?)

## 2016-11-11 LAB — URINE CULTURE

## 2016-11-13 NOTE — Progress Notes (Signed)
Patient aware.

## 2016-11-20 ENCOUNTER — Telehealth: Payer: Self-pay | Admitting: Family Medicine

## 2016-11-20 NOTE — Telephone Encounter (Signed)
The patient should not need to see me unless we need to repeat another thyroid panel because of the confusion that she had with taking her thyroid medications. Please follow recommendations from last lab no results.

## 2016-11-20 NOTE — Telephone Encounter (Signed)
Advice for what?

## 2016-11-21 NOTE — Telephone Encounter (Signed)
This is just an FYI of up coming appt's == the only thing here is for her thyroid lab repeat

## 2016-12-08 ENCOUNTER — Other Ambulatory Visit: Payer: Medicare Other

## 2016-12-08 DIAGNOSIS — E039 Hypothyroidism, unspecified: Secondary | ICD-10-CM | POA: Diagnosis not present

## 2016-12-09 LAB — THYROID PANEL WITH TSH
FREE THYROXINE INDEX: 3.3 (ref 1.2–4.9)
T3 Uptake Ratio: 32 % (ref 24–39)
T4, Total: 10.2 ug/dL (ref 4.5–12.0)
TSH: 0.175 u[IU]/mL — ABNORMAL LOW (ref 0.450–4.500)

## 2016-12-11 ENCOUNTER — Other Ambulatory Visit: Payer: Self-pay | Admitting: *Deleted

## 2016-12-11 MED ORDER — LEVOTHYROXINE SODIUM 100 MCG PO TABS: 100.0000 ug | ORAL_TABLET | Freq: Every day | ORAL | 1 refills | Status: DC

## 2016-12-13 ENCOUNTER — Encounter: Payer: Medicare Other | Admitting: *Deleted

## 2016-12-13 ENCOUNTER — Telehealth: Payer: Self-pay | Admitting: Cardiology

## 2016-12-13 NOTE — Telephone Encounter (Signed)
Attempted to confirm remote transmission with pt. No answer and was unable to leave a message.   

## 2016-12-14 ENCOUNTER — Encounter: Payer: Self-pay | Admitting: Cardiology

## 2016-12-15 ENCOUNTER — Other Ambulatory Visit: Payer: Self-pay

## 2016-12-15 ENCOUNTER — Ambulatory Visit (HOSPITAL_COMMUNITY): Payer: Medicare Other | Attending: Cardiology

## 2016-12-15 DIAGNOSIS — I340 Nonrheumatic mitral (valve) insufficiency: Secondary | ICD-10-CM | POA: Insufficient documentation

## 2016-12-18 ENCOUNTER — Ambulatory Visit (INDEPENDENT_AMBULATORY_CARE_PROVIDER_SITE_OTHER): Payer: Medicare Other | Admitting: Internal Medicine

## 2016-12-18 ENCOUNTER — Encounter: Payer: Self-pay | Admitting: Internal Medicine

## 2016-12-18 VITALS — BP 130/58 | HR 79 | Ht 65.0 in | Wt 127.4 lb

## 2016-12-18 DIAGNOSIS — I340 Nonrheumatic mitral (valve) insufficiency: Secondary | ICD-10-CM | POA: Diagnosis not present

## 2016-12-18 DIAGNOSIS — I442 Atrioventricular block, complete: Secondary | ICD-10-CM

## 2016-12-18 LAB — CUP PACEART INCLINIC DEVICE CHECK
Battery Remaining Longevity: 122 mo
Battery Voltage: 2.79 V
Brady Statistic AP VS Percent: 1 %
Brady Statistic AS VS Percent: 42 %
Implantable Lead Implant Date: 20141103
Implantable Lead Location: 753859
Implantable Lead Location: 753860
Lead Channel Impedance Value: 345 Ohm
Lead Channel Impedance Value: 498 Ohm
Lead Channel Pacing Threshold Amplitude: 0.5 V
Lead Channel Pacing Threshold Pulse Width: 0.4 ms
Lead Channel Sensing Intrinsic Amplitude: 8 mV
Lead Channel Setting Pacing Amplitude: 2 V
Lead Channel Setting Pacing Amplitude: 2.5 V
Lead Channel Setting Sensing Sensitivity: 2 mV
MDC IDC LEAD IMPLANT DT: 20141103
MDC IDC MSMT BATTERY IMPEDANCE: 185 Ohm
MDC IDC MSMT LEADCHNL RA PACING THRESHOLD AMPLITUDE: 0.5 V
MDC IDC MSMT LEADCHNL RA PACING THRESHOLD PULSEWIDTH: 0.4 ms
MDC IDC MSMT LEADCHNL RA SENSING INTR AMPL: 2.8 mV
MDC IDC PG IMPLANT DT: 20141103
MDC IDC SESS DTM: 20180219135116
MDC IDC SET LEADCHNL RV PACING PULSEWIDTH: 0.4 ms
MDC IDC STAT BRADY AP VP PERCENT: 15 %
MDC IDC STAT BRADY AS VP PERCENT: 43 %

## 2016-12-18 NOTE — Progress Notes (Signed)
HPI [name] returns today for followup. She is a pleasant 83 yo woman with CHB, s/p PPM placed just over 3 years ago. In the interim, she had been fairly stable despite her mitral regurgitation. She saw Dr. Roxy Diaz who recommended watchful waiting.  She has preserved left ventricular systolic function by echo previously. She has not had syncope, and has class II symptoms. She notes that she has days where she feels weak and tired but no chest pain or sob or edema. She does not have atrial fib.  No Known Allergies   Current Outpatient Prescriptions  Medication Sig Dispense Refill  . ibuprofen (ADVIL,MOTRIN) 200 MG tablet Take 200 mg by mouth every 6 (six) hours as needed for headache or moderate pain.    Marland Kitchen levothyroxine (SYNTHROID, LEVOTHROID) 100 MCG tablet Take 1 tablet (100 mcg total) by mouth daily. 90 tablet 1  . lisinopril (PRINIVIL,ZESTRIL) 2.5 MG tablet Take 2.5 mg by mouth daily.    . Multiple Vitamin (MULTIVITAMIN WITH MINERALS) TABS Take 1 tablet by mouth daily.    . Tetrahydrozoline HCl (EYE DROPS OP) Place 2 drops into both eyes as needed (for red or dry eyes). OTC Red eye relief - Use as directed     No current facility-administered medications for this visit.      Past Medical History:  Diagnosis Date  . Aortic insufficiency   . Back pain   . Complete heart block ([name]) 08/31/2013  . Diverticulitis   . Hypertension   . Hypothyroid   . Liver lesion   . Lung nodule   . Mitral regurgitation 12/08/2014  . Rectal polyp   . Sigmoid diverticulitis 04/23/2013   History of March 2014   . 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 UTI (urinary tract infection)   . Villous adenoma of rectum 01/16/2013    ROS:   All systems reviewed and negative except as noted in the HPI.   Past Surgical History:  Procedure Laterality Date  . ABDOMINAL HYSTERECTOMY     still has ovaries  . FLEXIBLE SIGMOIDOSCOPY N/A 01/17/2013   Procedure: FLEXIBLE SIGMOIDOSCOPY;   Surgeon: Shelly Bears, MD;  [facility]: [name];  Service: Gastroenterology;  Laterality: N/A;  . INNER EAR SURGERY  1970's   due to infection  . PACEMAKER PLACEMENT  09/01/2013   Medtronic  . PERMANENT PACEMAKER INSERTION N/A 09/01/2013   Procedure: PERMANENT PACEMAKER INSERTION;  Surgeon: Shelly Lance, MD;  Location: [name];  Service: Cardiovascular;  Laterality: N/A;     Family History  Problem Relation Age of Onset  . Heart disease Mother   . Heart disease Father   . Breast cancer Sister   . Cancer Sister     breast  . Diabetes Sister   . Heart disease Sister   . Colon cancer Neg Hx      Social History   Social History  . Marital status: Widowed    Spouse name: N/A  . Number of children: 1  . Years of education: N/A   Occupational History  . retired    Social History Main Topics  . Smoking status: Never Smoker  . Smokeless tobacco: Never Used  . Alcohol use No  . Drug use: No  . Sexual activity: Not on file   Other Topics Concern  . Not on file   Social History Narrative  . No narrative on file     BP (!) 130/58   Pulse 79  Ht 5\' 5"  (1.651 m)   Wt 127 lb 6.4 oz (57.8 kg)   BMI 21.20 kg/m   Physical Exam:  Well appearing elderly woman, NAD HEENT: Unremarkable Neck:  6 cm JVD, no thyromegally Back:  No CVA tenderness Lungs:  Clear with no wheezes, well-healed pacemaker incision. HEART:  Regular rate rhythm, 1/6 systolic murmurs, no rubs, no clicks Abd:  soft, positive bowel sounds, no organomegally, no rebound, no guarding Ext:  2 plus pulses, no edema, no cyanosis, no clubbing Skin:  No rashes no nodules Neuro:  CN II through XII intact, motor grossly intact  DEVICE  Normal device function.  See PaceArt for details.   Assess/Plan: 1. 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 2. Medtr[employer] DDD PM - her device has been interogated today and is working normally. Will follow. 3. Mitral regurgitation - she  has severe MR by echo in the setting of normal LV function and minimal symptoms. At her advanced age and lack of symptoms, she will undergo watchful waiting 4. HTN - her blood pressure is well controlled on low dose lisinopril.  [name].D.

## 2016-12-18 NOTE — Patient Instructions (Addendum)
Medication Instructions:  Your physician recommends that you continue on your current medications as directed. Please refer to the Current Medication list given to you today.   Labwork: None Ordered   Testing/Procedures: None Ordered   Follow-Up: Your physician wants you to follow-up in: 1 year with Dr. Lovena Le. You will receive a reminder letter in the mail two months in advance. If you don't receive a letter, please call our office to schedule the follow-up appointment.  Remote monitoring is used to monitor your Pacemaker from home. This monitoring reduces the number of office visits required to check your device to one time per year. It allows [place] to keep an eye on the functioning of your device to ensure it is working properly. You are scheduled for a device check from home on 03/19/17. You may send your transmission at any time that day. If you have a wireless device, the transmission will be sent automatically. After your physician reviews your transmission, you will receive a postcard with your next transmission date.    Any Other Special Instructions Will Be Listed Below (If Applicable).     If you need a refill on your cardiac medications before your next appointment, please call your pharmacy.

## 2016-12-20 ENCOUNTER — Other Ambulatory Visit: Payer: Self-pay | Admitting: *Deleted

## 2016-12-20 MED ORDER — LISINOPRIL 2.5 MG PO TABS: 2.5000 mg | ORAL_TABLET | Freq: Every day | ORAL | 0 refills | Status: DC

## 2017-01-15 ENCOUNTER — Telehealth: Payer: Self-pay | Admitting: Family Medicine

## 2017-01-15 NOTE — Telephone Encounter (Signed)
They are aware that patient is to be on 143mcg of levothyroxine.

## 2017-02-14 ENCOUNTER — Ambulatory Visit: Payer: Medicare Other | Admitting: Family Medicine

## 2017-02-19 ENCOUNTER — Ambulatory Visit: Payer: Medicare Other | Admitting: Family Medicine

## 2017-03-19 ENCOUNTER — Ambulatory Visit: Payer: Medicare Other | Admitting: Thoracic Surgery (Cardiothoracic Vascular Surgery)

## 2017-03-19 ENCOUNTER — Ambulatory Visit: Payer: Medicare Other | Admitting: Family

## 2017-03-19 ENCOUNTER — Ambulatory Visit (INDEPENDENT_AMBULATORY_CARE_PROVIDER_SITE_OTHER): Payer: Medicare Other | Admitting: *Deleted

## 2017-03-19 ENCOUNTER — Ambulatory Visit: Payer: Medicare Other | Admitting: Pediatrics

## 2017-03-19 DIAGNOSIS — I442 Atrioventricular block, complete: Secondary | ICD-10-CM | POA: Diagnosis not present

## 2017-03-19 DIAGNOSIS — M549 Dorsalgia, unspecified: Secondary | ICD-10-CM | POA: Diagnosis not present

## 2017-03-21 NOTE — Progress Notes (Signed)
Remote pacemaker transmission.   

## 2017-03-27 LAB — CUP PACEART REMOTE DEVICE CHECK
Battery Remaining Longevity: 113 mo
Brady Statistic AP VP Percent: 17 %
Brady Statistic AP VS Percent: 0 %
Brady Statistic AS VP Percent: 83 %
Brady Statistic AS VS Percent: 0 %
Date Time Interrogation Session: 20180523142148
Implantable Lead Implant Date: 20141103
Implantable Lead Location: 753859
Implantable Lead Location: 753860
Implantable Lead Model: 5076
Implantable Pulse Generator Implant Date: 20141103
Lead Channel Impedance Value: 499 Ohm
Lead Channel Pacing Threshold Amplitude: 0.5 V
Lead Channel Pacing Threshold Amplitude: 0.625 V
Lead Channel Sensing Intrinsic Amplitude: 2.8 mV
Lead Channel Setting Pacing Amplitude: 2.5 V
Lead Channel Setting Pacing Pulse Width: 0.4 ms
MDC IDC LEAD IMPLANT DT: 20141103
MDC IDC MSMT BATTERY IMPEDANCE: 185 Ohm
MDC IDC MSMT BATTERY VOLTAGE: 2.79 V
MDC IDC MSMT LEADCHNL RA IMPEDANCE VALUE: 367 Ohm
MDC IDC MSMT LEADCHNL RA PACING THRESHOLD PULSEWIDTH: 0.4 ms
MDC IDC MSMT LEADCHNL RV PACING THRESHOLD PULSEWIDTH: 0.4 ms
MDC IDC SET LEADCHNL RA PACING AMPLITUDE: 2 V
MDC IDC SET LEADCHNL RV SENSING SENSITIVITY: 2 mV

## 2017-03-28 ENCOUNTER — Ambulatory Visit: Payer: Medicare Other | Admitting: Family Medicine

## 2017-03-31 DIAGNOSIS — Z95810 Presence of automatic (implantable) cardiac defibrillator: Secondary | ICD-10-CM | POA: Diagnosis not present

## 2017-03-31 DIAGNOSIS — M5489 Other dorsalgia: Secondary | ICD-10-CM | POA: Diagnosis not present

## 2017-03-31 DIAGNOSIS — Z79899 Other long term (current) drug therapy: Secondary | ICD-10-CM | POA: Diagnosis not present

## 2017-03-31 DIAGNOSIS — M25559 Pain in unspecified hip: Secondary | ICD-10-CM | POA: Diagnosis not present

## 2017-03-31 DIAGNOSIS — M5441 Lumbago with sciatica, right side: Secondary | ICD-10-CM | POA: Diagnosis not present

## 2017-03-31 DIAGNOSIS — M5431 Sciatica, right side: Secondary | ICD-10-CM | POA: Diagnosis not present

## 2017-03-31 DIAGNOSIS — I10 Essential (primary) hypertension: Secondary | ICD-10-CM | POA: Diagnosis not present

## 2017-03-31 DIAGNOSIS — E039 Hypothyroidism, unspecified: Secondary | ICD-10-CM | POA: Diagnosis not present

## 2017-03-31 DIAGNOSIS — R319 Hematuria, unspecified: Secondary | ICD-10-CM | POA: Diagnosis not present

## 2017-04-02 DIAGNOSIS — R69 Illness, unspecified: Secondary | ICD-10-CM | POA: Diagnosis not present

## 2017-04-02 DIAGNOSIS — R5381 Other malaise: Secondary | ICD-10-CM | POA: Diagnosis not present

## 2017-04-13 ENCOUNTER — Encounter: Payer: Self-pay | Admitting: Family Medicine

## 2017-04-13 ENCOUNTER — Ambulatory Visit (INDEPENDENT_AMBULATORY_CARE_PROVIDER_SITE_OTHER): Payer: Medicare Other | Admitting: Family Medicine

## 2017-04-13 ENCOUNTER — Ambulatory Visit (INDEPENDENT_AMBULATORY_CARE_PROVIDER_SITE_OTHER): Payer: Medicare Other

## 2017-04-13 VITALS — BP 111/55 | HR 85 | Temp 98.1°F | Ht 65.0 in | Wt 115.0 lb

## 2017-04-13 DIAGNOSIS — R634 Abnormal weight loss: Secondary | ICD-10-CM | POA: Diagnosis not present

## 2017-04-13 DIAGNOSIS — I10 Essential (primary) hypertension: Secondary | ICD-10-CM

## 2017-04-13 DIAGNOSIS — R413 Other amnesia: Secondary | ICD-10-CM | POA: Diagnosis not present

## 2017-04-13 DIAGNOSIS — R451 Restlessness and agitation: Secondary | ICD-10-CM

## 2017-04-13 DIAGNOSIS — I700 Atherosclerosis of aorta: Secondary | ICD-10-CM | POA: Insufficient documentation

## 2017-04-13 DIAGNOSIS — M25551 Pain in right hip: Secondary | ICD-10-CM | POA: Diagnosis not present

## 2017-04-13 DIAGNOSIS — R54 Age-related physical debility: Secondary | ICD-10-CM | POA: Diagnosis not present

## 2017-04-13 DIAGNOSIS — E034 Atrophy of thyroid (acquired): Secondary | ICD-10-CM | POA: Diagnosis not present

## 2017-04-13 DIAGNOSIS — E559 Vitamin D deficiency, unspecified: Secondary | ICD-10-CM | POA: Diagnosis not present

## 2017-04-13 NOTE — Progress Notes (Signed)
Subjective:    Patient ID: [name], female    DOB: 1928/08/20, 83 y.o.   MRN: [id number]  HPI Pt here for follow up and management of chronic medical problems which includes hypertension and hypothyroid. She is taking medication regularly.The patient comes to the visit today with her friend and caregiver. Caregiver had written me a long 2 page letter discussing things are going on at home and her concern for Mrs. [place]. Mrs. Stark does not know this letter was given to me. Generally speaking the patient is not eating properly she is afraid to drive because of ongoing hip pain and she's not taking her medications correctly. She is somewhat agitated with everyone but does seem to get along well with her friend and caregiver. We had a long discussion about the need for her to be someplace close by that someone could assist her with her activities of daily living her medicines and providing her with food. We discussed how she could have privacy but still have a social life to meet people and talk with people and be with people and that her friend/caregiver could still come and assist her as needed in the assisted living facility. We talked for a long time about this she was somewhat agitated at times and she can go do it herself and collect information but quite frankly I told her that she needed someone to go with her. We called and they were willing to talk to her and see her today. The patient denies any chest pain or shortness of breath. She denies any trouble with nausea vomiting diarrhea blood in the stool or black tarry bowel movements. She denies any trouble with passing her water. Since her last visit she has lost 12 pounds and this is since February.     Patient Active Problem List   Diagnosis Date Noted  . Varicose veins of leg with complications   . Bleeding from varicose vein 01/05/2015  . Acute blood loss anemia 01/05/2015  . Stokes-Adams syncope   . Complete heart block  ([place])   . Aortic insufficiency   . Hypertension   . Mitral regurgitation 12/08/2014  . Palpitations 11/11/2014  . SOB (shortness of breath) 11/11/2014  . Cardiac pacemaker in situ 07/30/2014  . Adenomatous polyp of rectum 07/30/2014  . Osteopenia of the elderly 03/25/2014  . Systolic ejection murmur 04/22/7627  . History of pacemaker 09/04/2013  . Near syncope 08/29/2013  . Fall at home, with facial injury 08/29/2013  . UTI (lower urinary tract infection) 08/29/2013  . Sigmoid diverticulitis 04/23/2013  . Hypothyroidism 04/23/2013  . Chronic back pain 04/23/2013  . Villous adenoma of rectum 01/16/2013  . Mass of left lobe of liver 01/16/2013   Outpatient Encounter Prescriptions as of 04/13/2017  Medication Sig  . ibuprofen (ADVIL,MOTRIN) 200 MG tablet Take 200 mg by mouth every 6 (six) hours as needed for headache or moderate pain.  Marland Kitchen levothyroxine (SYNTHROID, LEVOTHROID) 100 MCG tablet Take 1 tablet (100 mcg total) by mouth daily.  Marland Kitchen lisinopril (PRINIVIL,ZESTRIL) 2.5 MG tablet Take 1 tablet (2.5 mg total) by mouth daily.  . Multiple Vitamin (MULTIVITAMIN WITH MINERALS) TABS Take 1 tablet by mouth daily.  . Tetrahydrozoline HCl (EYE DROPS OP) Place 2 drops into both eyes as needed (for red or dry eyes). OTC Red eye relief - Use as directed   No facility-administered encounter medications on file as of 04/13/2017.       Review of Systems  HENT: Negative.  Eyes: Negative.   Respiratory: Negative.   Cardiovascular: Negative.   Gastrointestinal: Negative.   Endocrine: Negative.   Genitourinary: Negative.   Musculoskeletal: Positive for arthralgias (right hip pain).  Skin: Negative.   Allergic/Immunologic: Negative.   Neurological: Positive for weakness.  Hematological: Negative.   Psychiatric/Behavioral: Negative.        Objective:   Physical Exam  Constitutional: She is oriented to person, place, and time. She appears well-developed and well-nourished. She appears  distressed.  The patient is thin and elderly and somewhat agitated.  HENT:  Head: Normocephalic and atraumatic.  Right Ear: External ear normal.  Left Ear: External ear normal.  Nose: Nose normal.  Mouth/Throat: Oropharynx is clear and moist.  Eyes: Conjunctivae and EOM are normal. Pupils are equal, round, and reactive to light. Right eye exhibits no discharge. Left eye exhibits no discharge. No scleral icterus.  Neck: Normal range of motion. Neck supple. No thyromegaly present.  Good mobility without thyromegaly or anterior cervical adenopathy  Cardiovascular: Normal rate, regular rhythm and normal heart sounds.   No murmur heard. The heart was regular at 84/m  Pulmonary/Chest: Effort normal and breath sounds normal. No respiratory distress. She has no wheezes. She has no rales.  Clear anteriorly and posteriorly  Abdominal: Soft. Bowel sounds are normal. She exhibits no mass. There is no tenderness. There is no rebound and no guarding.  No abdominal tenderness liver or spleen enlargement masses or bruits  Musculoskeletal: She exhibits tenderness. She exhibits no edema.  She walks with a somewhat kyphotic posture because of the pain in her hip. The patient is somewhat hesitant with walking due to the pain in her right sacroiliac area. She is tender in this area. She has good leg raising and hip abduction and abduction without pain. This tenderness over the sacroiliac area. Reflexes were good.  Lymphadenopathy:    She has no cervical adenopathy.  Neurological: She is alert and oriented to person, place, and time.  Skin: Skin is warm and dry. No rash noted.  Psychiatric: She has a normal mood and affect. Her behavior is normal. Judgment and thought content normal.  The patient is somewhat stubborn and agitated as she is struggling to maintain her independence but at the same timethat she needs to get some help and that she needs more assistance 7 days a week 24 hours a day.  Nursing note and  vitals reviewed.  BP (!) 111/55 (BP Location: Left Arm)   Pulse 85   Temp 98.1 F (36.7 C) (Oral)   Ht 5' 5"  (1.651 m)   Wt 115 lb (52.2 kg)   BMI 19.14 kg/m         Assessment & Plan:  1. Hypothyroidism due to acquired atrophy of thyroid - CBC with Differential/Platelet - Thyroid Panel With TSH  2. Memory impairment -The patient needs more care with better eating habits and making sure she is taking her medicines correctly. - CBC with Differential/Platelet  3. Essential hypertension -The blood pressure is good today and she will continue with current treatment - CBC with Differential/Platelet - BMP8+EGFR - Hepatic function panel - Lipid panel  4. Right hip pain -We will x-ray the hip and LS spine to further assess what we can do to help her -Use Lidoderm patches as directed Take extra strength Tylenol as directed- - CBC with Differential/Platelet - Ambulatory referral to Orthopedic Surgery - DG HIP UNILAT W OR W/O PELVIS 2-3 VIEWS RIGHT; Future  5. Vitamin D deficiency -Continue current  treatment pending results of lab work - VITAMIN D 25 Hydroxy (Vit-D Deficiency, Fractures)  6. Frailty syndrome in geriatric patient -Consider assisted living facility for more ongoing care 7 days a week 24 hours a day  7. Weight loss -Eating more healthy and eating regularly but not sure that we can attain this with her staying at home  8. Agitation -She is deathly somewhat agitated today but did seem to listen although asking her to recall she was not able to do that.  Patient Instructions                       Medicare Annual Wellness Visit  [facility] and the medical providers at [facility] strive to bring you the best medical care.  In doing so we not only want to address your current medical conditions and concerns but also to detect new conditions early and prevent illness, disease and health-related problems.    Medicare offers a yearly  Wellness Visit which allows our clinical staff to assess your need for preventative services including immunizations, lifestyle education, counseling to decrease risk of preventable diseases and screening for fall risk and other medical concerns.    This visit is provided free of charge (no copay) for all Medicare recipients. The clinical pharmacists at [facility] have begun to conduct these Wellness Visits which will also include a thorough review of all your medications.    As you primary medical provider recommend that you make an appointment for your Annual Wellness Visit if you have not done so already this year.  You may set up this appointment before you leave today or you may call back ([phone]) and schedule an appointment.  Please make sure when you call that you mention that you are scheduling your Annual Wellness Visit with the clinical pharmacist so that the appointment may be made for the proper length of time.     Continue current medications. Continue good therapeutic lifestyle changes which include good diet and exercise. Fall precautions discussed with patient. If an FOBT was given today- please return it to our front desk. If you are over 41 years old - you may need Prevnar 58 or the adult Pneumonia vaccine.  **Flu shots are available--- please call and schedule a FLU-CLINIC appointment**  After your visit with [place] today you will receive a survey in the mail or online from [employer] regarding your care with [place]. Please take a moment to fill this out. Your feedback is very important to [place] as you can help [place] better understand your patient needs as well as improve your experience and satisfaction. WE CARE ABOUT YOU!!!   The patient's friend/caregiver will take her to visit the assisted living facility here in the community and hopefully asked appropriate questions to settle the patient's mindset about where she needs to be The patient really needs more care  than she is currently getting at home and more assistance with her daily living habits her medicines and her eating. A great deal of time was spent discussing this with her and I hope that she will pursue this assisted living facility for her long-term benefit.  Arrie Senate MD

## 2017-04-13 NOTE — Patient Instructions (Addendum)
Medicare Annual Wellness Visit  [facility] and the medical providers at [facility] strive to bring you the best medical care.  In doing so we not only want to address your current medical conditions and concerns but also to detect new conditions early and prevent illness, disease and health-related problems.    Medicare offers a yearly Wellness Visit which allows our clinical staff to assess your need for preventative services including immunizations, lifestyle education, counseling to decrease risk of preventable diseases and screening for fall risk and other medical concerns.    This visit is provided free of charge (no copay) for all Medicare recipients. The clinical pharmacists at [facility] have begun to conduct these Wellness Visits which will also include a thorough review of all your medications.    As you primary medical provider recommend that you make an appointment for your Annual Wellness Visit if you have not done so already this year.  You may set up this appointment before you leave today or you may call back ([phone]) and schedule an appointment.  Please make sure when you call that you mention that you are scheduling your Annual Wellness Visit with the clinical pharmacist so that the appointment may be made for the proper length of time.     Continue current medications. Continue good therapeutic lifestyle changes which include good diet and exercise. Fall precautions discussed with patient. If an FOBT was given today- please return it to our front desk. If you are over 15 years old - you may need Prevnar 28 or the adult Pneumonia vaccine.  **Flu shots are available--- please call and schedule a FLU-CLINIC appointment**  After your visit with [place] today you will receive a survey in the mail or online from [employer] regarding your care with [place]. Please take a moment to fill this out. Your feedback is very  important to [place] as you can help [place] better understand your patient needs as well as improve your experience and satisfaction. WE CARE ABOUT YOU!!!   The patient's friend/caregiver will take her to visit the assisted living facility here in the community and hopefully asked appropriate questions to settle the patient's mindset about where she needs to be The patient really needs more care than she is currently getting at home and more assistance with her daily living habits her medicines and her eating. A great deal of time was spent discussing this with her and I hope that she will pursue this assisted living facility for her long-term benefit.

## 2017-04-14 LAB — LIPID PANEL
Chol/HDL Ratio: 3.2 ratio (ref 0.0–4.4)
Cholesterol, Total: 146 mg/dL (ref 100–199)
HDL: 46 mg/dL (ref >39)
LDL Calculated: 87 mg/dL (ref 0–99)
TRIGLYCERIDES: 65 mg/dL (ref 0–149)
VLDL Cholesterol Cal: 13 mg/dL (ref 5–40)

## 2017-04-14 LAB — CBC WITH DIFFERENTIAL/PLATELET
BASOS ABS: 0 10*3/uL (ref 0.0–0.2)
BASOS: 0 %
EOS (ABSOLUTE): 0 10*3/uL (ref 0.0–0.4)
Eos: 0 %
Hematocrit: 33.7 % — ABNORMAL LOW (ref 34.0–46.6)
Hemoglobin: 11.3 g/dL (ref 11.1–15.9)
IMMATURE GRANS (ABS): 0 10*3/uL (ref 0.0–0.1)
IMMATURE GRANULOCYTES: 0 %
LYMPHS: 9 %
Lymphocytes Absolute: 1.1 10*3/uL (ref 0.7–3.1)
MCH: 30.7 pg (ref 26.6–33.0)
MCHC: 33.5 g/dL (ref 31.5–35.7)
MCV: 92 fL (ref 79–97)
Monocytes Absolute: 1.2 10*3/uL — ABNORMAL HIGH (ref 0.1–0.9)
Monocytes: 9 %
NEUTROS PCT: 82 %
Neutrophils Absolute: 10.5 10*3/uL — ABNORMAL HIGH (ref 1.4–7.0)
PLATELETS: 297 10*3/uL (ref 150–379)
RBC: 3.68 x10E6/uL — AB (ref 3.77–5.28)
RDW: 14.5 % (ref 12.3–15.4)
WBC: 12.8 10*3/uL — ABNORMAL HIGH (ref 3.4–10.8)

## 2017-04-14 LAB — BMP8+EGFR
BUN/Creatinine Ratio: 16 (ref 12–28)
BUN: 15 mg/dL (ref 8–27)
CALCIUM: 9.3 mg/dL (ref 8.7–10.3)
CHLORIDE: 99 mmol/L (ref 96–106)
CO2: 22 mmol/L (ref 20–29)
Creatinine, Ser: 0.92 mg/dL (ref 0.57–1.00)
GFR calc Af Amer: 65 mL/min/{1.73_m2} (ref >59)
GFR calc non Af Amer: 57 mL/min/{1.73_m2} — ABNORMAL LOW (ref >59)
GLUCOSE: 102 mg/dL — AB (ref 65–99)
Potassium: 5 mmol/L (ref 3.5–5.2)
Sodium: 135 mmol/L (ref 134–144)

## 2017-04-14 LAB — HEPATIC FUNCTION PANEL
ALT: 13 IU/L (ref 0–32)
AST: 19 IU/L (ref 0–40)
Albumin: 3.4 g/dL — ABNORMAL LOW (ref 3.5–4.7)
Alkaline Phosphatase: 67 IU/L (ref 39–117)
BILIRUBIN TOTAL: 0.6 mg/dL (ref 0.0–1.2)
Bilirubin, Direct: 0.22 mg/dL (ref 0.00–0.40)
TOTAL PROTEIN: 6.1 g/dL (ref 6.0–8.5)

## 2017-04-14 LAB — THYROID PANEL WITH TSH
FREE THYROXINE INDEX: 2.9 (ref 1.2–4.9)
T3 UPTAKE RATIO: 35 % (ref 24–39)
T4, Total: 8.4 ug/dL (ref 4.5–12.0)
TSH: 2.18 u[IU]/mL (ref 0.450–4.500)

## 2017-04-14 LAB — VITAMIN D 25 HYDROXY (VIT D DEFICIENCY, FRACTURES): VIT D 25 HYDROXY: 49.1 ng/mL (ref 30.0–100.0)

## 2017-04-15 DIAGNOSIS — Z79899 Other long term (current) drug therapy: Secondary | ICD-10-CM | POA: Diagnosis not present

## 2017-04-15 DIAGNOSIS — M546 Pain in thoracic spine: Secondary | ICD-10-CM | POA: Diagnosis not present

## 2017-04-15 DIAGNOSIS — M25551 Pain in right hip: Secondary | ICD-10-CM | POA: Diagnosis not present

## 2017-04-15 DIAGNOSIS — M5136 Other intervertebral disc degeneration, lumbar region: Secondary | ICD-10-CM | POA: Diagnosis not present

## 2017-04-15 DIAGNOSIS — R52 Pain, unspecified: Secondary | ICD-10-CM | POA: Diagnosis not present

## 2017-04-15 DIAGNOSIS — M545 Low back pain: Secondary | ICD-10-CM | POA: Diagnosis not present

## 2017-04-16 ENCOUNTER — Telehealth: Payer: Self-pay | Admitting: Family Medicine

## 2017-04-16 NOTE — Telephone Encounter (Signed)
LMOVM that notes have been faxed to [facility]'s office for appt to be schedule. Left [facility]'s ph# [phone] on VM as well.

## 2017-04-16 NOTE — Telephone Encounter (Signed)
Aware of lab results  

## 2017-04-17 ENCOUNTER — Ambulatory Visit (INDEPENDENT_AMBULATORY_CARE_PROVIDER_SITE_OTHER): Payer: Medicare Other | Admitting: Physician Assistant

## 2017-04-17 ENCOUNTER — Encounter: Payer: Self-pay | Admitting: Physician Assistant

## 2017-04-17 ENCOUNTER — Telehealth: Payer: Self-pay | Admitting: Family Medicine

## 2017-04-17 VITALS — BP 85/46 | HR 86 | Temp 98.4°F

## 2017-04-17 DIAGNOSIS — M25551 Pain in right hip: Secondary | ICD-10-CM | POA: Diagnosis not present

## 2017-04-17 NOTE — Patient Instructions (Signed)
Bursitis Bursitis is when the fluid-filled sac (bursa) that covers and protects a joint is swollen (inflamed). Bursitis is most common near joints, especially the knees, elbows, hips, and shoulders. Follow these instructions at home:  Take medicines only as told by your doctor.  If you were prescribed an antibiotic medicine, finish it all even if you start to feel better.  Rest the affected area as told by your doctor. ? Keep the area raised up. ? Avoid doing things that make the pain worse.  Apply ice to the injured area: ? Place ice in a plastic bag. ? Place a towel between your skin and the bag. ? Leave the ice on for 20 minutes, 2-3 times a day.  Use splints, braces, pads, or walking aids as told by your doctor.  Keep all follow-up visits as told by your doctor. This is important. Contact a doctor if:  You have more pain with home care.  You have a fever.  You have chills. This information is not intended to replace advice given to you by your health care provider. Make sure you discuss any questions you have with your health care provider. Document Released: 04/05/2010 Document Revised: 03/23/2016 Document Reviewed: 01/05/2014 Elsevier Interactive Patient Education  2018 Elsevier Inc.  

## 2017-04-17 NOTE — Telephone Encounter (Signed)
Appt made to be seen

## 2017-04-17 NOTE — Progress Notes (Signed)
Subjective:     Patient ID: [name], female   DOB: Apr 01, 1928, 83 y.o.   MRN: [id number]  HPI Pt with R hip pain Seen both here and through the ER Xrays show some degen changes in the L-spine but both hips look good In discussion with pt pain to the lateral hip Tylenol is not currently helping sx She denies any numbness or radicular sx  Review of Systems  Cardiovascular: Negative for leg swelling.  Musculoskeletal: Positive for arthralgias, back pain and myalgias.       Objective:   Physical Exam Gait protection FROM of the hip w/o sx No sx with int/ext rotation or lateral abd/adduction Good strength distal +++ TTP of the lateral hip Good strength distal Pulses/sensory good Offered inj today Consent obtained Area cleansed with Betadine Inj 1 cc Celestone 1cc marcaine Under sterile technique today Dressing placed    Assessment:     1. Right hip pain        Plan:     Heat/Ice Continue with Tylenol Activities as tol Keep appt with ortho F/U prn

## 2017-04-25 ENCOUNTER — Telehealth: Payer: Self-pay | Admitting: Family Medicine

## 2017-04-25 NOTE — Telephone Encounter (Signed)
Lm 6/27-jhb

## 2017-05-04 ENCOUNTER — Telehealth: Payer: Self-pay | Admitting: *Deleted

## 2017-05-04 ENCOUNTER — Telehealth: Payer: Self-pay | Admitting: Family Medicine

## 2017-05-04 DIAGNOSIS — M461 Sacroiliitis, not elsewhere classified: Secondary | ICD-10-CM | POA: Diagnosis not present

## 2017-05-04 DIAGNOSIS — M25551 Pain in right hip: Secondary | ICD-10-CM | POA: Diagnosis not present

## 2017-05-04 DIAGNOSIS — Z79899 Other long term (current) drug therapy: Secondary | ICD-10-CM | POA: Diagnosis not present

## 2017-05-04 DIAGNOSIS — R4182 Altered mental status, unspecified: Secondary | ICD-10-CM | POA: Diagnosis not present

## 2017-05-04 DIAGNOSIS — R319 Hematuria, unspecified: Secondary | ICD-10-CM | POA: Diagnosis not present

## 2017-05-04 DIAGNOSIS — M545 Low back pain: Secondary | ICD-10-CM | POA: Diagnosis not present

## 2017-05-04 DIAGNOSIS — Z950 Presence of cardiac pacemaker: Secondary | ICD-10-CM | POA: Diagnosis not present

## 2017-05-04 DIAGNOSIS — I10 Essential (primary) hypertension: Secondary | ICD-10-CM | POA: Diagnosis not present

## 2017-05-04 DIAGNOSIS — I6789 Other cerebrovascular disease: Secondary | ICD-10-CM | POA: Diagnosis not present

## 2017-05-04 DIAGNOSIS — R39198 Other difficulties with micturition: Secondary | ICD-10-CM | POA: Diagnosis not present

## 2017-05-04 DIAGNOSIS — E039 Hypothyroidism, unspecified: Secondary | ICD-10-CM | POA: Diagnosis not present

## 2017-05-04 DIAGNOSIS — Z90710 Acquired absence of both cervix and uterus: Secondary | ICD-10-CM | POA: Diagnosis not present

## 2017-05-04 DIAGNOSIS — F4489 Other dissociative and conversion disorders: Secondary | ICD-10-CM | POA: Diagnosis not present

## 2017-05-04 DIAGNOSIS — M546 Pain in thoracic spine: Secondary | ICD-10-CM | POA: Diagnosis not present

## 2017-05-04 NOTE — Telephone Encounter (Signed)
spoke with pt - she states she feels dizzy, very confused = she thinks she drove to the DR this morning - she called here (did not come) She states she does not "feel right"  Had a nose bleed???  She wants to come here or go to the ER - does not have a ride  I called [facility] - not a working number  I called son and LM to Call We are not to call [name] (friend ) any longer  Pt aware to call 911 and have them evaluate her

## 2017-05-04 NOTE — Telephone Encounter (Signed)
LM for kathy to call back

## 2017-05-04 NOTE — Telephone Encounter (Signed)
w

## 2017-05-04 NOTE — Telephone Encounter (Signed)
Spoke with pt - she states she feels better now, no dizziness and she is eating her breakfast - she is aware to call us back if the problem returns.

## 2017-05-07 DIAGNOSIS — E039 Hypothyroidism, unspecified: Secondary | ICD-10-CM | POA: Diagnosis not present

## 2017-05-07 DIAGNOSIS — R410 Disorientation, unspecified: Secondary | ICD-10-CM | POA: Diagnosis not present

## 2017-05-07 DIAGNOSIS — I10 Essential (primary) hypertension: Secondary | ICD-10-CM | POA: Diagnosis not present

## 2017-05-07 DIAGNOSIS — F0281 Dementia in other diseases classified elsewhere with behavioral disturbance: Secondary | ICD-10-CM | POA: Diagnosis not present

## 2017-05-07 DIAGNOSIS — R443 Hallucinations, unspecified: Secondary | ICD-10-CM | POA: Diagnosis not present

## 2017-05-07 DIAGNOSIS — G309 Alzheimer's disease, unspecified: Secondary | ICD-10-CM | POA: Diagnosis not present

## 2017-05-07 DIAGNOSIS — R627 Adult failure to thrive: Secondary | ICD-10-CM | POA: Diagnosis not present

## 2017-05-09 DIAGNOSIS — R4182 Altered mental status, unspecified: Secondary | ICD-10-CM | POA: Diagnosis not present

## 2017-05-10 DIAGNOSIS — R296 Repeated falls: Secondary | ICD-10-CM | POA: Diagnosis not present

## 2017-05-10 DIAGNOSIS — I351 Nonrheumatic aortic (valve) insufficiency: Secondary | ICD-10-CM | POA: Diagnosis not present

## 2017-05-10 DIAGNOSIS — M6281 Muscle weakness (generalized): Secondary | ICD-10-CM | POA: Diagnosis not present

## 2017-05-10 DIAGNOSIS — M8580 Other specified disorders of bone density and structure, unspecified site: Secondary | ICD-10-CM | POA: Diagnosis not present

## 2017-05-10 DIAGNOSIS — R54 Age-related physical debility: Secondary | ICD-10-CM | POA: Diagnosis not present

## 2017-05-10 DIAGNOSIS — R4182 Altered mental status, unspecified: Secondary | ICD-10-CM | POA: Diagnosis not present

## 2017-05-10 DIAGNOSIS — F0281 Dementia in other diseases classified elsewhere with behavioral disturbance: Secondary | ICD-10-CM | POA: Diagnosis present

## 2017-05-10 DIAGNOSIS — I459 Conduction disorder, unspecified: Secondary | ICD-10-CM | POA: Diagnosis not present

## 2017-05-10 DIAGNOSIS — R488 Other symbolic dysfunctions: Secondary | ICD-10-CM | POA: Diagnosis not present

## 2017-05-10 DIAGNOSIS — Z79899 Other long term (current) drug therapy: Secondary | ICD-10-CM | POA: Diagnosis not present

## 2017-05-10 DIAGNOSIS — R443 Hallucinations, unspecified: Secondary | ICD-10-CM | POA: Diagnosis present

## 2017-05-10 DIAGNOSIS — I340 Nonrheumatic mitral (valve) insufficiency: Secondary | ICD-10-CM | POA: Diagnosis not present

## 2017-05-10 DIAGNOSIS — R9082 White matter disease, unspecified: Secondary | ICD-10-CM | POA: Diagnosis not present

## 2017-05-10 DIAGNOSIS — I442 Atrioventricular block, complete: Secondary | ICD-10-CM | POA: Diagnosis not present

## 2017-05-10 DIAGNOSIS — R627 Adult failure to thrive: Secondary | ICD-10-CM | POA: Diagnosis present

## 2017-05-10 DIAGNOSIS — R278 Other lack of coordination: Secondary | ICD-10-CM | POA: Diagnosis not present

## 2017-05-10 DIAGNOSIS — M5430 Sciatica, unspecified side: Secondary | ICD-10-CM | POA: Diagnosis not present

## 2017-05-10 DIAGNOSIS — R011 Cardiac murmur, unspecified: Secondary | ICD-10-CM | POA: Diagnosis not present

## 2017-05-10 DIAGNOSIS — I10 Essential (primary) hypertension: Secondary | ICD-10-CM | POA: Diagnosis present

## 2017-05-10 DIAGNOSIS — M5136 Other intervertebral disc degeneration, lumbar region: Secondary | ICD-10-CM | POA: Diagnosis not present

## 2017-05-10 DIAGNOSIS — Z950 Presence of cardiac pacemaker: Secondary | ICD-10-CM | POA: Diagnosis not present

## 2017-05-10 DIAGNOSIS — E039 Hypothyroidism, unspecified: Secondary | ICD-10-CM | POA: Diagnosis present

## 2017-05-10 DIAGNOSIS — R410 Disorientation, unspecified: Secondary | ICD-10-CM | POA: Diagnosis not present

## 2017-05-10 DIAGNOSIS — Z87440 Personal history of urinary (tract) infections: Secondary | ICD-10-CM | POA: Diagnosis not present

## 2017-05-10 DIAGNOSIS — R41841 Cognitive communication deficit: Secondary | ICD-10-CM | POA: Diagnosis not present

## 2017-05-10 DIAGNOSIS — G309 Alzheimer's disease, unspecified: Secondary | ICD-10-CM | POA: Diagnosis present

## 2017-05-10 DIAGNOSIS — R451 Restlessness and agitation: Secondary | ICD-10-CM | POA: Diagnosis not present

## 2017-05-10 DIAGNOSIS — M545 Low back pain: Secondary | ICD-10-CM | POA: Diagnosis not present

## 2017-05-10 DIAGNOSIS — R002 Palpitations: Secondary | ICD-10-CM | POA: Diagnosis not present

## 2017-05-10 DIAGNOSIS — E559 Vitamin D deficiency, unspecified: Secondary | ICD-10-CM | POA: Diagnosis not present

## 2017-05-10 DIAGNOSIS — M533 Sacrococcygeal disorders, not elsewhere classified: Secondary | ICD-10-CM | POA: Diagnosis not present

## 2017-05-11 ENCOUNTER — Ambulatory Visit: Payer: Medicare Other | Admitting: Family Medicine

## 2017-05-14 ENCOUNTER — Ambulatory Visit: Payer: Medicare Other | Admitting: Thoracic Surgery (Cardiothoracic Vascular Surgery)

## 2017-05-18 DIAGNOSIS — E039 Hypothyroidism, unspecified: Secondary | ICD-10-CM | POA: Diagnosis not present

## 2017-05-18 DIAGNOSIS — I10 Essential (primary) hypertension: Secondary | ICD-10-CM | POA: Diagnosis not present

## 2017-05-18 DIAGNOSIS — M8580 Other specified disorders of bone density and structure, unspecified site: Secondary | ICD-10-CM | POA: Diagnosis not present

## 2017-05-18 DIAGNOSIS — R451 Restlessness and agitation: Secondary | ICD-10-CM | POA: Diagnosis not present

## 2017-05-18 DIAGNOSIS — R296 Repeated falls: Secondary | ICD-10-CM | POA: Diagnosis not present

## 2017-05-18 DIAGNOSIS — R410 Disorientation, unspecified: Secondary | ICD-10-CM | POA: Diagnosis not present

## 2017-05-18 DIAGNOSIS — M6281 Muscle weakness (generalized): Secondary | ICD-10-CM | POA: Diagnosis not present

## 2017-05-18 DIAGNOSIS — R488 Other symbolic dysfunctions: Secondary | ICD-10-CM | POA: Diagnosis not present

## 2017-05-18 DIAGNOSIS — R4182 Altered mental status, unspecified: Secondary | ICD-10-CM | POA: Diagnosis not present

## 2017-05-18 DIAGNOSIS — R011 Cardiac murmur, unspecified: Secondary | ICD-10-CM | POA: Diagnosis not present

## 2017-05-18 DIAGNOSIS — F0391 Unspecified dementia with behavioral disturbance: Secondary | ICD-10-CM | POA: Diagnosis not present

## 2017-05-18 DIAGNOSIS — M5136 Other intervertebral disc degeneration, lumbar region: Secondary | ICD-10-CM | POA: Diagnosis not present

## 2017-05-18 DIAGNOSIS — R278 Other lack of coordination: Secondary | ICD-10-CM | POA: Diagnosis not present

## 2017-05-18 DIAGNOSIS — R636 Underweight: Secondary | ICD-10-CM | POA: Diagnosis not present

## 2017-05-18 DIAGNOSIS — R9082 White matter disease, unspecified: Secondary | ICD-10-CM | POA: Diagnosis not present

## 2017-05-18 DIAGNOSIS — D72828 Other elevated white blood cell count: Secondary | ICD-10-CM | POA: Diagnosis not present

## 2017-05-18 DIAGNOSIS — M533 Sacrococcygeal disorders, not elsewhere classified: Secondary | ICD-10-CM | POA: Diagnosis not present

## 2017-05-18 DIAGNOSIS — I351 Nonrheumatic aortic (valve) insufficiency: Secondary | ICD-10-CM | POA: Diagnosis not present

## 2017-05-18 DIAGNOSIS — R002 Palpitations: Secondary | ICD-10-CM | POA: Diagnosis not present

## 2017-05-18 DIAGNOSIS — I459 Conduction disorder, unspecified: Secondary | ICD-10-CM | POA: Diagnosis not present

## 2017-05-18 DIAGNOSIS — E559 Vitamin D deficiency, unspecified: Secondary | ICD-10-CM | POA: Diagnosis not present

## 2017-05-18 DIAGNOSIS — I340 Nonrheumatic mitral (valve) insufficiency: Secondary | ICD-10-CM | POA: Diagnosis not present

## 2017-05-18 DIAGNOSIS — F0281 Dementia in other diseases classified elsewhere with behavioral disturbance: Secondary | ICD-10-CM | POA: Diagnosis not present

## 2017-05-18 DIAGNOSIS — R41841 Cognitive communication deficit: Secondary | ICD-10-CM | POA: Diagnosis not present

## 2017-05-18 DIAGNOSIS — Z950 Presence of cardiac pacemaker: Secondary | ICD-10-CM | POA: Diagnosis not present

## 2017-05-18 DIAGNOSIS — M5430 Sciatica, unspecified side: Secondary | ICD-10-CM | POA: Diagnosis not present

## 2017-05-18 DIAGNOSIS — M545 Low back pain: Secondary | ICD-10-CM | POA: Diagnosis not present

## 2017-05-18 DIAGNOSIS — R52 Pain, unspecified: Secondary | ICD-10-CM | POA: Diagnosis not present

## 2017-05-18 DIAGNOSIS — I442 Atrioventricular block, complete: Secondary | ICD-10-CM | POA: Diagnosis not present

## 2017-05-18 DIAGNOSIS — R54 Age-related physical debility: Secondary | ICD-10-CM | POA: Diagnosis not present

## 2017-05-22 DIAGNOSIS — F0391 Unspecified dementia with behavioral disturbance: Secondary | ICD-10-CM | POA: Diagnosis not present

## 2017-05-22 DIAGNOSIS — R54 Age-related physical debility: Secondary | ICD-10-CM | POA: Diagnosis not present

## 2017-05-22 DIAGNOSIS — E039 Hypothyroidism, unspecified: Secondary | ICD-10-CM | POA: Diagnosis not present

## 2017-05-22 DIAGNOSIS — R52 Pain, unspecified: Secondary | ICD-10-CM | POA: Diagnosis not present

## 2017-05-23 ENCOUNTER — Telehealth: Payer: Self-pay | Admitting: Family Medicine

## 2017-05-23 NOTE — Telephone Encounter (Signed)
Last two office notes faxed to [place] @ the [facility] [phone].

## 2017-05-25 DIAGNOSIS — D72828 Other elevated white blood cell count: Secondary | ICD-10-CM | POA: Diagnosis not present

## 2017-05-29 ENCOUNTER — Other Ambulatory Visit: Payer: Self-pay | Admitting: Family Medicine

## 2017-05-29 DIAGNOSIS — R52 Pain, unspecified: Secondary | ICD-10-CM | POA: Diagnosis not present

## 2017-05-29 DIAGNOSIS — R636 Underweight: Secondary | ICD-10-CM | POA: Diagnosis not present

## 2017-05-29 DIAGNOSIS — E039 Hypothyroidism, unspecified: Secondary | ICD-10-CM | POA: Diagnosis not present

## 2017-05-29 DIAGNOSIS — R54 Age-related physical debility: Secondary | ICD-10-CM | POA: Diagnosis not present

## 2017-05-30 ENCOUNTER — Telehealth: Payer: Self-pay | Admitting: Family Medicine

## 2017-05-30 NOTE — Telephone Encounter (Signed)
Spoke with Morey Hummingbird  - she is doing a protective investigation on the pt = she trying to get pt placed at a assisted living facility. She is doing better with memory. She wants [facility] aware of the investigation.  She is aware to call us back with any questions.

## 2017-05-31 ENCOUNTER — Telehealth: Payer: Self-pay | Admitting: Family Medicine

## 2017-05-31 NOTE — Telephone Encounter (Signed)
Faxed note to [facility]

## 2017-06-13 ENCOUNTER — Other Ambulatory Visit: Payer: Self-pay | Admitting: Internal Medicine

## 2017-06-20 ENCOUNTER — Encounter: Payer: Medicare Other | Admitting: *Deleted

## 2017-06-20 ENCOUNTER — Telehealth: Payer: Self-pay | Admitting: Cardiology

## 2017-06-20 NOTE — Telephone Encounter (Signed)
LMOVM reminding pt to send remote transmission.   

## 2017-06-21 ENCOUNTER — Encounter: Payer: Self-pay | Admitting: Cardiology

## 2017-06-25 ENCOUNTER — Emergency Department (HOSPITAL_COMMUNITY): Payer: Medicare Other

## 2017-06-25 ENCOUNTER — Emergency Department (HOSPITAL_COMMUNITY)
Admission: EM | Admit: 2017-06-25 | Discharge: 2017-06-25 | Disposition: A | Discharge status: Skilled Nursing Facility | Payer: Medicare Other | Attending: Emergency Medicine | Admitting: Emergency Medicine

## 2017-06-25 ENCOUNTER — Encounter (HOSPITAL_COMMUNITY): Payer: Self-pay | Admitting: Emergency Medicine

## 2017-06-25 DIAGNOSIS — S3992XA Unspecified injury of lower back, initial encounter: Secondary | ICD-10-CM | POA: Diagnosis not present

## 2017-06-25 DIAGNOSIS — Z7401 Bed confinement status: Secondary | ICD-10-CM | POA: Diagnosis not present

## 2017-06-25 DIAGNOSIS — R031 Nonspecific low blood-pressure reading: Secondary | ICD-10-CM | POA: Diagnosis not present

## 2017-06-25 DIAGNOSIS — W19XXXA Unspecified fall, initial encounter: Secondary | ICD-10-CM

## 2017-06-25 DIAGNOSIS — I10 Essential (primary) hypertension: Secondary | ICD-10-CM | POA: Diagnosis not present

## 2017-06-25 DIAGNOSIS — Z79899 Other long term (current) drug therapy: Secondary | ICD-10-CM | POA: Diagnosis not present

## 2017-06-25 DIAGNOSIS — S3993XA Unspecified injury of pelvis, initial encounter: Secondary | ICD-10-CM | POA: Diagnosis not present

## 2017-06-25 DIAGNOSIS — F0390 Unspecified dementia without behavioral disturbance: Secondary | ICD-10-CM | POA: Insufficient documentation

## 2017-06-25 DIAGNOSIS — Z950 Presence of cardiac pacemaker: Secondary | ICD-10-CM | POA: Diagnosis not present

## 2017-06-25 DIAGNOSIS — E039 Hypothyroidism, unspecified: Secondary | ICD-10-CM | POA: Diagnosis not present

## 2017-06-25 DIAGNOSIS — R279 Unspecified lack of coordination: Secondary | ICD-10-CM | POA: Diagnosis not present

## 2017-06-25 DIAGNOSIS — R404 Transient alteration of awareness: Secondary | ICD-10-CM | POA: Diagnosis not present

## 2017-06-25 DIAGNOSIS — J90 Pleural effusion, not elsewhere classified: Secondary | ICD-10-CM | POA: Diagnosis not present

## 2017-06-25 DIAGNOSIS — N390 Urinary tract infection, site not specified: Secondary | ICD-10-CM | POA: Diagnosis not present

## 2017-06-25 DIAGNOSIS — N3001 Acute cystitis with hematuria: Secondary | ICD-10-CM | POA: Diagnosis not present

## 2017-06-25 DIAGNOSIS — R4182 Altered mental status, unspecified: Secondary | ICD-10-CM | POA: Diagnosis not present

## 2017-06-25 LAB — URINALYSIS, ROUTINE W REFLEX MICROSCOPIC
Bacteria, UA: NONE SEEN
Bilirubin Urine: NEGATIVE
Glucose, UA: NEGATIVE mg/dL
KETONES UR: 5 mg/dL — AB
LEUKOCYTES UA: NEGATIVE
NITRITE: NEGATIVE
PROTEIN: NEGATIVE mg/dL
Specific Gravity, Urine: 1.013 (ref 1.005–1.030)
pH: 5 (ref 5.0–8.0)

## 2017-06-25 LAB — CBC WITH DIFFERENTIAL/PLATELET
Basophils Absolute: 0 10*3/uL (ref 0.0–0.1)
Basophils Relative: 0 %
EOS ABS: 0 10*3/uL (ref 0.0–0.7)
Eosinophils Relative: 0 %
HCT: 28 % — ABNORMAL LOW (ref 36.0–46.0)
HEMOGLOBIN: 9.4 g/dL — AB (ref 12.0–15.0)
Lymphocytes Relative: 6 %
Lymphs Abs: 0.8 10*3/uL (ref 0.7–4.0)
MCH: 30.8 pg (ref 26.0–34.0)
MCHC: 33.6 g/dL (ref 30.0–36.0)
MCV: 91.8 fL (ref 78.0–100.0)
Monocytes Absolute: 1.3 10*3/uL — ABNORMAL HIGH (ref 0.1–1.0)
Monocytes Relative: 9 %
NEUTROS PCT: 85 %
Neutro Abs: 12.2 10*3/uL — ABNORMAL HIGH (ref 1.7–7.7)
Platelets: 271 10*3/uL (ref 150–400)
RBC: 3.05 MIL/uL — AB (ref 3.87–5.11)
RDW: 16.1 % — ABNORMAL HIGH (ref 11.5–15.5)
WBC: 14.3 10*3/uL — AB (ref 4.0–10.5)

## 2017-06-25 LAB — COMPREHENSIVE METABOLIC PANEL
ALK PHOS: 70 U/L (ref 38–126)
ALT: 16 U/L (ref 14–54)
ANION GAP: 8 (ref 5–15)
AST: 25 U/L (ref 15–41)
Albumin: 2.6 g/dL — ABNORMAL LOW (ref 3.5–5.0)
BUN: 22 mg/dL — ABNORMAL HIGH (ref 6–20)
CALCIUM: 8.1 mg/dL — AB (ref 8.9–10.3)
CHLORIDE: 100 mmol/L — AB (ref 101–111)
CO2: 22 mmol/L (ref 22–32)
CREATININE: 0.95 mg/dL (ref 0.44–1.00)
GFR, EST NON AFRICAN AMERICAN: 53 mL/min — AB (ref >60)
Glucose, Bld: 116 mg/dL — ABNORMAL HIGH (ref 65–99)
Potassium: 3.6 mmol/L (ref 3.5–5.1)
Sodium: 130 mmol/L — ABNORMAL LOW (ref 135–145)
Total Bilirubin: 1.2 mg/dL (ref 0.3–1.2)
Total Protein: 5.1 g/dL — ABNORMAL LOW (ref 6.5–8.1)

## 2017-06-25 LAB — I-STAT CG4 LACTIC ACID, ED: LACTIC ACID, VENOUS: 1.02 mmol/L (ref 0.5–1.9)

## 2017-06-25 LAB — TSH: TSH: 8.816 u[IU]/mL — AB (ref 0.350–4.500)

## 2017-06-25 MED ORDER — LORAZEPAM 2 MG/ML IJ SOLN
0.5000 mg | Freq: Once | INTRAMUSCULAR | Status: AC
  Administered 2017-06-25: 0.5 mg via INTRAVENOUS
  Filled 2017-06-25: qty 1

## 2017-06-25 MED ORDER — SODIUM CHLORIDE 0.9 % IV BOLUS (SEPSIS)
1000.0000 mL | Freq: Once | INTRAVENOUS | Status: AC
  Administered 2017-06-25: 1000 mL via INTRAVENOUS

## 2017-06-25 MED ORDER — SULFAMETHOXAZOLE-TRIMETHOPRIM 800-160 MG PO TABS
1.0000 | ORAL_TABLET | Freq: Two times a day (BID) | ORAL | 0 refills | Status: AC
Start: 2017-06-25 — End: 2017-07-02

## 2017-06-25 MED ORDER — KETOROLAC TROMETHAMINE 30 MG/ML IJ SOLN
15.0000 mg | Freq: Once | INTRAMUSCULAR | Status: AC
  Administered 2017-06-25: 15 mg via INTRAVENOUS
  Filled 2017-06-25: qty 1

## 2017-06-25 NOTE — Discharge Instructions (Addendum)
Drink plenty of fluids.  Speak with her md at the nursing home about further evaluation for dementia,  and follow up your thyroid studies.

## 2017-06-25 NOTE — ED Provider Notes (Signed)
[facility] DEPT Provider Note   CSN: [id number] Arrival date & time: 06/25/17  1245     History   Chief Complaint Chief Complaint  Patient presents with  . Altered Mental Status    HPI [name] is a 83 y.o. female.  Family state patient has becoming more and more confused. She has a history of getting confused when she has a urinary tract infection. She is presently in a dementia unit at a nursing home   The history is provided by a relative. No language interpreter was used.  Altered Mental Status   This is a recurrent problem. The current episode started 2 days ago. The problem has not changed since onset.Associated symptoms include confusion. Risk factors: dementia. Her past medical history does not include seizures.    Past Medical History:  Diagnosis Date  . Aortic insufficiency   . Back pain   . Complete heart block ([place]) 08/31/2013  . Diverticulitis   . Hypertension   . Hypothyroid   . Liver lesion   . Lung nodule   . Mitral regurgitation 12/08/2014  . Rectal polyp   . Sigmoid diverticulitis 04/23/2013   History of March 2014   . 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 UTI (urinary tract infection)   . Villous adenoma of rectum 01/16/2013    Patient Active Problem List   Diagnosis Date Noted  . Aortic atherosclerosis ([place]) 04/13/2017  . Varicose veins of leg with complications   . Bleeding from varicose vein 01/05/2015  . Acute blood loss anemia 01/05/2015  . Stokes-Adams syncope   . Complete heart block ([place])   . Aortic insufficiency   . Hypertension   . Mitral regurgitation 12/08/2014  . Palpitations 11/11/2014  . SOB (shortness of breath) 11/11/2014  . Cardiac pacemaker in situ 07/30/2014  . Adenomatous polyp of rectum 07/30/2014  . Osteopenia of the elderly 03/25/2014  . Systolic ejection murmur 93/79/0240  . History of pacemaker 09/04/2013  . Near syncope 08/29/2013  . Fall at home, with facial injury 08/29/2013  . UTI  (lower urinary tract infection) 08/29/2013  . Sigmoid diverticulitis 04/23/2013  . Hypothyroidism 04/23/2013  . Chronic back pain 04/23/2013  . Villous adenoma of rectum 01/16/2013  . Mass of left lobe of liver 01/16/2013    Past Surgical History:  Procedure Laterality Date  . ABDOMINAL HYSTERECTOMY     still has ovaries  . FLEXIBLE SIGMOIDOSCOPY N/A 01/17/2013   Procedure: FLEXIBLE SIGMOIDOSCOPY;  Surgeon: [name], MD;  Location: [facility];  Service: Gastroenterology;  Laterality: N/A;  . INNER EAR SURGERY  1970's   due to infection  . PACEMAKER PLACEMENT  09/01/2013   [employer]  . PERMANENT PACEMAKER INSERTION N/A 09/01/2013   Procedure: PERMANENT PACEMAKER INSERTION;  Surgeon: [name], MD;  Location: [facility] CATH LAB;  Service: Cardiovascular;  Laterality: N/A;    OB History    No data available       Home Medications    Prior to Admission medications   Medication Sig Start Date End Date Taking? Authorizing Provider  ibuprofen (ADVIL,MOTRIN) 200 MG tablet Take 200 mg by mouth every 6 (six) hours as needed for headache or moderate pain.   Yes [provider]  levothyroxine (SYNTHROID, LEVOTHROID) 75 MCG tablet Take 75 mcg by mouth daily before breakfast.   Yes [provider]  lisinopril (PRINIVIL,ZESTRIL) 2.5 MG tablet Take 1 tablet (2.5 mg total) by mouth daily. 06/13/17  Yes [name], MD  Multiple Vitamin (MULTIVITAMIN WITH MINERALS) TABS Take 1 tablet by mouth daily.   Yes [provider]  Tetrahydrozoline HCl (EYE DROPS OP) Place 2 drops into both eyes as needed (for red or dry eyes). OTC Red eye relief - Use as directed   Yes [provider]  sulfamethoxazole-trimethoprim (BACTRIM DS,SEPTRA DS) 800-160 MG tablet Take 1 tablet by mouth 2 (two) times daily. 06/25/17 07/02/17  Milton Ferguson, MD  SYNTHROID 100 MCG tablet TAKE 1 TABLET DAILY 05/29/17   Chipper Herb, MD    Family History Family History  Problem Relation  Age of Onset  . Heart disease Mother   . Heart disease Father   . Breast cancer Sister   . Cancer Sister        breast  . Diabetes Sister   . Heart disease Sister   . Colon cancer Neg Hx     Social History Social History  Substance Use Topics  . Smoking status: Never Smoker  . Smokeless tobacco: Never Used  . Alcohol use No     Allergies   Patient has no known allergies.   Review of Systems Review of Systems  Unable to perform ROS: Dementia  Psychiatric/Behavioral: Positive for confusion.     Physical Exam Updated Vital Signs BP 126/61 (BP Location: Right Arm)   Pulse 90   Temp (!) 97.5 F (36.4 C) (Oral)   Resp 20   Ht 5\' 6"  (1.676 m)   Wt 53.5 kg (118 lb)   SpO2 97%   BMI 19.05 kg/m   Physical Exam  Constitutional: She appears well-developed.  HENT:  Head: Normocephalic.  Eyes: Conjunctivae and EOM are normal. No scleral icterus.  Neck: Neck supple. No thyromegaly present.  Cardiovascular: Normal rate and regular rhythm.  Exam reveals no gallop and no friction rub.   No murmur heard. Pulmonary/Chest: No stridor. She has no wheezes. She has no rales. She exhibits no tenderness.  Abdominal: She exhibits no distension. There is no tenderness. There is no rebound.  Musculoskeletal: Normal range of motion. She exhibits no edema.  Lymphadenopathy:    She has no cervical adenopathy.  Neurological: She is alert. She exhibits normal muscle tone. Coordination normal.  Patient oriented to person only  Skin: No rash noted. No erythema.     ED Treatments / Results  Labs (all labs ordered are listed, but only abnormal results are displayed) Labs Reviewed  COMPREHENSIVE METABOLIC PANEL - Abnormal; Notable for the following:       Result Value   Sodium 130 (*)    Chloride 100 (*)    Glucose, Bld 116 (*)    BUN 22 (*)    Calcium 8.1 (*)    Total Protein 5.1 (*)    Albumin 2.6 (*)    GFR calc non Af Amer 53 (*)    All other components within normal limits    CBC WITH DIFFERENTIAL/PLATELET - Abnormal; Notable for the following:    WBC 14.3 (*)    RBC 3.05 (*)    Hemoglobin 9.4 (*)    HCT 28.0 (*)    RDW 16.1 (*)    Neutro Abs 12.2 (*)    Monocytes Absolute 1.3 (*)    All other components within normal limits  URINALYSIS, ROUTINE W REFLEX MICROSCOPIC - Abnormal; Notable for the following:    Hgb urine dipstick SMALL (*)    Ketones, ur 5 (*)    Squamous Epithelial / LPF 0-5 (*)    All  other components within normal limits  URINE CULTURE  TSH  I-STAT CG4 LACTIC ACID, ED    EKG  EKG Interpretation None       Radiology Dg Chest 1 View  Result Date: 06/25/2017 CLINICAL DATA:  Altered mental status . EXAM: CHEST 1 VIEW COMPARISON:  08/05/2016. FINDINGS: Cardiac pacer with lead tips in right atrium and right ventricle in stable position. Stable cardiomegaly. Mild bilateral increased interstitial prominence. Small left pleural effusion. Findings suggest mild CHF. Pneumonitis cannot be excluded. No pneumothorax IMPRESSION: 1. Cardiac pacer in stable position. 2. Cardiomegaly with mild increase interstitial markings suggesting mild CHF. Small left pleural effusion. Electronically Signed   By: Marcello Moores  Register   On: 06/25/2017 14:51   Ct Head Wo Contrast  Result Date: 06/25/2017 CLINICAL DATA:  83 year old female with altered mental status. EXAM: CT HEAD WITHOUT CONTRAST TECHNIQUE: Contiguous axial images were obtained from the base of the skull through the vertex without intravenous contrast. COMPARISON:  05/09/2017 FINDINGS: Brain: No evidence of acute infarction, hemorrhage, hydrocephalus, extra-axial collection or mass lesion/mass effect. There is mild parenchymal volume loss and periventricular white matter changes. Vascular: No hyperdense vessel or unexpected calcification. Skull: Normal. Negative for fracture or focal lesion. Sinuses/Orbits: Visualized paranasal sinuses are clear. The orbits are symmetric and intact. IMPRESSION: 1. No acute  intracranial pathology. 2. Stable mild parenchymal volume loss and periventricular white matter changes, likely representing small vessel ischemic disease. Electronically Signed   By: Kristopher Oppenheim M.D.   On: 06/25/2017 17:22    Procedures Procedures (including critical care time)  Medications Ordered in ED Medications  sodium chloride 0.9 % bolus 1,000 mL (0 mLs Intravenous Stopped 06/25/17 1642)  LORazepam (ATIVAN) injection 0.5 mg (0.5 mg Intravenous Given 06/25/17 1520)     Initial Impression / Assessment and Plan / ED Course  I have reviewed the triage vital signs and the nursing notes.  Pertinent labs & imaging results that were available during my care of the patient were reviewed by me and considered in my medical decision making (see chart for details).     Patient with dementia and hematuria. She will be placed on Bactrim a urine culture will be done and she will be seen by her doctor at the nursing home for continued care  Final Clinical Impressions(s) / ED Diagnoses   Final diagnoses:  Hematuria due to acute cystitis    New Prescriptions New Prescriptions   SULFAMETHOXAZOLE-TRIMETHOPRIM (BACTRIM DS,SEPTRA DS) 800-160 MG TABLET    Take 1 tablet by mouth 2 (two) times daily.     Milton Ferguson, MD 06/25/17 [phone]

## 2017-06-25 NOTE — ED Notes (Signed)
Pt's nieces here to be with pt.  Reported pt is not normally this confused.  Pt has memory issues, but not overtly confused as she is now.  Niece has video of pt reading the Bible, writing her name and address, and walking on her phone from just a few weeks ago.  Pt cannot even state where she is or her name at this time.  Concerned pt has uti or something else occurring.  Pt has had 2 falls recently.

## 2017-06-25 NOTE — ED Triage Notes (Signed)
EMS reports pt sent from [facility] center for evaluation of altered mental status.  States has been increasing for the past few days.  Blood and urine obtained this morning at facility, but never tested.

## 2017-06-25 NOTE — ED Notes (Signed)
Pt clothes and black purse were sent back with pt to [facility] via EMS transport (in pt belonging bag with pt label)  Per niece Rosalyn Gess, pt had money in purse that [place] said she had already removed besides a few dollars and she wanted to send purse back with pt because she is not going back to facility tonight.

## 2017-06-27 DIAGNOSIS — Z79899 Other long term (current) drug therapy: Secondary | ICD-10-CM | POA: Diagnosis not present

## 2017-06-27 LAB — URINE CULTURE: Culture: NO GROWTH

## 2017-07-03 DIAGNOSIS — E039 Hypothyroidism, unspecified: Secondary | ICD-10-CM | POA: Diagnosis not present

## 2017-07-03 DIAGNOSIS — Z79899 Other long term (current) drug therapy: Secondary | ICD-10-CM | POA: Diagnosis not present

## 2017-07-03 DIAGNOSIS — E559 Vitamin D deficiency, unspecified: Secondary | ICD-10-CM | POA: Diagnosis not present

## 2017-07-19 DIAGNOSIS — F0281 Dementia in other diseases classified elsewhere with behavioral disturbance: Secondary | ICD-10-CM | POA: Diagnosis not present

## 2017-07-19 DIAGNOSIS — G301 Alzheimer's disease with late onset: Secondary | ICD-10-CM | POA: Diagnosis not present

## 2017-07-19 DIAGNOSIS — E871 Hypo-osmolality and hyponatremia: Secondary | ICD-10-CM | POA: Diagnosis not present

## 2017-07-19 DIAGNOSIS — F4323 Adjustment disorder with mixed anxiety and depressed mood: Secondary | ICD-10-CM | POA: Diagnosis not present

## 2017-07-26 DIAGNOSIS — F0281 Dementia in other diseases classified elsewhere with behavioral disturbance: Secondary | ICD-10-CM | POA: Diagnosis not present

## 2017-07-26 DIAGNOSIS — E871 Hypo-osmolality and hyponatremia: Secondary | ICD-10-CM | POA: Diagnosis not present

## 2017-07-26 DIAGNOSIS — F4323 Adjustment disorder with mixed anxiety and depressed mood: Secondary | ICD-10-CM | POA: Diagnosis not present

## 2017-07-26 DIAGNOSIS — G301 Alzheimer's disease with late onset: Secondary | ICD-10-CM | POA: Diagnosis not present

## 2017-07-30 ENCOUNTER — Emergency Department (HOSPITAL_COMMUNITY): Payer: Medicare Other

## 2017-07-30 ENCOUNTER — Inpatient Hospital Stay (HOSPITAL_COMMUNITY)
Admission: EM | Admit: 2017-07-30 | Discharge: 2017-08-02 | DRG: 070 | Disposition: A | Discharge status: Hospice | Payer: Medicare Other | Attending: Internal Medicine | Admitting: Internal Medicine

## 2017-07-30 ENCOUNTER — Encounter (HOSPITAL_COMMUNITY): Payer: Self-pay | Admitting: Emergency Medicine

## 2017-07-30 DIAGNOSIS — R54 Age-related physical debility: Secondary | ICD-10-CM | POA: Diagnosis present

## 2017-07-30 DIAGNOSIS — R410 Disorientation, unspecified: Secondary | ICD-10-CM

## 2017-07-30 DIAGNOSIS — F0280 Dementia in other diseases classified elsewhere without behavioral disturbance: Secondary | ICD-10-CM | POA: Diagnosis present

## 2017-07-30 DIAGNOSIS — I5031 Acute diastolic (congestive) heart failure: Secondary | ICD-10-CM

## 2017-07-30 DIAGNOSIS — E876 Hypokalemia: Secondary | ICD-10-CM | POA: Diagnosis present

## 2017-07-30 DIAGNOSIS — I509 Heart failure, unspecified: Secondary | ICD-10-CM | POA: Diagnosis not present

## 2017-07-30 DIAGNOSIS — J90 Pleural effusion, not elsewhere classified: Secondary | ICD-10-CM | POA: Diagnosis present

## 2017-07-30 DIAGNOSIS — R748 Abnormal levels of other serum enzymes: Secondary | ICD-10-CM | POA: Diagnosis not present

## 2017-07-30 DIAGNOSIS — E039 Hypothyroidism, unspecified: Secondary | ICD-10-CM | POA: Diagnosis present

## 2017-07-30 DIAGNOSIS — J9600 Acute respiratory failure, unspecified whether with hypoxia or hypercapnia: Secondary | ICD-10-CM | POA: Diagnosis not present

## 2017-07-30 DIAGNOSIS — I340 Nonrheumatic mitral (valve) insufficiency: Secondary | ICD-10-CM

## 2017-07-30 DIAGNOSIS — Z66 Do not resuscitate: Secondary | ICD-10-CM | POA: Diagnosis present

## 2017-07-30 DIAGNOSIS — I272 Pulmonary hypertension, unspecified: Secondary | ICD-10-CM | POA: Diagnosis present

## 2017-07-30 DIAGNOSIS — G9340 Encephalopathy, unspecified: Secondary | ICD-10-CM | POA: Diagnosis not present

## 2017-07-30 DIAGNOSIS — R7989 Other specified abnormal findings of blood chemistry: Secondary | ICD-10-CM | POA: Diagnosis not present

## 2017-07-30 DIAGNOSIS — I38 Endocarditis, valve unspecified: Secondary | ICD-10-CM

## 2017-07-30 DIAGNOSIS — G309 Alzheimer's disease, unspecified: Secondary | ICD-10-CM | POA: Diagnosis present

## 2017-07-30 DIAGNOSIS — I700 Atherosclerosis of aorta: Secondary | ICD-10-CM | POA: Diagnosis present

## 2017-07-30 DIAGNOSIS — Z23 Encounter for immunization: Secondary | ICD-10-CM | POA: Diagnosis not present

## 2017-07-30 DIAGNOSIS — I110 Hypertensive heart disease with heart failure: Secondary | ICD-10-CM | POA: Diagnosis present

## 2017-07-30 DIAGNOSIS — I083 Combined rheumatic disorders of mitral, aortic and tricuspid valves: Secondary | ICD-10-CM | POA: Diagnosis present

## 2017-07-30 DIAGNOSIS — F22 Delusional disorders: Secondary | ICD-10-CM | POA: Diagnosis not present

## 2017-07-30 DIAGNOSIS — R778 Other specified abnormalities of plasma proteins: Secondary | ICD-10-CM

## 2017-07-30 DIAGNOSIS — I5033 Acute on chronic diastolic (congestive) heart failure: Secondary | ICD-10-CM | POA: Diagnosis not present

## 2017-07-30 DIAGNOSIS — F05 Delirium due to known physiological condition: Secondary | ICD-10-CM | POA: Diagnosis not present

## 2017-07-30 DIAGNOSIS — R0600 Dyspnea, unspecified: Secondary | ICD-10-CM | POA: Diagnosis not present

## 2017-07-30 DIAGNOSIS — F419 Anxiety disorder, unspecified: Secondary | ICD-10-CM | POA: Diagnosis not present

## 2017-07-30 DIAGNOSIS — R52 Pain, unspecified: Secondary | ICD-10-CM | POA: Diagnosis not present

## 2017-07-30 DIAGNOSIS — S066X9A Traumatic subarachnoid hemorrhage with loss of consciousness of unspecified duration, initial encounter: Secondary | ICD-10-CM | POA: Diagnosis present

## 2017-07-30 DIAGNOSIS — Z7189 Other specified counseling: Secondary | ICD-10-CM | POA: Diagnosis not present

## 2017-07-30 DIAGNOSIS — Z515 Encounter for palliative care: Secondary | ICD-10-CM

## 2017-07-30 DIAGNOSIS — I5030 Unspecified diastolic (congestive) heart failure: Secondary | ICD-10-CM | POA: Diagnosis not present

## 2017-07-30 DIAGNOSIS — E43 Unspecified severe protein-calorie malnutrition: Secondary | ICD-10-CM | POA: Diagnosis not present

## 2017-07-30 DIAGNOSIS — R4182 Altered mental status, unspecified: Secondary | ICD-10-CM | POA: Diagnosis not present

## 2017-07-30 DIAGNOSIS — X58XXXA Exposure to other specified factors, initial encounter: Secondary | ICD-10-CM | POA: Diagnosis present

## 2017-07-30 DIAGNOSIS — G9341 Metabolic encephalopathy: Secondary | ICD-10-CM | POA: Diagnosis not present

## 2017-07-30 DIAGNOSIS — E860 Dehydration: Secondary | ICD-10-CM | POA: Diagnosis present

## 2017-07-30 DIAGNOSIS — R0602 Shortness of breath: Secondary | ICD-10-CM | POA: Diagnosis not present

## 2017-07-30 DIAGNOSIS — R911 Solitary pulmonary nodule: Secondary | ICD-10-CM | POA: Diagnosis present

## 2017-07-30 DIAGNOSIS — M1000 Idiopathic gout, unspecified site: Secondary | ICD-10-CM | POA: Diagnosis not present

## 2017-07-30 DIAGNOSIS — Z743 Need for continuous supervision: Secondary | ICD-10-CM | POA: Diagnosis not present

## 2017-07-30 DIAGNOSIS — R279 Unspecified lack of coordination: Secondary | ICD-10-CM | POA: Diagnosis not present

## 2017-07-30 DIAGNOSIS — F0390 Unspecified dementia without behavioral disturbance: Secondary | ICD-10-CM | POA: Diagnosis present

## 2017-07-30 DIAGNOSIS — K769 Liver disease, unspecified: Secondary | ICD-10-CM | POA: Diagnosis present

## 2017-07-30 DIAGNOSIS — Z950 Presence of cardiac pacemaker: Secondary | ICD-10-CM

## 2017-07-30 DIAGNOSIS — E46 Unspecified protein-calorie malnutrition: Secondary | ICD-10-CM | POA: Diagnosis not present

## 2017-07-30 DIAGNOSIS — I609 Nontraumatic subarachnoid hemorrhage, unspecified: Secondary | ICD-10-CM | POA: Diagnosis not present

## 2017-07-30 DIAGNOSIS — Z681 Body mass index (BMI) 19 or less, adult: Secondary | ICD-10-CM

## 2017-07-30 DIAGNOSIS — I351 Nonrheumatic aortic (valve) insufficiency: Secondary | ICD-10-CM | POA: Diagnosis not present

## 2017-07-30 DIAGNOSIS — F23 Brief psychotic disorder: Secondary | ICD-10-CM | POA: Diagnosis not present

## 2017-07-30 LAB — CBC WITH DIFFERENTIAL/PLATELET
BASOS PCT: 0 %
Basophils Absolute: 0 10*3/uL (ref 0.0–0.1)
Eosinophils Absolute: 0 10*3/uL (ref 0.0–0.7)
Eosinophils Relative: 0 %
HEMATOCRIT: 27.5 % — AB (ref 36.0–46.0)
HEMOGLOBIN: 9.1 g/dL — AB (ref 12.0–15.0)
LYMPHS ABS: 0.9 10*3/uL (ref 0.7–4.0)
LYMPHS PCT: 9 %
MCH: 31.7 pg (ref 26.0–34.0)
MCHC: 33.1 g/dL (ref 30.0–36.0)
MCV: 95.8 fL (ref 78.0–100.0)
MONO ABS: 1 10*3/uL (ref 0.1–1.0)
MONOS PCT: 10 %
NEUTROS ABS: 8 10*3/uL — AB (ref 1.7–7.7)
NEUTROS PCT: 81 %
Platelets: 220 10*3/uL (ref 150–400)
RBC: 2.87 MIL/uL — ABNORMAL LOW (ref 3.87–5.11)
RDW: 20 % — AB (ref 11.5–15.5)
WBC: 9.8 10*3/uL (ref 4.0–10.5)

## 2017-07-30 LAB — BLOOD GAS, ARTERIAL
Acid-base deficit: 1.5 mmol/L (ref 0.0–2.0)
Bicarbonate: 23.6 mmol/L (ref 20.0–28.0)
Drawn by: 23534
FIO2: 0.21
O2 CONTENT: 21 L/min
O2 Saturation: 96.9 %
PCO2 ART: 29.2 mmHg — AB (ref 32.0–48.0)
PH ART: 7.481 — AB (ref 7.350–7.450)
PO2 ART: 85.2 mmHg (ref 83.0–108.0)

## 2017-07-30 LAB — BRAIN NATRIURETIC PEPTIDE: B NATRIURETIC PEPTIDE 5: 1095 pg/mL — AB (ref 0.0–100.0)

## 2017-07-30 LAB — TROPONIN I
TROPONIN I: 0.32 ng/mL — AB (ref <0.03)
Troponin I: 0.4 ng/mL (ref <0.03)

## 2017-07-30 LAB — URINALYSIS, ROUTINE W REFLEX MICROSCOPIC
Glucose, UA: NEGATIVE mg/dL
KETONES UR: 20 mg/dL — AB
LEUKOCYTES UA: NEGATIVE
NITRITE: NEGATIVE
PH: 5 (ref 5.0–8.0)
Protein, ur: 30 mg/dL — AB
SPECIFIC GRAVITY, URINE: 1.023 (ref 1.005–1.030)

## 2017-07-30 LAB — COMPREHENSIVE METABOLIC PANEL
ALBUMIN: 2.7 g/dL — AB (ref 3.5–5.0)
ALT: 13 U/L — ABNORMAL LOW (ref 14–54)
ANION GAP: 11 (ref 5–15)
AST: 23 U/L (ref 15–41)
Alkaline Phosphatase: 92 U/L (ref 38–126)
BILIRUBIN TOTAL: 1.1 mg/dL (ref 0.3–1.2)
BUN: 27 mg/dL — ABNORMAL HIGH (ref 6–20)
CO2: 23 mmol/L (ref 22–32)
Calcium: 8.8 mg/dL — ABNORMAL LOW (ref 8.9–10.3)
Chloride: 100 mmol/L — ABNORMAL LOW (ref 101–111)
Creatinine, Ser: 0.81 mg/dL (ref 0.44–1.00)
GFR calc non Af Amer: >60 mL/min (ref >60)
GLUCOSE: 107 mg/dL — AB (ref 65–99)
Potassium: 3.3 mmol/L — ABNORMAL LOW (ref 3.5–5.1)
Sodium: 134 mmol/L — ABNORMAL LOW (ref 135–145)
TOTAL PROTEIN: 5.3 g/dL — AB (ref 6.5–8.1)

## 2017-07-30 LAB — D-DIMER, QUANTITATIVE: D-Dimer, Quant: 1.85 ug/mL-FEU — ABNORMAL HIGH (ref 0.00–0.50)

## 2017-07-30 LAB — AMMONIA: Ammonia: 23 umol/L (ref 9–35)

## 2017-07-30 LAB — TSH: TSH: 9.661 u[IU]/mL — ABNORMAL HIGH (ref 0.350–4.500)

## 2017-07-30 LAB — T4, FREE: Free T4: 0.97 ng/dL (ref 0.61–1.12)

## 2017-07-30 LAB — LIPASE, BLOOD: Lipase: 28 U/L (ref 11–51)

## 2017-07-30 MED ORDER — HEPARIN SODIUM (PORCINE) 5000 UNIT/ML IJ SOLN
5000.0000 [IU] | Freq: Three times a day (TID) | INTRAMUSCULAR | Status: DC
  Administered 2017-07-30 – 2017-07-31 (×3): 5000 [IU] via SUBCUTANEOUS
  Filled 2017-07-30 (×3): qty 1

## 2017-07-30 MED ORDER — LEVOTHYROXINE SODIUM 75 MCG PO TABS
150.0000 ug | ORAL_TABLET | ORAL | Status: DC
  Administered 2017-07-30 – 2017-07-31 (×2): 150 ug via ORAL
  Filled 2017-07-30: qty 2
  Filled 2017-07-30: qty 3

## 2017-07-30 MED ORDER — ASPIRIN EC 81 MG PO TBEC
81.0000 mg | DELAYED_RELEASE_TABLET | Freq: Every day | ORAL | Status: DC
  Administered 2017-07-30 – 2017-07-31 (×2): 81 mg via ORAL
  Filled 2017-07-30 (×2): qty 1

## 2017-07-30 MED ORDER — PNEUMOCOCCAL VAC POLYVALENT 25 MCG/0.5ML IJ INJ
0.5000 mL | INJECTION | INTRAMUSCULAR | Status: AC
Start: 2017-07-31 — End: 2017-07-31
  Administered 2017-07-31: 0.5 mL via INTRAMUSCULAR
  Filled 2017-07-30: qty 0.5

## 2017-07-30 MED ORDER — LORAZEPAM 2 MG/ML IJ SOLN
0.5000 mg | INTRAMUSCULAR | Status: DC | PRN
  Filled 2017-07-30: qty 1

## 2017-07-30 MED ORDER — SODIUM CHLORIDE 0.9 % IV SOLN
Freq: Once | INTRAVENOUS | Status: AC
  Administered 2017-07-30: 14:00:00 via INTRAVENOUS

## 2017-07-30 MED ORDER — CHLORHEXIDINE GLUCONATE 0.12 % MT SOLN
15.0000 mL | Freq: Two times a day (BID) | OROMUCOSAL | Status: DC
  Filled 2017-07-30: qty 15

## 2017-07-30 MED ORDER — HALOPERIDOL LACTATE 5 MG/ML IJ SOLN
2.5000 mg | Freq: Once | INTRAMUSCULAR | Status: AC
  Administered 2017-07-30: 2.5 mg via INTRAVENOUS
  Filled 2017-07-30: qty 1

## 2017-07-30 MED ORDER — ORAL CARE MOUTH RINSE: 15.0000 mL | Freq: Two times a day (BID) | OROMUCOSAL | Status: DC

## 2017-07-30 MED ORDER — IOPAMIDOL (ISOVUE-370) INJECTION 76%
100.0000 mL | Freq: Once | INTRAVENOUS | Status: AC | PRN
  Administered 2017-07-30: 100 mL via INTRAVENOUS

## 2017-07-30 MED ORDER — LORAZEPAM 2 MG/ML IJ SOLN
2.0000 mg | INTRAMUSCULAR | Status: DC | PRN
  Administered 2017-07-31 – 2017-08-01 (×2): 2 mg via INTRAVENOUS
  Filled 2017-07-30 (×2): qty 1

## 2017-07-30 MED ORDER — INFLUENZA VAC SPLIT HIGH-DOSE 0.5 ML IM SUSY
0.5000 mL | PREFILLED_SYRINGE | INTRAMUSCULAR | Status: AC
  Administered 2017-07-31: 0.5 mL via INTRAMUSCULAR
  Filled 2017-07-30: qty 0.5

## 2017-07-30 MED ORDER — SODIUM CHLORIDE 0.9 % IV SOLN
INTRAVENOUS | Status: DC
  Administered 2017-07-31: 05:00:00 via INTRAVENOUS

## 2017-07-30 NOTE — ED Provider Notes (Signed)
[facility] DEPT Provider Note   CSN: [id number] Arrival date & time: 07/30/17  1244     History   Chief Complaint Chief Complaint  Patient presents with  . Altered Mental Status    HPI [name] is a 83 y.o. female. Chief complaint is "I can't breathe". Also agitation, change in behavior, and yelling.  HPI:  This is an 83 year old female who resides at [facility]. She has been there since July. She has a custodian that makes medical decision-making for her. This is not a current family member. She was seen and evaluated 8/27. Had hematuria. Diagnosed with, and treated for cystitis. Ultimately cultures were negative.  She's had progressive decline of her mental function over the last week. Was seen by family yesterday. They state that she was unable to drink or feed herself. She was agitated and confused. They state that this is quite different for her even over the past 3-4 days.    Past Medical History:  Diagnosis Date  . Aortic insufficiency   . Back pain   . Complete heart block ([place]) 08/31/2013  . Diverticulitis   . Hypertension   . Hypothyroid   . Liver lesion   . Lung nodule   . Mitral regurgitation 12/08/2014  . Rectal polyp   . Sigmoid diverticulitis 04/23/2013   History of March 2014   . 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 UTI (urinary tract infection)   . Villous adenoma of rectum 01/16/2013    Patient Active Problem List   Diagnosis Date Noted  . Aortic atherosclerosis ([place]) 04/13/2017  . Varicose veins of leg with complications   . Bleeding from varicose vein 01/05/2015  . Acute blood loss anemia 01/05/2015  . Stokes-Adams syncope   . Complete heart block ([place])   . Aortic insufficiency   . Hypertension   . Mitral regurgitation 12/08/2014  . Palpitations 11/11/2014  . SOB (shortness of breath) 11/11/2014  . Cardiac pacemaker in situ 07/30/2014  . Adenomatous polyp of rectum 07/30/2014  . Osteopenia of the elderly  03/25/2014  . Systolic ejection murmur 25/85/2778  . History of pacemaker 09/04/2013  . Near syncope 08/29/2013  . Fall at home, with facial injury 08/29/2013  . UTI (lower urinary tract infection) 08/29/2013  . Sigmoid diverticulitis 04/23/2013  . Hypothyroidism 04/23/2013  . Chronic back pain 04/23/2013  . Villous adenoma of rectum 01/16/2013  . Mass of left lobe of liver 01/16/2013    Past Surgical History:  Procedure Laterality Date  . ABDOMINAL HYSTERECTOMY     still has ovaries  . FLEXIBLE SIGMOIDOSCOPY N/A 01/17/2013   Procedure: FLEXIBLE SIGMOIDOSCOPY;  Surgeon: [name], MD;  Location: [facility];  Service: Gastroenterology;  Laterality: N/A;  . INNER EAR SURGERY  1970's   due to infection  . PACEMAKER PLACEMENT  09/01/2013   [employer]  . PERMANENT PACEMAKER INSERTION N/A 09/01/2013   Procedure: PERMANENT PACEMAKER INSERTION;  Surgeon: [name], MD;  Location: [facility] CATH LAB;  Service: Cardiovascular;  Laterality: N/A;    OB History    No data available       Home Medications    Prior to Admission medications   Medication Sig Start Date End Date Taking? Authorizing Provider  diclofenac (VOLTAREN) 75 MG EC tablet Take 75 mg by mouth 2 (two) times daily.   Yes [provider]  levothyroxine (SYNTHROID, LEVOTHROID) 88 MCG tablet Take 88 mcg by mouth daily before breakfast.   Yes [provider]  LORazepam (ATIVAN) 0.5 MG tablet Take 0.5 mg by mouth 2 (two) times daily.   Yes [provider]  meloxicam (MOBIC) 15 MG tablet Take 15 mg by mouth every evening.   Yes [provider]  Multiple Vitamin (MULTIVITAMIN WITH MINERALS) TABS Take 1 tablet by mouth daily.   Yes [provider]  oxyCODONE (OXY IR/ROXICODONE) 5 MG immediate release tablet Take 5 mg by mouth every 6 (six) hours as needed for severe pain.   Yes [provider]  sertraline (ZOLOFT) 100 MG tablet Take 100 mg by mouth daily.   Yes [provider]  sodium chloride (OCEAN) 0.65 % SOLN nasal spray Place 1 spray into both nostrils as needed for congestion.   Yes [provider]  lisinopril (PRINIVIL,ZESTRIL) 2.5 MG tablet Take 1 tablet (2.5 mg total) by mouth daily. Patient not taking: Reported on 07/30/2017 06/13/17   [name], MD    Family History Family History  Problem Relation Age of Onset  . Heart disease Mother   . Heart disease Father   . Breast cancer Sister   . Cancer Sister        breast  . Diabetes Sister   . Heart disease Sister   . Colon cancer Neg Hx     Social History Social History  Substance Use Topics  . Smoking status: Never Smoker  . Smokeless tobacco: Never Used  . Alcohol use No     Allergies   Patient has no known allergies.   Review of Systems Review of Systems  Unable to perform ROS: Mental status change   level V caveat. Patient is confused. At times and appreciable speech. Yelling. Agitated.   Physical Exam Updated Vital Signs BP (!) 119/53   Pulse 79   Resp 18   Ht 5\' 6"  (1.676 m)   Wt 49.9 kg (110 lb)   SpO2 100%   BMI 17.75 kg/m   Physical Exam  Constitutional: She is oriented to person, place, and time. She appears distressed.  Thin and frail. Eyes closed. Yelling.  HENT:  Head: Normocephalic.  Eyes: Pupils are equal, round, and reactive to light. Conjunctivae are normal. No scleral icterus.  Neck: Normal range of motion. Neck supple. No thyromegaly present.  Cardiovascular: Exam reveals no gallop and no friction rub.   No murmur heard. Pulmonary/Chest: No respiratory distress. She has no wheezes. She has no rales.  Crackles throughout her lungs. No marked increased work of breathing.  Abdominal: Soft. Bowel sounds are normal. She exhibits no distension. There is no tenderness. There is no rebound.  Musculoskeletal: Normal range of motion.  Neurological: She is alert and oriented to person, place, and time.  Skin: Skin is warm and dry. No  rash noted.  No marked edema to the lower extremities. No brawniness or induration   Psychiatric:  Agitated     ED Treatments / Results  Labs (all labs ordered are listed, but only abnormal results are displayed) Labs Reviewed  CBC WITH DIFFERENTIAL/PLATELET - Abnormal; Notable for the following:       Result Value   RBC 2.87 (*)    Hemoglobin 9.1 (*)    HCT 27.5 (*)    RDW 20.0 (*)    Neutro Abs 8.0 (*)    All other components within normal limits  COMPREHENSIVE METABOLIC PANEL - Abnormal; Notable for the following:    Sodium 134 (*)    Potassium 3.3 (*)    Chloride 100 (*)  Glucose, Bld 107 (*)    BUN 27 (*)    Calcium 8.8 (*)    Total Protein 5.3 (*)    Albumin 2.7 (*)    ALT 13 (*)    All other components within normal limits  URINALYSIS, ROUTINE W REFLEX MICROSCOPIC - Abnormal; Notable for the following:    Color, Urine AMBER (*)    APPearance HAZY (*)    Hgb urine dipstick LARGE (*)    Bilirubin Urine SMALL (*)    Ketones, ur 20 (*)    Protein, ur 30 (*)    Bacteria, UA RARE (*)    Squamous Epithelial / LPF 0-5 (*)    All other components within normal limits  TSH - Abnormal; Notable for the following:    TSH 9.661 (*)    All other components within normal limits  TROPONIN I - Abnormal; Notable for the following:    Troponin I 0.40 (*)    All other components within normal limits  BRAIN NATRIURETIC PEPTIDE - Abnormal; Notable for the following:    B Natriuretic Peptide 1,095.0 (*)    All other components within normal limits  URINE CULTURE  LIPASE, BLOOD    EKG  EKG Interpretation None       Radiology Dg Chest Port 1 View  Result Date: 07/30/2017 CLINICAL DATA:  Patient with altered mental status. Shortness of breath. EXAM: PORTABLE CHEST 1 VIEW COMPARISON:  Chest radiograph 06/25/2017. FINDINGS: Multi lead pacer apparatus overlies the left hemithorax. Monitoring leads overlie the patient. Stable cardiomegaly. Aortic atherosclerosis. Pulmonary  vascular redistribution. Bilateral interstitial pulmonary opacities. Small left effusion. No pneumothorax. IMPRESSION: Pulmonary vascular redistribution and findings suggestive of edema with small left effusion. Electronically Signed   By: Lovey Newcomer M.D.   On: 07/30/2017 13:18    Procedures Procedures (including critical care time)  Medications Ordered in ED Medications  haloperidol lactate (HALDOL) injection 2.5 mg (not administered)  0.9 %  sodium chloride infusion ( Intravenous New Bag/Given 07/30/17 1353)     Initial Impression / Assessment and Plan / ED Course  I have reviewed the triage vital signs and the nursing notes.  Pertinent labs & imaging results that were available during my care of the patient were reviewed by me and considered in my medical decision making (see chart for details).   urine obtained, and does not show obvious UTI although some hematuria. Negative nitrate. Negative esterase. Culture pending.  KG attempted. She is in a paced rhythm. Marked artifact. This will be repeated. He be given some Haldol here. Has had elevation of her TSH over the last few months after being normal the summer. She is being supplemented with 88 g daily. Chest x-ray suggests interstitial fluid and effusion and pulmonary edema. BNP greater than 1000. Troponin 0.4.   Her delirium, CHF exacerbation, may be result of her hypothyroidism. With her confusion and agitation do not feel should be able to sustain herself medically with by mouth intake. His been able to eat or drink here. Does not appear markedly dehydrated per labs. Does have dry mucous membranes. Pink. Discussed with hospitalist regarding ongoing plan of care for her at this point.   Final Clinical Impressions(s) / ED Diagnoses   Final diagnoses:  Hypothyroidism, unspecified type  Congestive heart failure, unspecified HF chronicity, unspecified heart failure type ([place])  Delirium due to another medical condition  Troponin  level elevated    New Prescriptions New Prescriptions   No medications on file  Tanna Furry, MD 07/30/17 [phone]

## 2017-07-30 NOTE — ED Triage Notes (Signed)
Pt sent from the [facility] for agitation and yelling.  Reported this is worse than usual.  Pt exhibiting same behavior as last ED visit.

## 2017-07-30 NOTE — Consult Note (Signed)
Cardiology Consultation:   Patient ID: [name] [facility]; [id number]; 1928-08-01   Admit date: 07/30/2017 Date of Consult: 07/30/2017  Primary Care Provider: Chipper Herb, MD Primary Cardiologist: Dr. Lovena Le Primary Electrophysiologist:  Dr. Lovena Le   Patient Profile:   [name] is a 83 y.o. female with a hx of complete heart block s/p pacemaker and severe mitral regurgitation who is being seen today for the evaluation of CHF and elevated troponin at the request of Dr. Ernestina Patches.  History of Present Illness:   [name] is a 83 y.o. female with a hx of complete heart block s/p pacemaker and severe mitral regurgitation who is being seen today for the evaluation of CHF and elevated troponin at the request of Dr. Ernestina Patches. [name] currently resides at the [facility] and was sent here due to agitation and delirium. [name] has a history of dementia.  Two nieces are present in the room and provide most of the history.  [name] had apparently been well a few months ago and was driving and is generally upbeat. [name] son handed [name] care over to a [place] in [place].  [name] was evaluated in the ED on 06/25/17 for altered mental status. At that time [name] was treated for a UTI.  Today, [name] ECG which I personally interpreted shows atrial sensing and ventricular pacing with PVC's.  Chest xray shows some degree of pulmonary edema with a small left pleural effusion.  UA is negative for nitrites and leukocytes.  WBC is normal. [name] is anemic, Hgb 9.1.  There is mild hypokalemia, K 3.3.  BUN 27, creatinine 0.81 (BUN 22 on 8/27).  TSH indicates hypothyroidism which is not ideally controlled, 9.66.  Troponin elevated, 0.4.  BNP elevated 1,095.  D-dimer elevated, 1.85.  Ammonia normal at 23.      Past Medical History:  Diagnosis Date  . Aortic insufficiency   . Back pain   . Complete heart block ([place]) 08/31/2013  . Diverticulitis   . Hypertension   . Hypothyroid   . Liver lesion     . Lung nodule   . Mitral regurgitation 12/08/2014  . Rectal polyp   . Sigmoid diverticulitis 04/23/2013   History of March 2014   . 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 UTI (urinary tract infection)   . Villous adenoma of rectum 01/16/2013    Past Surgical History:  Procedure Laterality Date  . ABDOMINAL HYSTERECTOMY     still has ovaries  . FLEXIBLE SIGMOIDOSCOPY N/A 01/17/2013   Procedure: FLEXIBLE SIGMOIDOSCOPY;  Surgeon: [name], MD;  Location: [facility];  Service: Gastroenterology;  Laterality: N/A;  . INNER EAR SURGERY  1970's   due to infection  . PACEMAKER PLACEMENT  09/01/2013   [employer]  . PERMANENT PACEMAKER INSERTION N/A 09/01/2013   Procedure: PERMANENT PACEMAKER INSERTION;  Surgeon: [name], MD;  Location: [facility] CATH LAB;  Service: Cardiovascular;  Laterality: N/A;       Inpatient Medications: Scheduled Meds: . aspirin EC  81 mg Oral Daily  . heparin  5,000 Units Subcutaneous Q8H   Continuous Infusions: . sodium chloride     PRN Meds:   Allergies:   No Known Allergies  Social History:   Social History   Social History  . Marital status: Widowed    Spouse name: N/A  . Number of children: 1  . Years of education: N/A   Occupational History  . retired    Social History Main Topics  .  Smoking status: Never Smoker  . Smokeless tobacco: Never Used  . Alcohol use No  . Drug use: No  . Sexual activity: Not on file   Other Topics Concern  . Not on file   Social History Narrative  . No narrative on file    Family History:    Family History  Problem Relation Age of Onset  . Heart disease Mother   . Heart disease Father   . Breast cancer Sister   . Cancer Sister        breast  . Diabetes Sister   . Heart disease Sister   . Colon cancer Neg Hx      ROS:  ROS  As the patient is delirious, I am unable to obtain.    Physical Exam/Data:   Vitals:   07/30/17 1500 07/30/17 1530 07/30/17 1600 07/30/17 1630  BP:  (!) 115/50 (!) 119/53 114/65 (!) 122/57  Pulse: 74 79 80   Resp: 20 18 17 11   SpO2: 100% 100% 100%   Weight:      Height:       No intake or output data in the 24 hours ending 07/30/17 1649 Filed Weights   07/30/17 1247  Weight: 110 lb (49.9 kg)   Body mass index is 17.75 kg/m.  General:  Agitated. HEENT: Dry mucous membranes. Lymph: no adenopathy Neck: no JVD Endocrine:  No thryomegaly Cardiac:  normal S1, S2; RRR; 3/6 apical holosystolic murmur Lungs:  Mildly diminished  Abd: soft, nontender, no hepatomegaly  Ext: no edema Musculoskeletal:  No deformities Skin: warm and dry  Neuro:  Delirious Psych:  Delirious    Relevant CV Studies:  Echocardiogram 12/15/16:  Study Conclusions  - Left ventricle: The cavity size was normal. There was mild   concentric hypertrophy. Systolic function was normal. The   estimated ejection fraction was in the range of 60% to 65%. Wall   motion was normal; there were no regional wall motion   abnormalities. Doppler parameters are consistent with abnormal   left ventricular relaxation (grade 1 diastolic dysfunction).   Doppler parameters are consistent with elevated ventricular   end-diastolic filling pressure. - Aortic valve: There was moderate regurgitation. - Mitral valve: Calcified annulus. Mildly thickened leaflets .   There was severe regurgitation directed anteriorly. - Left atrium: The atrium was mildly dilated. - Right atrium: The atrium was mildly dilated. - Tricuspid valve: There was moderate regurgitation. - Pulmonary arteries: Systolic pressure was mildly increased. PA   peak pressure: 37 mm Hg (S).  Impressions:  - There is no significant change since the prior study on   11/26/2014, LVEF remains normal, MR is severe. There is mild   pulmonary hypertension.  Laboratory Data:  Chemistry Recent Labs Lab 07/30/17 1306  NA 134*  K 3.3*  CL 100*  CO2 23  GLUCOSE 107*  BUN 27*  CREATININE 0.81  CALCIUM 8.8*    GFRNONAA >60  GFRAA >60  ANIONGAP 11     Recent Labs Lab 07/30/17 1306  PROT 5.3*  ALBUMIN 2.7*  AST 23  ALT 13*  ALKPHOS 92  BILITOT 1.1   Hematology Recent Labs Lab 07/30/17 1306  WBC 9.8  RBC 2.87*  HGB 9.1*  HCT 27.5*  MCV 95.8  MCH 31.7  MCHC 33.1  RDW 20.0*  PLT 220   Cardiac Enzymes Recent Labs Lab 07/30/17 1358  TROPONINI 0.40*   No results for input(s): TROPIPOC in the last 168 hours.  BNP Recent Labs Lab 07/30/17  1358  BNP 1,095.0*    DDimer  Recent Labs Lab 07/30/17 1358  DDIMER 1.85*    Radiology/Studies:  Dg Chest Port 1 View  Result Date: 07/30/2017 CLINICAL DATA:  Patient with altered mental status. Shortness of breath. EXAM: PORTABLE CHEST 1 VIEW COMPARISON:  Chest radiograph 06/25/2017. FINDINGS: Multi lead pacer apparatus overlies the left hemithorax. Monitoring leads overlie the patient. Stable cardiomegaly. Aortic atherosclerosis. Pulmonary vascular redistribution. Bilateral interstitial pulmonary opacities. Small left effusion. No pneumothorax. IMPRESSION: Pulmonary vascular redistribution and findings suggestive of edema with small left effusion. Electronically Signed   By: Lovey Newcomer M.D.   On: 07/30/2017 13:18    Assessment and Plan:   1. Acute on chronic diastolic heart failure: [name] has a mild degree of pulmonary vascular congestion. However, BUN/creatinine ratio would indicate [name] is intravascularly dry. BNP is elevated due to severe mitral regurgitation. Unfortunately, due to [name] age, frailty, and comorbidities, [name] is not an ideal operative candidate for mitral valve repair and will always be at risk for diastolic decompensation. I would consider gentle hydration to see if this helps to improve [name] altered sensorium. Should [name] develop more frank pulmonary edema, would be forced to give IV diuresis.  2. Elevated troponin: I would treat [name] medically with ASA for now. Can trend serial troponins but I would have a high  threshold for coronary angiography.  3. Valvular heart disease with severe mitral and moderate aortic and tricuspid regurgitation: [name] has been managed conservative and given [name] age and frailty, I think this is the best approach.  BNP is elevated due to severe mitral regurgitation. Unfortunately, due to [name] age, frailty, and comorbidities, [name] is not an ideal operative candidate for mitral valve repair and will always be at risk for diastolic decompensation.  4. Altered sensorium: I would try to regulate thyroid function and await results of blood cultures (being of advanced age, [name] may still have an occult infection without mounting a white count). I would also consider gently hydrating [name] as BUN/creatinine ratio would suggest intravascular volume depletion. Should [name] develop more frank pulmonary edema, would be forced to give IV diuresis.   Disposition: I would consult palliative care as well.     For questions or updates, please contact [place] Please consult [email or web address] for contact info under Cardiology/STEMI.   Signed, Kate Sable, MD  07/30/2017 4:49 PM

## 2017-07-30 NOTE — H&P (Addendum)
History and Physical    Madhuri Vacca Hottenstein UEA:[id number] DOB: 1928/02/07 DOA: 07/30/2017  Referring MD/NP/PA: Tanna Furry  PCP: Chipper Herb, MD  Outpatient Specialists: Cristopher Peru- Cards  Patient coming from: SNF   Chief Complaint: Encephalopathy, Acute Resp Failure, Hypothyroidism, CHF Exacerbation, Elevated troponin.   HPI: [name] is a 83 y.o. female with medical history significant of multiple medical issues including hypothyroidism, diastolic CHF, complete heart block status post pacemaker, mitral regurgitation, aortic insufficiency presenting with multiple issues including encephalopathy, acute respiratory failure, hypothyroidism, CHF exacerbation, elevated troponin. Level V caveat in setting of encephalopathy. Limited history from family. Per report, patient was sent from local skilled nursing facility for persistent agitation. Patient has been yelling out for days per report. Patient noted to be currently a guardian of [employer] with the primary guardian being Kirstie Mirza [employer] [phone]). There have been no reported fevers, chills, episodes of vomiting or diarrhea. There have been no reported recent falls no family suspect patient may have had a fall in the recent past. Family does not report any recent medication changes. Family states that patient has been recently placed in the dementia unit, though they're unsure why. Family states the patient is usually calmer at baseline. Family also reports patient having similar agitation associated with UTI in the past. Case also discussed with the patient's guardian Heriberto Antigua who states that patient is fairly confused at baseline as of her initial assessment 1 month ago. Most recent medication changes have been increasing her dose of Zoloft as well as increasing her dose of Ativan. No reported changes in her hypothyroidism medication. ED Course: Presented to the ER afebrile hemodynamically stable. Noted to be  significantly agitated with recurrent episodes of yelling out. Status post Haldol ER with minimal improvement. White blood cell count 9.8, hemoglobin 9.1. Potassium 3.3. Urinalysis not indicative of infection. TSH 9.6, up from 8.8 one month ago and up from 2.183 months ago. Troponin 0.4. EKG pending with normalization of agitation. BNP 1095. Chest x-ray with pulmonary vascular edema with small left pleural effusion.  Review of Systems: Limited review systems in the setting of encephalopathy.  Past Medical History:  Diagnosis Date  . Aortic insufficiency   . Back pain   . Complete heart block ([place]) 08/31/2013  . Diverticulitis   . Hypertension   . Hypothyroid   . Liver lesion   . Lung nodule   . Mitral regurgitation 12/08/2014  . Rectal polyp   . Sigmoid diverticulitis 04/23/2013   History of March 2014   . 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 UTI (urinary tract infection)   . Villous adenoma of rectum 01/16/2013    Past Surgical History:  Procedure Laterality Date  . ABDOMINAL HYSTERECTOMY     still has ovaries  . FLEXIBLE SIGMOIDOSCOPY N/A 01/17/2013   Procedure: FLEXIBLE SIGMOIDOSCOPY;  Surgeon: [name], MD;  Location: [facility];  Service: Gastroenterology;  Laterality: N/A;  . INNER EAR SURGERY  1970's   due to infection  . PACEMAKER PLACEMENT  09/01/2013   [employer]  . PERMANENT PACEMAKER INSERTION N/A 09/01/2013   Procedure: PERMANENT PACEMAKER INSERTION;  Surgeon: [name], MD;  Location: [facility] C[facility] LAB;  Service: Cardiovascular;  Laterality: N/A;     reports that she has never smoked. She has never used smokeless tobacco. She reports that she does not drink alcohol or use drugs.  No Known Allergies  Family History  Problem Relation Age of Onset  . Heart  disease Mother   . Heart disease Father   . Breast cancer Sister   . Cancer Sister        breast  . Diabetes Sister   . Heart disease Sister   . Colon cancer Neg Hx     Prior to  Admission medications   Medication Sig Start Date End Date Taking? Authorizing Provider  diclofenac (VOLTAREN) 75 MG EC tablet Take 75 mg by mouth 2 (two) times daily.   Yes [provider]  levothyroxine (SYNTHROID, LEVOTHROID) 88 MCG tablet Take 88 mcg by mouth daily before breakfast.   Yes [provider]  LORazepam (ATIVAN) 0.5 MG tablet Take 0.5 mg by mouth 2 (two) times daily.   Yes [provider]  meloxicam (MOBIC) 15 MG tablet Take 15 mg by mouth every evening.   Yes [provider]  Multiple Vitamin (MULTIVITAMIN WITH MINERALS) TABS Take 1 tablet by mouth daily.   Yes [provider]  oxyCODONE (OXY IR/ROXICODONE) 5 MG immediate release tablet Take 5 mg by mouth every 6 (six) hours as needed for severe pain.   Yes [provider]  sertraline (ZOLOFT) 100 MG tablet Take 100 mg by mouth daily.   Yes [provider]  sodium chloride (OCEAN) 0.65 % SOLN nasal spray Place 1 spray into both nostrils as needed for congestion.   Yes [provider]  lisinopril (PRINIVIL,ZESTRIL) 2.5 MG tablet Take 1 tablet (2.5 mg total) by mouth daily. Patient not taking: Reported on 07/30/2017 06/13/17   [name], MD    Physical Exam: Vitals:   07/30/17 1400 07/30/17 1430 07/30/17 1500 07/30/17 1530  BP: (!) 112/48 110/64 (!) 115/50 (!) 119/53  Pulse: 71 74 74 79  Resp: 18 19 20 18   SpO2: 99% 98% 100% 100%  Weight:      Height:          Constitutional: NAD, calm, comfortable Vitals:   07/30/17 1400 07/30/17 1430 07/30/17 1500 07/30/17 1530  BP: (!) 112/48 110/64 (!) 115/50 (!) 119/53  Pulse: 71 74 74 79  Resp: 18 19 20 18   SpO2: 99% 98% 100% 100%  Weight:      Height:       Gen: in bed, agitated  Eyes: PERRL,  ENMT: Mucous membranes are markedly dry. Posterior pharynx clear of any exudate or lesions.Normal dentition.  Neck: normal, supple, no masses, no thyromegaly Respiratory: clear to auscultation bilaterally,  no wheezing, no crackles. Normal respiratory effort. No accessory muscle use.  Cardiovascular: Regular rate and rhythm,/ rubs / gallops. Faint systolic murmur. No extremity edema. 2+ pedal pulses. No carotid bruits.  Abdomen: no tenderness, no masses palpated. No hepatosplenomegaly. Bowel sounds positive.  Musculoskeletal: no clubbing / cyanosis. No joint deformity upper and lower extremities. Good ROM, no contractures. Normal muscle tone.  Skin: no rashes, lesions, ulcers. No induration Neurologic:  Confused, agitated, mildly combative. No focal deficits noted.  Psychiatric: Confused, agitated, mildly combative   Labs on Admission: I have personally reviewed following labs and imaging studies  CBC:  Recent Labs Lab 07/30/17 1306  WBC 9.8  NEUTROABS 8.0*  HGB 9.1*  HCT 27.5*  MCV 95.8  PLT 353   Basic Metabolic Panel:  Recent Labs Lab 07/30/17 1306  NA 134*  K 3.3*  CL 100*  CO2 23  GLUCOSE 107*  BUN 27*  CREATININE 0.81  CALCIUM 8.8*   GFR: Estimated Creatinine Clearance: 39.3 mL/min (by C-G formula based on SCr of 0.81 mg/dL). Liver  Function Tests:  Recent Labs Lab 07/30/17 1306  AST 23  ALT 13*  ALKPHOS 92  BILITOT 1.1  PROT 5.3*  ALBUMIN 2.7*    Recent Labs Lab 07/30/17 1306  LIPASE 28   No results for input(s): AMMONIA in the last 168 hours. Coagulation Profile: No results for input(s): INR, PROTIME in the last 168 hours. Cardiac Enzymes:  Recent Labs Lab 07/30/17 1358  TROPONINI 0.40*   BNP (last 3 results) No results for input(s): PROBNP in the last 8760 hours. HbA1C: No results for input(s): HGBA1C in the last 72 hours. CBG: No results for input(s): GLUCAP in the last 168 hours. Lipid Profile: No results for input(s): CHOL, HDL, LDLCALC, TRIG, CHOLHDL, LDLDIRECT in the last 72 hours. Thyroid Function Tests:  Recent Labs  07/30/17 1306  TSH 9.661*   Anemia Panel: No results for input(s): VITAMINB12, FOLATE, FERRITIN, TIBC,  IRON, RETICCTPCT in the last 72 hours. Urine analysis:    Component Value Date/Time   COLORURINE AMBER (A) 07/30/2017 1251   APPEARANCEUR HAZY (A) 07/30/2017 1251   APPEARANCEUR Clear 11/10/2016 1511   LABSPEC 1.023 07/30/2017 1251   PHURINE 5.0 07/30/2017 1251   GLUCOSEU NEGATIVE 07/30/2017 1251   HGBUR LARGE (A) 07/30/2017 1251   BILIRUBINUR SMALL (A) 07/30/2017 1251   BILIRUBINUR Negative 11/10/2016 1511   KETONESUR 20 (A) 07/30/2017 1251   PROTEINUR 30 (A) 07/30/2017 1251   UROBILINOGEN 0.2 01/05/2015 1615   NITRITE NEGATIVE 07/30/2017 1251   LEUKOCYTESUR NEGATIVE 07/30/2017 1251   LEUKOCYTESUR 2+ (A) 11/10/2016 1511   Sepsis Labs: @LABRCNTIP (procalcitonin:4,lacticidven:4) )No results found for this or any previous visit (from the past 240 hour(s)).   Radiological Exams on Admission: Dg Chest Port 1 View  Result Date: 07/30/2017 CLINICAL DATA:  Patient with altered mental status. Shortness of breath. EXAM: PORTABLE CHEST 1 VIEW COMPARISON:  Chest radiograph 06/25/2017. FINDINGS: Multi lead pacer apparatus overlies the left hemithorax. Monitoring leads overlie the patient. Stable cardiomegaly. Aortic atherosclerosis. Pulmonary vascular redistribution. Bilateral interstitial pulmonary opacities. Small left effusion. No pneumothorax. IMPRESSION: Pulmonary vascular redistribution and findings suggestive of edema with small left effusion. Electronically Signed   By: Lovey Newcomer M.D.   On: 07/30/2017 13:18    EKG:  Pending w/ normalization of agitation.   Assessment/Plan Active Problems:   Hypothyroidism   Encephalopathy   Acute diastolic CHF (congestive heart failure) (HCC)   Acute respiratory failure (HCC)   Elevated troponin    1- Encephalopathy -Likely multifactorial in the setting of multiple active issues including hypothyroidism, CHF,? Dementia -Likely toxic/metabolic confounders -CT of the head pending -Check ammonia level -IV Ativan for now pending EKG to assess  QT duration  2-Hypothyroidism -Noted progressively worsening TSH level over the last 3 months -Likely confounding issue to above -Patient noted to be on 88 g of Synthroid daily -We'll increase to 125-150  g given age and active cardiac issues -Check complete thyroid panel  3-acute diastolic CHF exacerbation -2D ECHO 10/2014 w/ EF 16-10%, grade 2 diastolic dysfunction -noted elevated BNP at 1095 -pulm edema on CXR w/ pleural effusion -noted to be clinically dry on exam  -? Hypothyroidism as a confounder  -noted valvular disease involving aorta and mitral valve  -pending formal cards c/s to better assess   4-Acute Resp Failure -Likely multifactorial in the setting of above -No acute hypoxia -Given bedridden state, pending CT angiogram to rule out pulmonary embolus as possible etiology of symptoms -Pending ABG as well -Supplemental O2 as needed  5-elevated troponin -Troponin  0.4 on presentation -Likely mild demand mismatch in the setting of above -Formal EKG pending improvement in agitation -Pending formal cardiology consult -Baby aspirin 1  6-Complete heart block -s/p pacemaker  -pending formal cards c/s      DVT prophylaxis: sub q heparin  Code Status: Full Code   Family Communication: Called and discussed case with legal guardian Heriberto Antigua at [phone] and made her aware of current admission. [phone] After hours line if there are any issues that need to be discussed after 5pm.  Disposition Plan: pending further evaluation. Anticipate >2MN stay  Consults called: Cardiology- [place]  Admission status: Inpt    Deneise Lever MD [facility] Pager [phone]  If 7PM-7AM, please contact night-coverage [email or web address] Password [email or web address]  07/30/2017, 4:23 PM

## 2017-07-31 ENCOUNTER — Inpatient Hospital Stay (HOSPITAL_COMMUNITY): Payer: Medicare Other

## 2017-07-31 ENCOUNTER — Encounter (HOSPITAL_COMMUNITY): Payer: Self-pay | Admitting: Family Medicine

## 2017-07-31 DIAGNOSIS — F05 Delirium due to known physiological condition: Secondary | ICD-10-CM

## 2017-07-31 DIAGNOSIS — E43 Unspecified severe protein-calorie malnutrition: Secondary | ICD-10-CM | POA: Insufficient documentation

## 2017-07-31 DIAGNOSIS — Z7189 Other specified counseling: Secondary | ICD-10-CM

## 2017-07-31 DIAGNOSIS — Z515 Encounter for palliative care: Secondary | ICD-10-CM

## 2017-07-31 DIAGNOSIS — I509 Heart failure, unspecified: Secondary | ICD-10-CM

## 2017-07-31 DIAGNOSIS — R748 Abnormal levels of other serum enzymes: Secondary | ICD-10-CM

## 2017-07-31 LAB — COMPREHENSIVE METABOLIC PANEL
ALT: 14 U/L (ref 14–54)
ANION GAP: 10 (ref 5–15)
AST: 22 U/L (ref 15–41)
Albumin: 2.5 g/dL — ABNORMAL LOW (ref 3.5–5.0)
Alkaline Phosphatase: 89 U/L (ref 38–126)
BILIRUBIN TOTAL: 1 mg/dL (ref 0.3–1.2)
BUN: 23 mg/dL — ABNORMAL HIGH (ref 6–20)
CO2: 20 mmol/L — ABNORMAL LOW (ref 22–32)
Calcium: 8.4 mg/dL — ABNORMAL LOW (ref 8.9–10.3)
Chloride: 104 mmol/L (ref 101–111)
Creatinine, Ser: 0.69 mg/dL (ref 0.44–1.00)
GFR calc Af Amer: >60 mL/min (ref >60)
Glucose, Bld: 84 mg/dL (ref 65–99)
POTASSIUM: 2.9 mmol/L — AB (ref 3.5–5.1)
Sodium: 134 mmol/L — ABNORMAL LOW (ref 135–145)
TOTAL PROTEIN: 5.1 g/dL — AB (ref 6.5–8.1)

## 2017-07-31 LAB — CBC
HEMATOCRIT: 27.9 % — AB (ref 36.0–46.0)
Hemoglobin: 8.8 g/dL — ABNORMAL LOW (ref 12.0–15.0)
MCH: 31.1 pg (ref 26.0–34.0)
MCHC: 31.5 g/dL (ref 30.0–36.0)
MCV: 98.6 fL (ref 78.0–100.0)
Platelets: 216 10*3/uL (ref 150–400)
RBC: 2.83 MIL/uL — ABNORMAL LOW (ref 3.87–5.11)
RDW: 19.3 % — AB (ref 11.5–15.5)
WBC: 8.5 10*3/uL (ref 4.0–10.5)

## 2017-07-31 LAB — BASIC METABOLIC PANEL
Anion gap: 9 (ref 5–15)
BUN: 21 mg/dL — AB (ref 6–20)
CALCIUM: 8.6 mg/dL — AB (ref 8.9–10.3)
CO2: 21 mmol/L — AB (ref 22–32)
Chloride: 104 mmol/L (ref 101–111)
Creatinine, Ser: 0.72 mg/dL (ref 0.44–1.00)
GFR calc Af Amer: >60 mL/min (ref >60)
GLUCOSE: 109 mg/dL — AB (ref 65–99)
Potassium: 3.7 mmol/L (ref 3.5–5.1)
Sodium: 134 mmol/L — ABNORMAL LOW (ref 135–145)

## 2017-07-31 LAB — TROPONIN I
TROPONIN I: 0.28 ng/mL — AB (ref <0.03)
TROPONIN I: 0.31 ng/mL — AB (ref <0.03)

## 2017-07-31 LAB — T3, FREE: T3, Free: 1 pg/mL — ABNORMAL LOW (ref 2.0–4.4)

## 2017-07-31 LAB — MRSA PCR SCREENING: MRSA by PCR: NEGATIVE

## 2017-07-31 LAB — MAGNESIUM: Magnesium: 2 mg/dL (ref 1.7–2.4)

## 2017-07-31 MED ORDER — LIP MEDEX EX OINT: 1.0000 "application " | TOPICAL_OINTMENT | CUTANEOUS | Status: DC | PRN

## 2017-07-31 MED ORDER — POTASSIUM CHLORIDE IN NACL 20-0.9 MEQ/L-% IV SOLN
INTRAVENOUS | Status: AC
  Administered 2017-07-31: 10:00:00 via INTRAVENOUS

## 2017-07-31 MED ORDER — ADULT MULTIVITAMIN W/MINERALS CH
1.0000 | ORAL_TABLET | Freq: Every day | ORAL | Status: DC
  Administered 2017-07-31: 1 via ORAL
  Filled 2017-07-31 (×3): qty 1

## 2017-07-31 MED ORDER — SERTRALINE HCL 50 MG PO TABS
100.0000 mg | ORAL_TABLET | Freq: Every day | ORAL | Status: DC
  Administered 2017-07-31: 100 mg via ORAL
  Filled 2017-07-31 (×3): qty 2

## 2017-07-31 MED ORDER — ENSURE ENLIVE PO LIQD
237.0000 mL | Freq: Two times a day (BID) | ORAL | Status: DC
  Administered 2017-07-31 (×2): 237 mL via ORAL

## 2017-07-31 MED ORDER — LEVOTHYROXINE SODIUM 88 MCG PO TABS
88.0000 ug | ORAL_TABLET | Freq: Every day | ORAL | Status: DC
  Filled 2017-07-31: qty 1

## 2017-07-31 MED ORDER — OXYCODONE HCL 5 MG PO TABS
5.0000 mg | ORAL_TABLET | Freq: Four times a day (QID) | ORAL | Status: DC | PRN
  Administered 2017-07-31: 5 mg via ORAL
  Filled 2017-07-31: qty 1

## 2017-07-31 MED ORDER — POTASSIUM CHLORIDE 20 MEQ/15ML (10%) PO SOLN
20.0000 meq | Freq: Three times a day (TID) | ORAL | Status: AC
  Administered 2017-07-31 (×2): 20 meq via ORAL
  Filled 2017-07-31 (×2): qty 30

## 2017-07-31 MED ORDER — BLISTEX MEDICATED EX OINT
TOPICAL_OINTMENT | CUTANEOUS | Status: DC | PRN
  Administered 2017-07-31: 1 via TOPICAL
  Filled 2017-07-31: qty 6.3

## 2017-07-31 NOTE — Progress Notes (Addendum)
PROGRESS NOTE  [name] [facility]  ION:[id number]  DOB: 1927/11/05  DOA: 07/30/2017 PCP: Chipper Herb, MD  Brief Admission Hx: 82 y/o female who presented from the emergency department with encephalopathy and severe agitation.   MDM/Assessment & Plan:   1. Acute encephalopathy - some mild improvement but continues to be confused, will request a neurology consult.  Continue neuro checks.   She has had progressive decline in recent months and I've requested a palliative consult to assist with goals of care.  2. Small SAH - hold aspirin, hold heparin.  Repeat CT head today for surveillance.    3. Acute exacerbation of chronic diastolic CHF - appreciate assistance of cardiology team, she has some pulmonary edema but was clinically dry on exam and cards opted to treat with gentle fluid hydration, if worsening pulm edema would need to diurese with lasix.  Her heart failure is a manifestation of her poorly functioning mitral valve which cannot be repaired.   4. Acute respiratory failure - CTA ruled out PE but there was a medium sized left pleural effusion.  I am concerned about attempting thoracentesis in the patient's current state of confusion and agitation but could consider this if that improves some.   5. Hypokalemia - replace in IV and orally.  Follow.  Check magnesium.  6. Hypothyroidism - mildly elevated TSH, but normal Free T4, continue levothyroxine daily.   DVT prophylaxis: SCDs Code Status: FULL  Family Communication: guardian called Disposition Plan: TBD  Consultants:  Palliative medicine  Cardiology   Subjective: Pt says that she hurts all over  Objective: Vitals:   07/30/17 1745 07/30/17 1835 07/30/17 2009 07/31/17 0504  BP: (!) 128/59 (!) 129/57 (!) 123/59 (!) 120/51  Pulse: 86 83 78 88  Resp: 18 20 20 18   Temp:  (!) 97.5 F (36.4 C) 97.7 F (36.5 C) 98.4 F (36.9 C)  TempSrc:  Axillary Axillary Axillary  SpO2: 97% 100% 100% 98%  Weight:  49 kg (108 lb)      Height:  5\' 6"  (1.676 m)      Intake/Output Summary (Last 24 hours) at 07/31/17 1249 Last data filed at 07/31/17 0540  Gross per 24 hour  Intake           563.33 ml  Output                0 ml  Net           563.33 ml   Filed Weights   07/30/17 1247 07/30/17 1835  Weight: 49.9 kg (110 lb) 49 kg (108 lb)   REVIEW OF SYSTEMS  As per history otherwise all reviewed and reported negative  Exam:  General exam: thin, agitated female, NAD.  Respiratory system: Clear. No increased work of breathing. Cardiovascular system: S1 & S2 heard.   Gastrointestinal system: Abdomen is nondistended, soft and nontender. Normal bowel sounds heard. Central nervous system: Alert and confused at times.  No focal neurological deficits. Extremities: no CCE.  Data Reviewed: Basic Metabolic Panel:  Recent Labs Lab 07/30/17 1306 07/31/17 0418  NA 134* 134*  K 3.3* 2.9*  CL 100* 104  CO2 23 20*  GLUCOSE 107* 84  BUN 27* 23*  CREATININE 0.81 0.69  CALCIUM 8.8* 8.4*  MG  --  2.0   Liver Function Tests:  Recent Labs Lab 07/30/17 1306 07/31/17 0418  AST 23 22  ALT 13* 14  ALKPHOS 92 89  BILITOT 1.1 1.0  PROT 5.3* 5.1*  ALBUMIN  2.7* 2.5*    Recent Labs Lab 07/30/17 1306  LIPASE 28    Recent Labs Lab 07/30/17 1618  AMMONIA 23   CBC:  Recent Labs Lab 07/30/17 1306 07/31/17 0418  WBC 9.8 8.5  NEUTROABS 8.0*  --   HGB 9.1* 8.8*  HCT 27.5* 27.9*  MCV 95.8 98.6  PLT 220 216   Cardiac Enzymes:  Recent Labs Lab 07/30/17 1358 07/30/17 2220 07/31/17 0418 07/31/17 1026  TROPONINI 0.40* 0.32* 0.28* 0.31*   CBG (last 3)  No results for input(s): GLUCAP in the last 72 hours. Recent Results (from the past 240 hour(s))  MRSA PCR Screening     Status: None   Collection Time: 07/30/17  7:02 PM  Result Value Ref Range Status   MRSA by PCR NEGATIVE NEGATIVE Final    Comment:        The GeneXpert MRSA Assay (FDA approved for NASAL specimens only), is one component of  a comprehensive MRSA colonization surveillance program. It is not intended to diagnose MRSA infection nor to guide or monitor treatment for MRSA infections.      Studies: Ct Head Wo Contrast  Result Date: 07/30/2017 CLINICAL DATA:  Altered mental status EXAM: CT HEAD WITHOUT CONTRAST TECHNIQUE: Contiguous axial images were obtained from the base of the skull through the vertex without intravenous contrast. COMPARISON:  Head CT 06/25/2017 FINDINGS: Brain: There is a small focus of subarachnoid hemorrhage along the right frontal convexity measuring approximately 6 mm. Second focus of suspected subarachnoid hemorrhages seen at the superior left convexity. Old right basal ganglia lacunar infarct. No midline shift or other mass effect. There is periventricular hypoattenuation compatible with chronic microvascular disease. Vascular: No hyperdense vessel or unexpected calcification. Skull: Normal visualized skull base, calvarium and extracranial soft tissues. Sinuses/Orbits: Paranasal sinuses are clear. Partial opacification of the right middle ear. Normal orbits. IMPRESSION: 1. Small foci of subarachnoid hemorrhage along both superior frontal convexities. In a patient of this age, this may be secondary to underlying cerebral amyloid angiopathy. 2. No mass effect or hydrocephalus. 3. 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 Critical Value/emergent results were called by telephone at the time of interpretation on 07/30/2017 at 5:59 pm to Dr. Jeneen Rinks , who verbally acknowledged these results. Electronically Signed   By: Ulyses Jarred M.D.   On: 07/30/2017 17:58   Ct Angio Chest Pe W Or Wo Contrast  Result Date: 07/30/2017 CLINICAL DATA:  Dyspnea.  Altered mental status. EXAM: CT ANGIOGRAPHY CHEST WITH CONTRAST TECHNIQUE: Multidetector CT imaging of the chest was performed using the standard protocol during bolus administration of intravenous contrast. Multiplanar CT image reconstructions and  MIPs were obtained to evaluate the vascular anatomy. CONTRAST:  100 mL Isovue 370 COMPARISON:  Chest radiograph 07/30/2017 FINDINGS: Cardiovascular: Contrast injection is sufficient to demonstrate satisfactory opacification of the pulmonary arteries to the segmental level. There is no pulmonary embolus. The main pulmonary artery is within normal limits for size. There is no CT evidence of acute right heart strain. There is calcific aortic atherosclerosis. Heart size is enlarged. No pericardial effusion. Mediastinum/Nodes: No mediastinal, hilar or axillary lymphadenopathy. The visualized thyroid and thoracic esophageal course are unremarkable. Lungs/Pleura: Medium left pleural effusion. Left basilar atelectasis. Dependent subsegmental atelectasis in the right lung. No large consolidation. Upper Abdomen: Contrast bolus timing is not optimized for evaluation of the abdominal organs. Within this limitation, the visualized organs of the upper abdomen are normal. Musculoskeletal: No chest wall abnormality. No acute or significant osseous findings. Review of  the MIP images confirms the above findings. IMPRESSION: 1. No pulmonary embolus. 2. Medium-sized left pleural effusion with associated atelectasis. 3. Cardiomegaly and Aortic Atherosclerosis (ICD10-I70.0). Electronically Signed   By: Ulyses Jarred M.D.   On: 07/30/2017 17:47   Dg Chest Port 1 View  Result Date: 07/30/2017 CLINICAL DATA:  Patient with altered mental status. Shortness of breath. EXAM: PORTABLE CHEST 1 VIEW COMPARISON:  Chest radiograph 06/25/2017. FINDINGS: Multi lead pacer apparatus overlies the left hemithorax. Monitoring leads overlie the patient. Stable cardiomegaly. Aortic atherosclerosis. Pulmonary vascular redistribution. Bilateral interstitial pulmonary opacities. Small left effusion. No pneumothorax. IMPRESSION: Pulmonary vascular redistribution and findings suggestive of edema with small left effusion. Electronically Signed   By: Lovey Newcomer M.D.   On: 07/30/2017 13:18   Scheduled Meds: . chlorhexidine  15 mL Mouth Rinse BID  . feeding supplement (ENSURE ENLIVE)  237 mL Oral BID BM  . [START ON 08/01/2017] levothyroxine  88 mcg Oral QAC breakfast  . mouth rinse  15 mL Mouth Rinse q12n4p  . multivitamin with minerals  1 tablet Oral Daily  . potassium chloride  20 mEq Oral TID  . sertraline  100 mg Oral Daily   Continuous Infusions: . 0.9 % NaCl with KCl 20 mEq / L 50 mL/hr at 07/31/17 6811    Active Problems:   Hypothyroidism   Encephalopathy   Acute diastolic CHF (congestive heart failure) (HCC)   Acute respiratory failure (HCC)   Elevated troponin  Time spent:   Irwin Brakeman, MD, FAAFP [facility] Pager [phone]  If 7PM-7AM, please contact night-coverage [email or web address] Password [email or web address] 07/31/2017, 12:49 PM    LOS: 1 day

## 2017-07-31 NOTE — Consult Note (Signed)
Consultation Note Date: 07/31/2017   Patient Name: [name] ZOXWRUEAV  DOB: 1928-05-09  MRN: [id number]  Age / Sex: 83 y.o., female  PCP: Chipper Herb, MD Referring Physician: Murlean Iba, MD  Reason for Consultation: Establishing goals of care and Psychosocial/spiritual support  HPI/Patient Profile: 83 y.o. female  with past medical history of diastolic heart failure, complete heart block with pacemaker, mitral regurgitation, aortic insufficiency, hypothyroidism, history of long nodule and liver lesion, diverticulitis, history of UTI admitted on 07/30/2017 with encephalopathy that is likely multifactorial.   Clinical Assessment and Goals of Care: [name] is resting  In bed with tech/sitter at bedside. She is known to cry out occasionally. She seems relatively calm as I enter, she greets me, making eye contact. She is able to tell me her name,but tells me she does not know where we are. I reassure her that we are in the hospital, and that she is safe. She tells me that she wants to go home. She shares that her home is in [place]. I encouraged her to stay with [place] through lunch, in order to keep her calm.  She declines food or drink.   Call to legal guardian, Heriberto Antigua with "[employer]". Detailed voicemail message left, requesting return call.    Conference with nursing staff related to patient care and family. Nursing staff states that nieces, Bethena Roys and Tye [place], who live locally, are concerned about Mrs. Camino's condition. They seem appropriate per nursing.  Call to legal guardian Heriberto Antigua with "[employer]" at work number voicemail message left.  Text to mobile number. No return call after several hours. I will attempt to reach legal guardian again tomorrow.  Healthcare power of attorney  LEGAL GUARDIAN - "[employer]", Heriberto Antigua [phone].    SUMMARY OF  RECOMMENDATIONS   At this point, full treatments code. Continue code status discussions.  Code Status/Advance Care Planning:  Full code - at this point, full scope treatment.Have been unable to have code status discussion with Heriberto Antigua with "[employer]". Per nursing staff, family (nieces) are endorsing allow a natural death (DNR).  Symptom Management:   Per hospitalist, no additional needs at this time.  Palliative Prophylaxis:   Frequent Pain Assessment  Additional Recommendations (Limitations, Scope, Preferences):  Full Scope Treatment  Psycho-social/Spiritual:   Desire for further Chaplaincy support:no  Additional Recommendations: Caregiving  Support/Resources and Education on Hospice  Prognosis:   < 6 months, or less would not be surprising based on frailty and functional decline over the last 3 to 4 months,extensive coronary history downward trend in albumin to 2.5, what seems to be a relativly quick dementia trajectory.  Discharge Planning: likely return to [facility] as a resident      Primary Diagnoses: Present on Admission: . Encephalopathy . Hypothyroidism   I have reviewed the medical record, interviewed the patient and family, and examined the patient. The following aspects are pertinent.  Past Medical History:  Diagnosis Date  . Aortic insufficiency   . Back pain   .  Complete heart block ([place]) 08/31/2013  . Diverticulitis   . Hypertension   . Hypothyroid   . Liver lesion   . Lung nodule   . Mitral regurgitation 12/08/2014  . Rectal polyp   . Sigmoid diverticulitis 04/23/2013   History of March 2014   . 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 UTI (urinary tract infection)   . Villous adenoma of rectum 01/16/2013   Social History   Social History  . Marital status: Widowed    Spouse name: N/A  . Number of children: 1  . Years of education: N/A   Occupational History  . retired    Social History Main  Topics  . Smoking status: Never Smoker  . Smokeless tobacco: Never Used  . Alcohol use No  . Drug use: No  . Sexual activity: No   Other Topics Concern  . None   Social History Narrative  . None   Family History  Problem Relation Age of Onset  . Heart disease Mother   . Heart disease Father   . Breast cancer Sister   . Cancer Sister        breast  . Diabetes Sister   . Heart disease Sister   . Colon cancer Neg Hx    Scheduled Meds: . aspirin EC  81 mg Oral Daily  . chlorhexidine  15 mL Mouth Rinse BID  . feeding supplement (ENSURE ENLIVE)  237 mL Oral BID BM  . heparin  5,000 Units Subcutaneous Q8H  . [START ON 08/01/2017] levothyroxine  88 mcg Oral QAC breakfast  . mouth rinse  15 mL Mouth Rinse q12n4p  . multivitamin with minerals  1 tablet Oral Daily  . potassium chloride  20 mEq Oral TID  . sertraline  100 mg Oral Daily   Continuous Infusions: . 0.9 % NaCl with KCl 20 mEq / L 50 mL/hr at 07/31/17 0949   PRN Meds:.LORazepam, oxyCODONE Medications Prior to Admission:  Prior to Admission medications   Medication Sig Start Date End Date Taking? Authorizing Provider  diclofenac (VOLTAREN) 75 MG EC tablet Take 75 mg by mouth 2 (two) times daily.   Yes [provider]  levothyroxine (SYNTHROID, LEVOTHROID) 88 MCG tablet Take 88 mcg by mouth daily before breakfast.   Yes [provider]  LORazepam (ATIVAN) 0.5 MG tablet Take 0.5 mg by mouth 2 (two) times daily.   Yes [provider]  meloxicam (MOBIC) 15 MG tablet Take 15 mg by mouth every evening.   Yes [provider]  Multiple Vitamin (MULTIVITAMIN WITH MINERALS) TABS Take 1 tablet by mouth daily.   Yes [provider]  oxyCODONE (OXY IR/ROXICODONE) 5 MG immediate release tablet Take 5 mg by mouth every 6 (six) hours as needed for severe pain.   Yes [provider]  sertraline (ZOLOFT) 100 MG tablet Take 100 mg by mouth daily.   Yes [provider]  sodium  chloride (OCEAN) 0.65 % SOLN nasal spray Place 1 spray into both nostrils as needed for congestion.   Yes [provider]   No Known Allergies Review of Systems  Unable to perform ROS: Acuity of condition    Physical Exam  Constitutional: No distress.  Cardiovascular: Normal rate and regular rhythm.   Pulmonary/Chest: Effort normal. No respiratory distress.  Abdominal: Soft. She exhibits no distension.  Neurological: She is alert.  Skin: Skin is warm and dry.  Nursing note and vitals reviewed.   Vital Signs: BP (!) 120/51 (  BP Location: Right Arm)   Pulse 88   Temp 98.4 F (36.9 C) (Axillary)   Resp 18   Ht 5\' 6"  (1.676 m)   Wt 49 kg (108 lb)   SpO2 98%   BMI 17.43 kg/m  Pain Assessment: No/denies pain       SpO2: SpO2: 98 % O2 Device:SpO2: 98 % O2 Flow Rate: .   IO: Intake/output summary:  Intake/Output Summary (Last 24 hours) at 07/31/17 1120 Last data filed at 07/31/17 0540  Gross per 24 hour  Intake           563.33 ml  Output                0 ml  Net           563.33 ml    LBM: Last BM Date:  (pt unable to tell when last BM was) Baseline Weight: Weight: 49.9 kg (110 lb) Most recent weight: Weight: 49 kg (108 lb)     Palliative Assessment/Data:   Flowsheet Rows     Most Recent Value  Intake Tab  Referral Department  Hospitalist  Unit at Time of Referral  Med/Surg Unit  Palliative Care Primary Diagnosis  Other (Comment)  Date Notified  07/31/17  Palliative Care Type  New Palliative care  Reason for referral  Clarify Goals of Care  Date of Admission  07/30/17  Date first seen by Palliative Care  07/31/17  # of days Palliative referral response time  0 Day(s)  # of days IP prior to Palliative referral  1  Clinical Assessment  Palliative Performance Scale Score  30%  Pain Max last 24 hours  Not able to report  Pain Min Last 24 hours  Not able to report  Dyspnea Max Last 24 Hours  Not able to report  Dyspnea Min Last 24 hours  Not able to  report  Psychosocial & Spiritual Assessment  Palliative Care Outcomes  Patient/Family meeting held?  Yes  Who was at the meeting?  legal guardian, Heriberto Antigua, via phone  Palliative Care Outcomes  Provided advance care planning, Provided psychosocial or spiritual support, Clarified goals of care      Time In: 0955 Time Out: 1105 Time Total: 70 minutes Greater than 50%  of this time was spent counseling and coordinating care related to the above assessment and plan.  Signed by: Drue Novel, NP   Please contact Palliative Medicine Team phone at [phone] for questions and concerns.  For individual provider: See Shea Evans

## 2017-07-31 NOTE — Progress Notes (Signed)
Initial Nutrition Assessment  DOCUMENTATION CODES:  Severe malnutrition in context of acute illness/injury, Underweight  INTERVENTION:  Ensure Enlive po BID, each supplement provides 350 kcal and 20 grams of protein  Magic cup BID with meals, each supplement provides 290 kcal and 9 grams of protein  MVI with minerals  Will inquire regarding diet advancement  NUTRITION DIAGNOSIS:  Malnutrition (Severe, acute context) related to lethargy/confusion, poor appetite, social / environmental circumstances as evidenced by moderate fat wasting, severe muscle wasting, wt loss of 10% bw in approximately 1 month    GOAL:  Patient will meet greater than or equal to 90% of their needs  MONITOR:  PO intake, Supplement acceptance, Labs, Diet advancement  REASON FOR ASSESSMENT:  Malnutrition Screening Tool    ASSESSMENT:  83 y/o female PMHx CHF, Heart block s/p pacemaker, mitral regurgitation, aortic insufficiency, HTN. Resides in dementia unit in local SNF. Guardian of State. Presents with encephalopathy. Has been yelling out for days. Admitted for workup of AMS as well as acute CHF exacerbation, Acute resp failure, worsening hypothyroidism. Palliative care consulted due to severe mitral regurgitation causing HF and being a poor surgical canidiate for repair.  Patient remains very confused. She asks continually to be put back and bed or to go home. Almost all history obtained from niece at bedside.   She reports the patient was driving 3 months ago. She was hospitalized for a UTI that ultimately led to a large decline. She sounds to have gone to a SNF where she continued to decline cognitively. The niece states this was from a combination of polypharmacy and poor care. She was placed in a dementia unit where she "grieved herself to death" due to depression over her circumstances. Apparently, for at least a week, they have been unable to get the patient to eat anything, she would just scream. She would  not recognize the nieces. Ultimately, she says she took a video of the pts behaviors and sent it to the guardian who then called 911 for patient to come to hospital.   Niece notes no history of n/v/c/d. She believes her poor intake was solely related to her encephalopathy precipitated by her depression.   Niece believes the patient has lost 20 lbs since she was at the [place] center, a length of approximately 2.5 months. Per chart review, the patient has lost 19 lbs in 7 months and 10 lbs since seen in ED ~35 days ago. These are clinically significant losses of 15% and 8.5% bw, respectively.   Physical exam reveals Mild BLE edema (niece says this is markedly improved) Severe temporal muscle wasting. Moderate wasting of deltoids and clavicular musculature. Moderate wasting of thoracic, orbital and underarm fat stores.   At this time, patient is eating poorly. She had eaten 10-25% of her breakfast tray. Noted she is on a Full liquid diet? Will inquire about advancement. Will order Ensure, Magic Cup, MVI.   Labs: K:2.9, H/H:l8.8/27.9, Albumin: 2.5, BNP >1000,  Meds: Ensure, MVI, KCL, IVF   Recent Labs Lab 07/30/17 1306 07/31/17 0418  NA 134* 134*  K 3.3* 2.9*  CL 100* 104  CO2 23 20*  BUN 27* 23*  CREATININE 0.81 0.69  CALCIUM 8.8* 8.4*  MG  --  2.0  GLUCOSE 107* 84   Diet Order:  Diet Heart Room service appropriate? Yes; Fluid consistency: Thin  Skin:  Scattered bruising to arms/legs  Last BM:  Unknown  Height:  Ht Readings from Last 1 Encounters:  07/30/17 5\' 6"  (  1.676 m)   Weight:  Wt Readings from Last 1 Encounters:  07/30/17 108 lb (49 kg)   Wt Readings from Last 10 Encounters:  07/30/17 108 lb (49 kg)  06/25/17 118 lb (53.5 kg)  04/13/17 115 lb (52.2 kg)  12/18/16 127 lb 6.4 oz (57.8 kg)  11/09/16 128 lb (58.1 kg)  08/16/16 132 lb 6.4 oz (60.1 kg)  08/05/16 133 lb (60.3 kg)  06/21/16 133 lb (60.3 kg)  06/14/16 134 lb (60.8 kg)  03/20/16 135 lb (61.2 kg)   Ideal  Body Weight:  59.1 kg  BMI:  Body mass index is 17.43 kg/m.  Estimated Nutritional Needs:  Kcal:  1600-1800 (33-37 kcal/kg bw) Protein:  69-78 g (1.4-1.6 g/kg bw) Fluid:  >1.25 L (25 ml/kg bw)  EDUCATION NEEDS:  No education needs identified at this time  Burtis Junes RD, LDN, [place] [facility] Pager: [phone] 07/31/2017 2:04 PM

## 2017-07-31 NOTE — Consult Note (Signed)
[name], MD     [email or web address]          [name] is an 83 y.o. female.   ASSESSMENT/PLAN: 1. Acute metabolic encephalopathy due to acute medical illness including dyspnea, congestive heart failure and dehydration.   2. Two tiny frontal subarachnoid hemorrhage likely traumatic in nature.  It is unlikely that these are due to vascular abnormalities and therefore additional imaging is not warranted.  3. Likely moderate baseline dementia of the Alzheimer's type.   Additional dementia labs were ordered.  I will start the patient on Aricept 5 mg. Consider increasing to 10 mg after 1 month.       The patient is an 83 year old white female who presents to the hospital with the dyspnea and the altered mental status.  She has been diagnosed with congestive heart failure and valvular heart disease.  She has been noted by family to have progressively worsening cognitive impairment over the last year.    The patient had imaging which showed concerning findings and hence in neurological consultation.  We are unable to obtain review of systems given the marked cognitive impairment.  GENERAL: This is a thin somewhat  under nourished female who is unresponsive but in no acute distress.  HEENT:  Neck is supple and head is atraumatic  ABDOMEN: Soft  EXTREMITIES: No edema   BACK: Normal alignment.  SKIN: Normal by inspection.    MENTAL STATUS:  She lays in bed with eyes closed.  There is no eye opening even to deep painful stimuli.  She does not follow commands.  She moans and groans to painful stimuli.  CRANIAL NERVES: Pupils are equal, round and reactive to light; extraocular movements are full, there is no significant nystagmus; upper and lower facial muscles are normal in strength and symmetric, there is no flattening of the nasolabial folds   MOTOR:   She moved both size with decent power at least 3 if not 4 or to deep painful stimuli.  Bulk and  tone are normal throughout.  COORDINATION:   No tremors are observed.  There are no cogwheeling or rigidity.  No dysmetria appreciated.  REFLEXES: Deep tendon reflexes are symmetrical and normal.   SENSATION:   She responds to painful stimuli in each site.    Blood pressure (!) 129/54, pulse 91, temperature 98 F (36.7 C), temperature source Oral, resp. rate 18, height 5' 6"  (1.676 m), weight 108 lb (49 kg), SpO2 100 %.  Past Medical History:  Diagnosis Date  . Aortic insufficiency   . Back pain   . Complete heart block ([place]) 08/31/2013  . Diverticulitis   . Hypertension   . Hypothyroid   . Liver lesion   . Lung nodule   . Mitral regurgitation 12/08/2014  . Rectal polyp   . Sigmoid diverticulitis 04/23/2013   History of March 2014   . 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 UTI (urinary tract infection)   . Villous adenoma of rectum 01/16/2013    Past Surgical History:  Procedure Laterality Date  . ABDOMINAL HYSTERECTOMY     still has ovaries  . FLEXIBLE SIGMOIDOSCOPY N/A 01/17/2013   Procedure: FLEXIBLE SIGMOIDOSCOPY;  Surgeon: [name], MD;  Location: [facility];  Service: Gastroenterology;  Laterality: N/A;  . INNER EAR SURGERY  1970's   due to infection  . PACEMAKER PLACEMENT  09/01/2013   [employer]  . PERMANENT PACEMAKER INSERTION N/A 09/01/2013   Procedure: PERMANENT PACEMAKER INSERTION;  Surgeon: [name], MD;  Location: [facility] CATH LAB;  Service: Cardiovascular;  Laterality: N/A;    Family History  Problem Relation Age of Onset  . Heart disease Mother   . Heart disease Father   . Breast cancer Sister   . Cancer Sister        breast  . Diabetes Sister   . Heart disease Sister   . Colon cancer Neg Hx     Social History:  reports that she has never smoked. She has never used smokeless tobacco. She reports that she does not drink alcohol or use drugs.  Allergies: No Known Allergies  Medications: Prior to Admission medications     Medication Sig Start Date End Date Taking? Authorizing Provider  diclofenac (VOLTAREN) 75 MG EC tablet Take 75 mg by mouth 2 (two) times daily.   Yes [provider]  levothyroxine (SYNTHROID, LEVOTHROID) 88 MCG tablet Take 88 mcg by mouth daily before breakfast.   Yes [provider]  LORazepam (ATIVAN) 0.5 MG tablet Take 0.5 mg by mouth 2 (two) times daily.   Yes [provider]  meloxicam (MOBIC) 15 MG tablet Take 15 mg by mouth every evening.   Yes [provider]  Multiple Vitamin (MULTIVITAMIN WITH MINERALS) TABS Take 1 tablet by mouth daily.   Yes [provider]  oxyCODONE (OXY IR/ROXICODONE) 5 MG immediate release tablet Take 5 mg by mouth every 6 (six) hours as needed for severe pain.   Yes [provider]  sertraline (ZOLOFT) 100 MG tablet Take 100 mg by mouth daily.   Yes [provider]  sodium chloride (OCEAN) 0.65 % SOLN nasal spray Place 1 spray into both nostrils as needed for congestion.   Yes [provider]    Scheduled Meds: . feeding supplement (ENSURE ENLIVE)  237 mL Oral BID BM  . [START ON 08/01/2017] levothyroxine  88 mcg Oral QAC breakfast  . multivitamin with minerals  1 tablet Oral Daily  . potassium chloride  20 mEq Oral TID  . sertraline  100 mg Oral Daily   Continuous Infusions: . 0.9 % NaCl with KCl 20 mEq / L 50 mL/hr at 07/31/17 0949   PRN Meds:.lip balm, LORazepam, oxyCODONE     Results for orders placed or performed during the hospital encounter of 07/30/17 (from the past 48 hour(s))  Urinalysis, Routine w reflex microscopic     Status: Abnormal   Collection Time: 07/30/17 12:51 PM  Result Value Ref Range   Color, Urine AMBER (A) YELLOW    Comment: BIOCHEMICALS MAY BE AFFECTED BY COLOR   APPearance HAZY (A) CLEAR   Specific Gravity, Urine 1.023 1.005 - 1.030   pH 5.0 5.0 - 8.0   Glucose, UA NEGATIVE NEGATIVE mg/dL   Hgb urine dipstick LARGE (A) NEGATIVE   Bilirubin Urine  SMALL (A) NEGATIVE   Ketones, ur 20 (A) NEGATIVE mg/dL   Protein, ur 30 (A) NEGATIVE mg/dL   Nitrite NEGATIVE NEGATIVE   Leukocytes, UA NEGATIVE NEGATIVE   RBC / HPF 6-30 0 - 5 RBC/hpf   WBC, UA 0-5 0 - 5 WBC/hpf   Bacteria, UA RARE (A) NONE SEEN   Squamous Epithelial / LPF 0-5 (A) NONE SEEN   Mucus PRESENT   CBC with Differential/Platelet     Status: Abnormal   Collection Time: 07/30/17  1:06 PM  Result Value Ref Range   WBC 9.8 4.0 - 10.5 K/uL   RBC 2.87 (L) 3.87 - 5.11 MIL/uL  Hemoglobin 9.1 (L) 12.0 - 15.0 g/dL   HCT 27.5 (L) 36.0 - 46.0 %   MCV 95.8 78.0 - 100.0 fL   MCH 31.7 26.0 - 34.0 pg   MCHC 33.1 30.0 - 36.0 g/dL   RDW 20.0 (H) 11.5 - 15.5 %   Platelets 220 150 - 400 K/uL   Neutrophils Relative % 81 %   Neutro Abs 8.0 (H) 1.7 - 7.7 K/uL   Lymphocytes Relative 9 %   Lymphs Abs 0.9 0.7 - 4.0 K/uL   Monocytes Relative 10 %   Monocytes Absolute 1.0 0.1 - 1.0 K/uL   Eosinophils Relative 0 %   Eosinophils Absolute 0.0 0.0 - 0.7 K/uL   Basophils Relative 0 %   Basophils Absolute 0.0 0.0 - 0.1 K/uL  Comprehensive metabolic panel     Status: Abnormal   Collection Time: 07/30/17  1:06 PM  Result Value Ref Range   Sodium 134 (L) 135 - 145 mmol/L   Potassium 3.3 (L) 3.5 - 5.1 mmol/L   Chloride 100 (L) 101 - 111 mmol/L   CO2 23 22 - 32 mmol/L   Glucose, Bld 107 (H) 65 - 99 mg/dL   BUN 27 (H) 6 - 20 mg/dL   Creatinine, Ser 0.81 0.44 - 1.00 mg/dL   Calcium 8.8 (L) 8.9 - 10.3 mg/dL   Total Protein 5.3 (L) 6.5 - 8.1 g/dL   Albumin 2.7 (L) 3.5 - 5.0 g/dL   AST 23 15 - 41 U/L   ALT 13 (L) 14 - 54 U/L   Alkaline Phosphatase 92 38 - 126 U/L   Total Bilirubin 1.1 0.3 - 1.2 mg/dL   GFR calc non Af Amer >60 >60 mL/min   GFR calc Af Amer >60 >60 mL/min    Comment: (NOTE) The eGFR has been calculated using the CKD EPI equation. This calculation has not been validated in all clinical situations. eGFR's persistently <60 mL/min signify possible Chronic Kidney Disease.    Anion  gap 11 5 - 15  TSH     Status: Abnormal   Collection Time: 07/30/17  1:06 PM  Result Value Ref Range   TSH 9.661 (H) 0.350 - 4.500 uIU/mL    Comment: Performed by a 3rd Generation assay with a functional sensitivity of <=0.01 uIU/mL.  Lipase, blood     Status: None   Collection Time: 07/30/17  1:06 PM  Result Value Ref Range   Lipase 28 11 - 51 U/L  Troponin I     Status: Abnormal   Collection Time: 07/30/17  1:58 PM  Result Value Ref Range   Troponin I 0.40 (HH) <0.03 ng/mL    Comment: CRITICAL RESULT CALLED TO, READ BACK BY AND VERIFIED WITH: MINTER,R @ 1515 ON 10.1.18 BY BOWMAN,L   Brain natriuretic peptide     Status: Abnormal   Collection Time: 07/30/17  1:58 PM  Result Value Ref Range   B Natriuretic Peptide 1,095.0 (H) 0.0 - 100.0 pg/mL  D-dimer, quantitative (not at [facility])     Status: Abnormal   Collection Time: 07/30/17  1:58 PM  Result Value Ref Range   D-Dimer, Quant 1.85 (H) 0.00 - 0.50 ug/mL-FEU    Comment: (NOTE) At the manufacturer cut-off of 0.50 ug/mL FEU, this assay has been documented to exclude PE with a sensitivity and negative predictive value of 97 to 99%.  At this time, this assay has not been approved by the FDA to exclude DVT/VTE. Results should be correlated with clinical presentation.  Ammonia     Status: None   Collection Time: 07/30/17  4:18 PM  Result Value Ref Range   Ammonia 23 9 - 35 umol/L  Blood gas, arterial     Status: Abnormal   Collection Time: 07/30/17  4:26 PM  Result Value Ref Range   FIO2 0.21    O2 Content 21.0 L/min   Delivery systems ROOM AIR    pH, Arterial 7.481 (H) 7.350 - 7.450   pCO2 arterial 29.2 (L) 32.0 - 48.0 mmHg   pO2, Arterial 85.2 83.0 - 108.0 mmHg   Bicarbonate 23.6 20.0 - 28.0 mmol/L   Acid-base deficit 1.5 0.0 - 2.0 mmol/L   O2 Saturation 96.9 %   Collection site RIGHT RADIAL    Drawn by [phone]    Sample type ARTERIAL    Allens test (pass/fail) PASS PASS  T3, free     Status: Abnormal   Collection Time:  07/30/17  5:26 PM  Result Value Ref Range   T3, Free 1.0 (L) 2.0 - 4.4 pg/mL    Comment: (NOTE) Performed At: [facility] [place], [place] [id number] Lindon Romp MD FU:[id number]   T4, free     Status: None   Collection Time: 07/30/17  5:30 PM  Result Value Ref Range   Free T4 0.97 0.61 - 1.12 ng/dL    Comment: (NOTE) Biotin ingestion may interfere with free T4 tests. If the results are inconsistent with the TSH level, previous test results, or the clinical presentation, then consider biotin interference. If needed, order repeat testing after stopping biotin. Performed at [facility], [place] [address], [place], [place] [address]   MRSA PCR Screening     Status: None   Collection Time: 07/30/17  7:02 PM  Result Value Ref Range   MRSA by PCR NEGATIVE NEGATIVE    Comment:        The GeneXpert MRSA Assay (FDA approved for NASAL specimens only), is one component of a comprehensive MRSA colonization surveillance program. It is not intended to diagnose MRSA infection nor to guide or monitor treatment for MRSA infections.   Troponin I (q 6hr x 3)     Status: Abnormal   Collection Time: 07/30/17 10:20 PM  Result Value Ref Range   Troponin I 0.32 (HH) <0.03 ng/mL    Comment: CRITICAL VALUE NOTED.  VALUE IS CONSISTENT WITH PREVIOUSLY REPORTED AND CALLED VALUE.  Comprehensive metabolic panel     Status: Abnormal   Collection Time: 07/31/17  4:18 AM  Result Value Ref Range   Sodium 134 (L) 135 - 145 mmol/L   Potassium 2.9 (L) 3.5 - 5.1 mmol/L   Chloride 104 101 - 111 mmol/L   CO2 20 (L) 22 - 32 mmol/L   Glucose, Bld 84 65 - 99 mg/dL   BUN 23 (H) 6 - 20 mg/dL   Creatinine, Ser 0.69 0.44 - 1.00 mg/dL   Calcium 8.4 (L) 8.9 - 10.3 mg/dL   Total Protein 5.1 (L) 6.5 - 8.1 g/dL   Albumin 2.5 (L) 3.5 - 5.0 g/dL   AST 22 15 - 41 U/L   ALT 14 14 - 54 U/L   Alkaline Phosphatase 89 38 - 126 U/L   Total Bilirubin 1.0 0.3 - 1.2 mg/dL   GFR calc non Af  Amer >60 >60 mL/min   GFR calc Af Amer >60 >60 mL/min    Comment: (NOTE) The eGFR has been calculated using the CKD EPI equation. This calculation  has not been validated in all clinical situations. eGFR's persistently <60 mL/min signify possible Chronic Kidney Disease.    Anion gap 10 5 - 15  CBC     Status: Abnormal   Collection Time: 07/31/17  4:18 AM  Result Value Ref Range   WBC 8.5 4.0 - 10.5 K/uL   RBC 2.83 (L) 3.87 - 5.11 MIL/uL   Hemoglobin 8.8 (L) 12.0 - 15.0 g/dL   HCT 27.9 (L) 36.0 - 46.0 %   MCV 98.6 78.0 - 100.0 fL   MCH 31.1 26.0 - 34.0 pg   MCHC 31.5 30.0 - 36.0 g/dL   RDW 19.3 (H) 11.5 - 15.5 %   Platelets 216 150 - 400 K/uL  Troponin I (q 6hr x 3)     Status: Abnormal   Collection Time: 07/31/17  4:18 AM  Result Value Ref Range   Troponin I 0.28 (HH) <0.03 ng/mL    Comment: CRITICAL VALUE NOTED.  VALUE IS CONSISTENT WITH PREVIOUSLY REPORTED AND CALLED VALUE.  Magnesium     Status: None   Collection Time: 07/31/17  4:18 AM  Result Value Ref Range   Magnesium 2.0 1.7 - 2.4 mg/dL  Troponin I (q 6hr x 3)     Status: Abnormal   Collection Time: 07/31/17 10:26 AM  Result Value Ref Range   Troponin I 0.31 (HH) <0.03 ng/mL    Comment: CRITICAL RESULT CALLED TO, READ BACK BY AND VERIFIED WITH: COVINGTON,L AT 11:10AM ON 07/31/17 BY FESTERMAN,C     Studies/Results:    FINDINGS: Brain: 2 small foci of subarachnoid hemorrhage at the superior bilateral convexities are unchanged. No new area of hemorrhage. No edema, midline shift or mass effect. No hydrocephalus. Brain parenchyma and CSF-containing spaces are normal for age.  Vascular: No hyperdense vessel or unexpected calcification.  Skull: Normal visualized skull base, calvarium and extracranial soft tissues.  Sinuses/Orbits: No sinus fluid levels or advanced mucosal thickening. No mastoid effusion. Normal orbits.  IMPRESSION: Unchanged examination with 2 small foci of subarachnoid hemorrhage along  both superior frontal convexities. No new hemorrhage. No mass Effect.    The head CT scan is reviewed in person and shows mild to moderate global atrophy. There is increased signal/hypertrophy density consistent with subarachnoid blood involving the anterior right frontal area and the posterior left frontal area. These are 2 tiny areas. No other acute findings are observed.  Lisset Ketchem [name], M.D.  Diplomate, Tax adviser of Psychiatry and Neurology ( Neurology). 07/31/2017, 7:58 PM

## 2017-07-31 NOTE — Plan of Care (Signed)
Call from legal guardian Heriberto Antigua requesting return call.  Call to [facility] to discuss case. We discuss labs and images in detail. We discuss treatment plan in detail. Mateo Flow states that family has mentioned a decline since March of this year. Son Richardson Landry and daughter-in-law Maudie Mercury (who live at the [facility]) state that they have seen/heard monthly declines during their regular phone calls. Mateo Flow ask if there may be some underlying health issue for which she needs to be evaluated. I again discuss imaging and labs, testing that has been done, and suggest we give 24 to 48 hours more for outcomes. Mateo Flow states that Ms. Fleur was able to communicate well even with some confusion during their August meeting. Although she does endorse that she understands dementia can have sudden declines such as this.  Mateo Flow states she prefers that Ms. Searfoss does not return to [employer] [place]. We talk about financial constraint, Mateo Flow states that Mrs. Dobis is private pay at this time. I share that I will reach out to Education officer, museum. Mateo Flow states that she plans to be here tomorrow around 1300, and would also like to speak with Education officer, museum. He also plans to meet with PMT during this time.  We talk about the concept of treating the treatable but, allowing natural death. Mateo Flow endorses DNR. Valerie sets up code word, Pink for patient privacy.  35 minutes

## 2017-08-01 ENCOUNTER — Inpatient Hospital Stay (HOSPITAL_COMMUNITY): Payer: Medicare Other

## 2017-08-01 DIAGNOSIS — F05 Delirium due to known physiological condition: Secondary | ICD-10-CM

## 2017-08-01 DIAGNOSIS — E039 Hypothyroidism, unspecified: Secondary | ICD-10-CM

## 2017-08-01 DIAGNOSIS — I5031 Acute diastolic (congestive) heart failure: Secondary | ICD-10-CM

## 2017-08-01 DIAGNOSIS — I351 Nonrheumatic aortic (valve) insufficiency: Secondary | ICD-10-CM

## 2017-08-01 DIAGNOSIS — J9600 Acute respiratory failure, unspecified whether with hypoxia or hypercapnia: Secondary | ICD-10-CM

## 2017-08-01 LAB — ECHOCARDIOGRAM COMPLETE
AVLVOTPG: 6 mmHg
CHL CUP STROKE VOLUME: 54 mL
CHL CUP TV REG PEAK VELOCITY: 378 cm/s
E decel time: 162 msec
FS: 36 % (ref 28–44)
HEIGHTINCHES: 66 in
IV/PV OW: 1.36
LA diam index: 2.34 cm/m2
LA vol A4C: 89.3 ml
LA vol index: 63.2 mL/m2
LA vol: 94.7 mL
LASIZE: 35 mm
LDCA: 2.27 cm2
LEFT ATRIUM END SYS DIAM: 35 mm
LV dias vol: 92 mL (ref 46–106)
LV sys vol index: 25 mL/m2
LVDIAVOLIN: 61 mL/m2
LVOT SV: 39 mL
LVOT VTI: 17.3 cm
LVOT diameter: 17 mm
LVOTPV: 120 cm/s
LVSYSVOL: 38 mL (ref 14–42)
MV Dec: 162
MV pk E vel: 216 m/s
MVPG: 19 mmHg
PISA EROA: 0.29 cm2
PW: 7.87 mm — AB (ref 0.6–1.1)
RV sys press: 60 mmHg
Simpson's disk: 59
TAPSE: 26 mm
TR max vel: 378 cm/s
VTI: 142 cm
WEIGHTICAEL: 1728 [oz_av]

## 2017-08-01 LAB — CBC WITH DIFFERENTIAL/PLATELET
Basophils Absolute: 0 10*3/uL (ref 0.0–0.1)
Basophils Relative: 0 %
EOS ABS: 0 10*3/uL (ref 0.0–0.7)
Eosinophils Relative: 0 %
HEMATOCRIT: 27.5 % — AB (ref 36.0–46.0)
HEMOGLOBIN: 9.1 g/dL — AB (ref 12.0–15.0)
LYMPHS ABS: 0.5 10*3/uL — AB (ref 0.7–4.0)
Lymphocytes Relative: 5 %
MCH: 31.7 pg (ref 26.0–34.0)
MCHC: 33.1 g/dL (ref 30.0–36.0)
MCV: 95.8 fL (ref 78.0–100.0)
MONO ABS: 0.9 10*3/uL (ref 0.1–1.0)
MONOS PCT: 9 %
NEUTROS PCT: 86 %
Neutro Abs: 8.7 10*3/uL — ABNORMAL HIGH (ref 1.7–7.7)
Platelets: 222 10*3/uL (ref 150–400)
RBC: 2.87 MIL/uL — ABNORMAL LOW (ref 3.87–5.11)
RDW: 19.6 % — AB (ref 11.5–15.5)
WBC: 10.1 10*3/uL (ref 4.0–10.5)

## 2017-08-01 LAB — COMPREHENSIVE METABOLIC PANEL
ALBUMIN: 2.4 g/dL — AB (ref 3.5–5.0)
ALK PHOS: 86 U/L (ref 38–126)
ALT: 13 U/L — AB (ref 14–54)
AST: 19 U/L (ref 15–41)
Anion gap: 7 (ref 5–15)
BUN: 19 mg/dL (ref 6–20)
CALCIUM: 8.5 mg/dL — AB (ref 8.9–10.3)
CHLORIDE: 108 mmol/L (ref 101–111)
CO2: 20 mmol/L — AB (ref 22–32)
CREATININE: 0.7 mg/dL (ref 0.44–1.00)
GFR calc non Af Amer: >60 mL/min (ref >60)
GLUCOSE: 118 mg/dL — AB (ref 65–99)
Potassium: 4 mmol/L (ref 3.5–5.1)
SODIUM: 135 mmol/L (ref 135–145)
Total Bilirubin: 1.1 mg/dL (ref 0.3–1.2)
Total Protein: 4.8 g/dL — ABNORMAL LOW (ref 6.5–8.1)

## 2017-08-01 LAB — VITAMIN B12: Vitamin B-12: 1019 pg/mL — ABNORMAL HIGH (ref 180–914)

## 2017-08-01 LAB — MAGNESIUM: Magnesium: 2 mg/dL (ref 1.7–2.4)

## 2017-08-01 MED ORDER — LORAZEPAM 2 MG/ML IJ SOLN
0.5000 mg | Freq: Four times a day (QID) | INTRAMUSCULAR | Status: DC | PRN
  Administered 2017-08-01 – 2017-08-02 (×2): 1 mg via INTRAVENOUS
  Filled 2017-08-01 (×2): qty 1

## 2017-08-01 MED ORDER — BISACODYL 10 MG RE SUPP
10.0000 mg | Freq: Every day | RECTAL | Status: DC | PRN
Start: 2017-08-01 — End: 2017-08-02
  Filled 2017-08-01: qty 1

## 2017-08-01 MED ORDER — LORAZEPAM 2 MG/ML IJ SOLN
0.5000 mg | INTRAMUSCULAR | Status: DC | PRN
  Administered 2017-08-01: 1 mg via INTRAVENOUS
  Filled 2017-08-01: qty 1

## 2017-08-01 MED ORDER — DONEPEZIL HCL 5 MG PO TABS
5.0000 mg | ORAL_TABLET | Freq: Every day | ORAL | Status: DC
  Administered 2017-08-01: 5 mg via ORAL
  Filled 2017-08-01: qty 1

## 2017-08-01 NOTE — Clinical Social Work Note (Signed)
Clinical Social Work Assessment  Patient Details  Name: [name] MRN: [id number] Date of Birth: May 25, 1928  Date of referral:  08/01/17               Reason for consult:  Discharge Planning                Permission sought to share information with:    Permission granted to share information::     Name::        Agency::     Relationship::     Contact Information:     Housing/Transportation Living arrangements for the past 2 months:  [place] of Information:  Guardian (Heriberto Antigua, [place].) Patient Interpreter Needed:  None Criminal Activity/Legal Involvement Pertinent to Current Situation/Hospitalization:  No - Comment as needed Significant Relationships:  Adult Children, Other Family Members Lives with:  Facility Resident Do you feel safe going back to the place where you live?  Yes Need for family participation in patient care:  Yes (Comment)  Care giving concerns:  Patient is total care at [facility].    Social Worker assessment / plan:  Patient has been a resident at [facility] for the past few week. She was sent to the facility from [facility]. Patient will be long term.  Carleene Cooper indicates that patient can return at discharge. Guardian representative, Heriberto Antigua, is in agreement with patient returning at discharge.  LCSW will follow and facilitate discharge when medically stable.   Employment status:  Retired Forensic scientist:  Medicare PT Recommendations:  Not assessed at this time Information / Referral to community resources:     Patient/Family's Response to care:  Guardian is agreeable for patient to return to [facility] at discharge.   Patient/Family's Understanding of and Emotional Response to Diagnosis, Current Treatment, and Prognosis:  Guardian understands patient's diagnosis, treatment and diagnosis.   Emotional  Assessment Appearance:  Appears stated age Attitude/Demeanor/Rapport:    Affect (typically observed):  Unable to Assess Orientation:  Oriented to Self Alcohol / Substance use:  Not Applicable Psych involvement (Current and /or in the community):     Discharge Needs  Concerns to be addressed:  No discharge needs identified Readmission within the last 30 days:  No Current discharge risk:  Chronically ill Barriers to Discharge:  No Barriers Identified   Ihor Gully, LCSW 08/01/2017, 2:27 PM

## 2017-08-01 NOTE — Progress Notes (Signed)
PROGRESS NOTE  Stasha Naraine [facility]  UXN:[id number]  DOB: [birth year]  DOA: 07/30/2017 PCP: Chipper Herb, MD  Brief Admission Hx: 83 y/o female who presented from the emergency department with encephalopathy and severe agitation.   MDM/Assessment & Plan:   1. Acute metabolic encephalopathy - continues to be agitated and does not answer questions/follow commands. Neurology consulted and feels that her encephalopathy is likely related to acute illness including dehydration, congestive heart failure and dyspnea. She has had progressive decline in recent months. Palliative care also consulted and met with patient's guardian. The patient does not become awake enough to eat and drink, they're considering hospice care. PEG tube is not being considered.  2. Small SAH - hold aspirin, hold heparin.  Felt to be related to trauma. No further imaging per neurology.   3. Acute exacerbation of chronic diastolic CHF - appreciate assistance of cardiology team, although she was noted to have a mild degree of pulmonary vascular congestion, it was felt that she may be intravascularly dry. She has severe mitral and moderate aortic and tricuspid regurgitation. Due to her frailty, age and comorbidities, she was not felt to be an ideal operative candidate and would be at risk for diastolic decompensation. It was recommended that she be gently hydrated to see this helps her altered mental status. Should her breathing worsen, can consider IV diuresis.    4. Acute respiratory failure - CTA ruled out PE but there was a medium sized left pleural effusion.  I am concerned about attempting thoracentesis in the patient's current state of confusion and agitation but could consider this if that improves some.   5. Hypokalemia - replaced. Magnesium normal 6. Hypothyroidism - mildly elevated TSH, but normal Free T4, continue levothyroxine daily.   DVT prophylaxis: SCDs Code Status: DNR Family Communication: guardian  called Disposition Plan: guardian is considering hospice if patient does not improved  Consultants:  Palliative medicine  Cardiology    neurology  Subjective: Does not answer questions or follow commands. Moans to voice  Objective: Vitals:   07/31/17 2016 07/31/17 2041 08/01/17 0447 08/01/17 1430  BP:  (!) 121/42 (!) 141/65 (!) 136/58  Pulse:  94 (!) 110 (!) 101  Resp:  16 18 16   Temp:  98.4 F (36.9 C) 98 F (36.7 C) 98.3 F (36.8 C)  TempSrc:  Oral Axillary Axillary  SpO2: 94% 99% 93% 95%  Weight:      Height:        Intake/Output Summary (Last 24 hours) at 08/01/17 1819 Last data filed at 08/01/17 1300  Gross per 24 hour  Intake            707.5 ml  Output                0 ml  Net            707.5 ml   Filed Weights   07/30/17 1247 07/30/17 1835  Weight: 49.9 kg (110 lb) 49 kg (108 lb)     Exam:  General exam: thin, agitated female, NAD.  Respiratory system: Clear. No increased work of breathing. Cardiovascular system: S1 & S2 heard.   Gastrointestinal system: Abdomen is nondistended, soft and nontender. Normal bowel sounds heard. Central nervous system: agitated, does not open eyes to voice.  No focal neurological deficits. Extremities: no CCE.  Data Reviewed: Basic Metabolic Panel:  Recent Labs Lab 07/30/17 1306 07/31/17 0418 07/31/17 2248 08/01/17 0608  NA 134* 134* 134* 135  K 3.3* 2.9*  3.7 4.0  CL 100* 104 104 108  CO2 23 20* 21* 20*  GLUCOSE 107* 84 109* 118*  BUN 27* 23* 21* 19  CREATININE 0.81 0.69 0.72 0.70  CALCIUM 8.8* 8.4* 8.6* 8.5*  MG  --  2.0  --  2.0   Liver Function Tests:  Recent Labs Lab 07/30/17 1306 07/31/17 0418 08/01/17 0608  AST 23 22 19   ALT 13* 14 13*  ALKPHOS 92 89 86  BILITOT 1.1 1.0 1.1  PROT 5.3* 5.1* 4.8*  ALBUMIN 2.7* 2.5* 2.4*    Recent Labs Lab 07/30/17 1306  LIPASE 28    Recent Labs Lab 07/30/17 1618  AMMONIA 23   CBC:  Recent Labs Lab 07/30/17 1306 07/31/17 0418 08/01/17 0608   WBC 9.8 8.5 10.1  NEUTROABS 8.0*  --  8.7*  HGB 9.1* 8.8* 9.1*  HCT 27.5* 27.9* 27.5*  MCV 95.8 98.6 95.8  PLT 220 216 222   Cardiac Enzymes:  Recent Labs Lab 07/30/17 1358 07/30/17 2220 07/31/17 0418 07/31/17 1026  TROPONINI 0.40* 0.32* 0.28* 0.31*   CBG (last 3)  No results for input(s): GLUCAP in the last 72 hours. Recent Results (from the past 240 hour(s))  Urine Culture     Status: None (Preliminary result)   Collection Time: 07/30/17 12:51 PM  Result Value Ref Range Status   Specimen Description URINE, CATHETERIZED  Final   Special Requests NONE  Final   Culture   Final    CULTURE REINCUBATED FOR BETTER GROWTH Performed at [facility], [address], [place], [place] [address]    Report Status PENDING  Incomplete  MRSA PCR Screening     Status: None   Collection Time: 07/30/17  7:02 PM  Result Value Ref Range Status   MRSA by PCR NEGATIVE NEGATIVE Final    Comment:        The GeneXpert MRSA Assay (FDA approved for NASAL specimens only), is one component of a comprehensive MRSA colonization surveillance program. It is not intended to diagnose MRSA infection nor to guide or monitor treatment for MRSA infections.      Studies: Ct Head Wo Contrast  Result Date: 07/31/2017 CLINICAL DATA:  Altered mental status. Follow-up intracranial hemorrhage. EXAM: CT HEAD WITHOUT CONTRAST TECHNIQUE: Contiguous axial images were obtained from the base of the skull through the vertex without intravenous contrast. COMPARISON:  Head CT 07/30/2017 FINDINGS: Brain: 2 small foci of subarachnoid hemorrhage at the superior bilateral convexities are unchanged. No new area of hemorrhage. No edema, midline shift or mass effect. No hydrocephalus. Brain parenchyma and CSF-containing spaces are normal for age. Vascular: No hyperdense vessel or unexpected calcification. Skull: Normal visualized skull base, calvarium and extracranial soft tissues. Sinuses/Orbits: No sinus fluid levels  or advanced mucosal thickening. No mastoid effusion. Normal orbits. IMPRESSION: Unchanged examination with 2 small foci of subarachnoid hemorrhage along both superior frontal convexities. No new hemorrhage. No mass effect. Electronically Signed   By: Ulyses Jarred M.D.   On: 07/31/2017 19:42   Scheduled Meds: . donepezil  5 mg Oral QHS  . feeding supplement (ENSURE ENLIVE)  237 mL Oral BID BM  . levothyroxine  88 mcg Oral QAC breakfast  . multivitamin with minerals  1 tablet Oral Daily  . sertraline  100 mg Oral Daily   Continuous Infusions:   Active Problems:   Hypothyroidism   Encephalopathy   Acute diastolic CHF (congestive heart failure) (HCC)   Acute respiratory failure (HCC)   Elevated troponin  Congestive heart failure ([place])   Delirium due to another medical condition   Palliative care encounter   Goals of care, counseling/discussion   Protein-calorie malnutrition, severe  Time spent: 2mns  Ameyah Bangura, MD [facility] Pager [phone] If 7PM-7AM, please contact night-coverage [email or web address] Password [email or web address] 08/01/2017, 6:19 PM    LOS: 2 days

## 2017-08-01 NOTE — Progress Notes (Signed)
*  PRELIMINARY RESULTS* Echocardiogram 2D Echocardiogram has been performed.  [name] 08/01/2017, 3:20 PM

## 2017-08-01 NOTE — Progress Notes (Signed)
Pt would not swallow last dose of PO K+. MD notified. Order for STAT BMET. Potassium is 3.7  Margaret Pyle, RN

## 2017-08-01 NOTE — NC FL2 (Signed)
[place] LEVEL OF CARE SCREENING TOOL     IDENTIFICATION  Patient Name: [name] [id number] Birthdate: 1928/09/22 Sex: female Admission Date (Current Location): 07/30/2017  [facility] and [place] Number:  [employer] and Address:  [facility] [address] [place]      Provider Number: [id number]  Attending Physician Name and Address:  Kathie Dike, MD  Relative Name and Phone Number:       Current Level of Care: Hospital Recommended Level of Care: [place] Prior Approval Number:    Date Approved/Denied:   PASRR Number:    Discharge Plan: SNF    Current Diagnoses: Patient Active Problem List   Diagnosis Date Noted  . Protein-calorie malnutrition, severe 07/31/2017  . Congestive heart failure ([place])   . Delirium due to another medical condition   . Palliative care encounter   . Goals of care, counseling/discussion   . Encephalopathy 07/30/2017  . Acute diastolic CHF (congestive heart failure) ([place]) 07/30/2017  . Acute respiratory failure ([place]) 07/30/2017  . Elevated troponin 07/30/2017  . Aortic atherosclerosis ([place]) 04/13/2017  . Varicose veins of leg with complications   . Bleeding from varicose vein 01/05/2015  . Acute blood loss anemia 01/05/2015  . Stokes-Adams syncope   . Complete heart block ([place])   . Aortic insufficiency   . Hypertension   . Mitral regurgitation 12/08/2014  . Palpitations 11/11/2014  . SOB (shortness of breath) 11/11/2014  . Cardiac pacemaker in situ 07/30/2014  . Adenomatous polyp of rectum 07/30/2014  . Osteopenia of the elderly 03/25/2014  . Systolic ejection murmur 14/78/2956  . History of pacemaker 09/04/2013  . Near syncope 08/29/2013  . Fall at home, with facial injury 08/29/2013  . UTI (lower urinary tract infection) 08/29/2013  . Sigmoid diverticulitis 04/23/2013  . Hypothyroidism 04/23/2013  . Chronic back pain 04/23/2013  . Villous adenoma of rectum  01/16/2013  . Mass of left lobe of liver 01/16/2013    Orientation RESPIRATION BLADDER Height & Weight     Self  Normal Incontinent Weight: 108 lb (49 kg) Height:  5\' 6"  (167.6 cm)  BEHAVIORAL SYMPTOMS/MOOD NEUROLOGICAL BOWEL NUTRITION STATUS      Incontinent Diet (Heart Healthy)  AMBULATORY STATUS COMMUNICATION OF NEEDS Skin   Total Care Verbally Normal                       Personal Care Assistance Level of Assistance  Total care           Functional Limitations Info  Sight, Hearing, Speech Sight Info: Adequate Hearing Info: Adequate Speech Info: Adequate    SPECIAL CARE FACTORS FREQUENCY                       Contractures      Additional Factors Info  Code Status, Psychotropic Code Status Info: DNR   Psychotropic Info: Ativan         Current Medications (08/01/2017):  This is the current hospital active medication list Current Facility-Administered Medications  Medication Dose Route Frequency Provider Last Rate Last Dose  . bisacodyl (DULCOLAX) suppository 10 mg  10 mg Rectal Daily PRN Dove, Tasha A, NP      . donepezil (ARICEPT) tablet 5 mg  5 mg Oral QHS [place], Kofi, MD      . feeding supplement (ENSURE ENLIVE) (ENSURE ENLIVE) liquid 237 mL  237 mL Oral BID BM Johnson, Clanford L, MD  237 mL at 07/31/17 1657  . levothyroxine (SYNTHROID, LEVOTHROID) tablet 88 mcg  88 mcg Oral QAC breakfast Johnson, Clanford L, MD      . lip balm (BLISTEX) ointment   Topical PRN Wynetta Emery, Clanford L, MD   1 application at 14/10/30 1705  . LORazepam (ATIVAN) injection 0.5-1 mg  0.5-1 mg Intravenous Q6H PRN Dove, Tasha A, NP      . multivitamin with minerals tablet 1 tablet  1 tablet Oral Daily Johnson, Clanford L, MD   1 tablet at 07/31/17 1019  . oxyCODONE (Oxy IR/ROXICODONE) immediate release tablet 5 mg  5 mg Oral Q6H PRN Johnson, Clanford L, MD   5 mg at 07/31/17 1018  . sertraline (ZOLOFT) tablet 100 mg  100 mg Oral Daily Johnson, Clanford L, MD   100 mg at  07/31/17 1018     Discharge Medications: Please see discharge summary for a list of discharge medications.  Relevant Imaging Results:  Relevant Lab Results:   Additional Information    Illa Enlow, Clydene Pugh, LCSW

## 2017-08-01 NOTE — Progress Notes (Signed)
Daily Progress Note   Patient Name: [name]       Date: 08/01/2017 DOB: 1928/03/08  Age: 83 y.o. MRN#: [id number] Attending Physician: Kathie Dike, MD Primary Care Physician: Chipper Herb, MD Admit Date: 07/30/2017  Reason for Consultation/Follow-up: Establishing goals of care and Psychosocial/spiritual support  Subjective: [name] is lying in bed. She does not respond to voice or touch, other than to moan. She does not open her eyes. Ultrasounds present at bedside performing Echo, which she is tolerating well.  Meeting with "[facility] [employer]" legal guardian, [name]. We talk in detail about current labs which all seem relatively normal, even good for an 83 year old person.   We talk in detail about Mrs. Capurro's health including her G.I. History.  Upon review of chart we find a note from G.I. From November 10, 2013 visit that speaks to a liver lesion, lung nodule and "Rectal villous adenoma -- her rectal villous adenoma continues to be her biggest issue, though despite multiple discussions with me about this lesion in the past, she has adamantly not wanted surgery for resection. I have discussed with her the (repeat) CT shows further thickening in the rectum which very likely corresponds to increasing size of this lesion.  She continues to feel well and from a GI standpoint is asymptomatic, and this has prompted me to continue to offer her at least a discussion about resection and what this would mean for her both from a surgery standpoint and a recovery standpoint.  She continues to feel strongly that she likely would not want any "cutting operation"."  Upon discussing this with [name], she chooses to allow time to see if Mrs. Grunwald's encephalopathy improves. She  states that they would not place PEG tube. [name] states that if  Mrs. Tow does not improve, son Annie Main as stated that he would like to focus on comfort and dignity, and [name] states that they would prefer hospice home placement. I share that I will keep her updated.   Length of Stay: 2  Current Medications: Scheduled Meds:  . donepezil  5 mg Oral QHS  . feeding supplement (ENSURE ENLIVE)  237 mL Oral BID BM  . levothyroxine  88 mcg Oral QAC breakfast  . multivitamin with minerals  1 tablet Oral Daily  . sertraline  100 mg  Oral Daily    Continuous Infusions:   PRN Meds: bisacodyl, lip balm, LORazepam, oxyCODONE  Physical Exam  Constitutional: No distress.  lying quietly in bed,does not respond to voice or touch. Appears frail, acutely ill  HENT:  Head: Atraumatic.  Cardiovascular: Normal rate.   Pulmonary/Chest: Effort normal. No respiratory distress.  Abdominal: Soft. She exhibits no distension.  Neurological:  Does not respond to voice or touch, known dementia  Skin: Skin is warm and dry.  Nursing note and vitals reviewed.           Vital Signs: BP (!) 136/58 (BP Location: Left Arm)   Pulse (!) 101   Temp 98.3 F (36.8 C) (Axillary)   Resp 16   Ht 5\' 6"  (1.676 m)   Wt 49 kg (108 lb)   SpO2 95%   BMI 17.43 kg/m  SpO2: SpO2: 95 % O2 Device: O2 Device: Not Delivered O2 Diaz Rate:    Intake/output summary:  Intake/Output Summary (Last 24 hours) at 08/01/17 1534 Last data filed at 08/01/17 1300  Gross per 24 hour  Intake            807.5 ml  Output              600 ml  Net            207.5 ml   LBM: Last BM Date:  (pt unable to tell) Baseline Weight: Weight: 49.9 kg (110 lb) Most recent weight: Weight: 49 kg (108 lb)       Palliative Assessment/Data:    Flowsheet Rows     Most Recent Value  Intake Tab  Referral Department  Hospitalist  Unit at Time of Referral  Med/Surg Unit  Palliative Care Primary Diagnosis  Other (Comment)  Date Notified   07/31/17  Palliative Care Type  New Palliative care  Reason for referral  Clarify Goals of Care  Date of Admission  07/30/17  Date first seen by Palliative Care  07/31/17  # of days Palliative referral response time  0 Day(s)  # of days IP prior to Palliative referral  1  Clinical Assessment  Palliative Performance Scale Score  30%  Pain Max last 24 hours  Not able to report  Pain Min Last 24 hours  Not able to report  Dyspnea Max Last 24 Hours  Not able to report  Dyspnea Min Last 24 hours  Not able to report  Psychosocial & Spiritual Assessment  Palliative Care Outcomes  Patient/Family meeting held?  Yes  Who was at the meeting?  legal guardian, [name], via phone  Palliative Care Outcomes  Provided advance care planning, Provided psychosocial or spiritual support, Clarified goals of care      Patient Active Problem List   Diagnosis Date Noted  . Protein-calorie malnutrition, severe 07/31/2017  . Congestive heart failure ([place])   . Delirium due to another medical condition   . Palliative care encounter   . Goals of care, counseling/discussion   . Encephalopathy 07/30/2017  . Acute diastolic CHF (congestive heart failure) ([place]) 07/30/2017  . Acute respiratory failure ([place]) 07/30/2017  . Elevated troponin 07/30/2017  . Aortic atherosclerosis ([place]) 04/13/2017  . Varicose veins of leg with complications   . Bleeding from varicose vein 01/05/2015  . Acute blood loss anemia 01/05/2015  . Stokes-Adams syncope   . Complete heart block ([place])   . Aortic insufficiency   . Hypertension   . Mitral regurgitation 12/08/2014  . Palpitations 11/11/2014  . SOB (  shortness of breath) 11/11/2014  . Cardiac pacemaker in situ 07/30/2014  . Adenomatous polyp of rectum 07/30/2014  . Osteopenia of the elderly 03/25/2014  . Systolic ejection murmur 83/38/2505  . History of pacemaker 09/04/2013  . Near syncope 08/29/2013  . Fall at home, with facial injury 08/29/2013  . UTI (lower  urinary tract infection) 08/29/2013  . Sigmoid diverticulitis 04/23/2013  . Hypothyroidism 04/23/2013  . Chronic back pain 04/23/2013  . Villous adenoma of rectum 01/16/2013  . Mass of left lobe of liver 01/16/2013    Palliative Care Assessment & Plan   Patient Profile: 83 y.o. female  with past medical history of diastolic heart failure, complete heart block with pacemaker, mitral regurgitation, aortic insufficiency, hypothyroidism, history of lung nodule and liver lesion, diverticulitis, history of UTI admitted on 07/30/2017 with encephalopathy that is likely multifactorial.   Assessment: encephalopathy that is likely multifactorial. Renal function, no signs of infection, worsening encephalopathy. Neurology consult completed. G.I. Consult from November 10, 2013 notes: Rectal villous adenoma -- her rectal villous adenoma continues to be her biggest issue, though despite multiple discussions with me about this lesion in the past, she has adamantly not wanted surgery for resection. I have discussed with her the CT shows further thickening in the rectum which very likely corresponds to increasing size of this lesion.  She continues to feel well and from a GI standpoint is asymptomatic, and this has prompted me to continue to offer her at least a discussion about resection and what this would mean for her both from a surgery standpoint and a recovery standpoint.  She continues to feel strongly that she likely would not want any "cutting operation".    Recommendations/Plan: At this point, 24 to 48 hours for outcomes. If Mrs. Izard is unable to either drink, legal guardian declines PEG tube, and desires comfort measures only - transfer to hospice home for comfort at end-of-life.  Goals of Care and Additional Recommendations:  Limitations on Scope of Treatment: No CPR, no intubation, no PEG tube  Code Status:    Code Status Orders        Start     Ordered   07/31/17 1531  Do not attempt  resuscitation (DNR)  Continuous    Question Answer Comment  In the event of cardiac or respiratory ARREST Do not call a "code blue"   In the event of cardiac or respiratory ARREST Do not perform Intubation, CPR, defibrillation or ACLS   In the event of cardiac or respiratory ARREST Use medication by any route, position, wound care, and other measures to relive pain and suffering. May use oxygen, suction and manual treatment of airway obstruction as needed for comfort.      07/31/17 1530    Code Status History    Date Active Date Inactive Code Status Order ID Comments User Context   07/30/2017  4:23 PM 07/31/2017  3:30 PM Full Code [id number]  Deneise Lever, MD ED   01/05/2015  7:01 PM 01/06/2015  4:08 PM Full Code [id number]  Katheren Shams, DO Inpatient   09/01/2013  6:17 PM 09/02/2013  7:31 PM Full Code [id number]  [name], MD Inpatient   08/29/2013  9:33 PM 09/01/2013  6:17 PM Full Code [id number]  Modena Jansky, MD Inpatient       Prognosis:   < 4 weeks, or less if Mrs. Vitolo continues to remain unresponsive, unable to eat or drink.  Discharge Planning:  To Be Determined  Care  plan was discussed with nursing staff, case management, social worker, [facility] lives legal guardian, and Dr. Roderic Palau.   Thank you for allowing the Palliative Medicine Team to assist in the care of this patient.   Time In: 1300 Time Out: 1410 Total Time 70 minutes Prolonged Time Billed  yes       Greater than 50%  of this time was spent counseling and coordinating care related to the above assessment and plan.  Drue Novel, NP  Please contact Palliative Medicine Team phone at [phone] for questions and concerns.

## 2017-08-02 DIAGNOSIS — G9340 Encephalopathy, unspecified: Secondary | ICD-10-CM

## 2017-08-02 LAB — CBC WITH DIFFERENTIAL/PLATELET
BASOS ABS: 0 10*3/uL (ref 0.0–0.1)
BASOS PCT: 0 %
EOS ABS: 0 10*3/uL (ref 0.0–0.7)
Eosinophils Relative: 0 %
HCT: 30.7 % — ABNORMAL LOW (ref 36.0–46.0)
HEMOGLOBIN: 9.9 g/dL — AB (ref 12.0–15.0)
Lymphocytes Relative: 6 %
Lymphs Abs: 0.8 10*3/uL (ref 0.7–4.0)
MCH: 31.4 pg (ref 26.0–34.0)
MCHC: 32.2 g/dL (ref 30.0–36.0)
MCV: 97.5 fL (ref 78.0–100.0)
Monocytes Absolute: 0.8 10*3/uL (ref 0.1–1.0)
Monocytes Relative: 6 %
NEUTROS PCT: 88 %
Neutro Abs: 12.3 10*3/uL — ABNORMAL HIGH (ref 1.7–7.7)
Platelets: 271 10*3/uL (ref 150–400)
RBC: 3.15 MIL/uL — AB (ref 3.87–5.11)
RDW: 20.1 % — ABNORMAL HIGH (ref 11.5–15.5)
WBC: 13.9 10*3/uL — AB (ref 4.0–10.5)

## 2017-08-02 LAB — COMPREHENSIVE METABOLIC PANEL
ALK PHOS: 91 U/L (ref 38–126)
ALT: 16 U/L (ref 14–54)
ANION GAP: 12 (ref 5–15)
AST: 28 U/L (ref 15–41)
Albumin: 2.6 g/dL — ABNORMAL LOW (ref 3.5–5.0)
BUN: 22 mg/dL — ABNORMAL HIGH (ref 6–20)
CALCIUM: 8.9 mg/dL (ref 8.9–10.3)
CO2: 18 mmol/L — ABNORMAL LOW (ref 22–32)
CREATININE: 0.76 mg/dL (ref 0.44–1.00)
Chloride: 108 mmol/L (ref 101–111)
Glucose, Bld: 128 mg/dL — ABNORMAL HIGH (ref 65–99)
Potassium: 4.1 mmol/L (ref 3.5–5.1)
Sodium: 138 mmol/L (ref 135–145)
TOTAL PROTEIN: 5.4 g/dL — AB (ref 6.5–8.1)
Total Bilirubin: 1.4 mg/dL — ABNORMAL HIGH (ref 0.3–1.2)

## 2017-08-02 LAB — URINE CULTURE: Culture: 60000 — AB

## 2017-08-02 LAB — RPR: RPR Ser Ql: NONREACTIVE

## 2017-08-02 LAB — MAGNESIUM: MAGNESIUM: 2.1 mg/dL (ref 1.7–2.4)

## 2017-08-02 LAB — HOMOCYSTEINE: Homocysteine: 13.1 umol/L (ref 0.0–15.0)

## 2017-08-02 MED ORDER — MORPHINE SULFATE (CONCENTRATE) 10 MG/0.5ML PO SOLN: 2.5000 mg | ORAL | Status: DC | PRN

## 2017-08-02 MED ORDER — LORAZEPAM 0.5 MG PO TABS: 0.5000 mg | ORAL_TABLET | Freq: Two times a day (BID) | ORAL | 0 refills | Status: AC

## 2017-08-02 MED ORDER — BLISTEX MEDICATED EX OINT: 1.0000 "application " | TOPICAL_OINTMENT | CUTANEOUS | Status: AC | PRN

## 2017-08-02 MED ORDER — MORPHINE SULFATE (CONCENTRATE) 10 MG/0.5ML PO SOLN
2.5000 mg | ORAL | Status: DC
  Administered 2017-08-02 (×2): 2.6 mg via ORAL
  Filled 2017-08-02 (×2): qty 0.5

## 2017-08-02 MED ORDER — MORPHINE SULFATE (CONCENTRATE) 10 MG/0.5ML PO SOLN: 2.5000 mg | ORAL | 0 refills | Status: AC

## 2017-08-02 MED ORDER — BISACODYL 10 MG RE SUPP: 10.0000 mg | Freq: Every day | RECTAL | 0 refills | Status: AC | PRN

## 2017-08-02 MED ORDER — MORPHINE SULFATE (CONCENTRATE) 10 MG/0.5ML PO SOLN: 2.5000 mg | ORAL | 0 refills | Status: AC | PRN

## 2017-08-02 NOTE — Progress Notes (Signed)
Daily Progress Note   Patient Name: [name]       Date: 08/02/2017 DOB: 02-Oct-1928  Age: 83 y.o. MRN#: [id number] Attending Physician: Kathie Dike, MD Primary Care Physician: Chipper Herb, MD Admit Date: 07/30/2017  Reason for Consultation/Follow-up: Establishing goals of care, Inpatient hospice referral and Psychosocial/spiritual support  Subjective: Call from legal guardian, Mateo Flow for update. Advised not in hospital this am and will call/email with updates.   [name] is lying quietly in bed.  She does not respond at all.  She looks relatively comfortable. No family at bedside.   Per Guardian's wishes, due to decline, [name] will be transitioned to comfort care, and request in patient bed at [facility] home.   Conference with nursing regarding symptom management. Conference with SW regarding in patient hospice.   Length of Stay: 3  Current Medications: Scheduled Meds:  . donepezil  5 mg Oral QHS  . feeding supplement (ENSURE ENLIVE)  237 mL Oral BID BM  . levothyroxine  88 mcg Oral QAC breakfast  . morphine  2.5 mg Oral Q4H  . multivitamin with minerals  1 tablet Oral Daily  . sertraline  100 mg Oral Daily    Continuous Infusions:   PRN Meds: bisacodyl, lip balm, LORazepam, morphine, oxyCODONE  Physical Exam  Constitutional: No distress.  increased RR rate, does not respond  Cardiovascular: Normal rate and regular rhythm.   Pulmonary/Chest:  Increased rr rate  Abdominal: Soft. She exhibits no distension.  Neurological:  unresponsive  Skin: Skin is warm and dry.  Nursing note and vitals reviewed.           Vital Signs: BP 128/73 (BP Location: Right Arm)   Pulse (!) 102 Comment: Nurse Lilia Pro notified  Temp 99.9 F (37.7 C) (Axillary)    Resp (!) 23 Comment: Nurse Lilia Pro notified  Ht 5\' 6"  (1.676 m)   Wt 49 kg (108 lb)   SpO2 92%   BMI 17.43 kg/m  SpO2: SpO2: 92 % O2 Device: O2 Device: Nasal Cannula O2 Flow Rate: O2 Flow Rate (L/min): 2 L/min  Intake/output summary:  Intake/Output Summary (Last 24 hours) at 08/02/17 1356 Last data filed at 08/02/17 1300  Gross per 24 hour  Intake                0  ml  Output                0 ml  Net                0 ml   LBM: Last BM Date:  (pt unable to tell) Baseline Weight: Weight: 49.9 kg (110 lb) Most recent weight: Weight: 49 kg (108 lb)       Palliative Assessment/Data:    Flowsheet Rows     Most Recent Value  Intake Tab  Referral Department  Hospitalist  Unit at Time of Referral  Med/Surg Unit  Palliative Care Primary Diagnosis  Other (Comment)  Date Notified  07/31/17  Palliative Care Type  New Palliative care  Reason for referral  Clarify Goals of Care  Date of Admission  07/30/17  Date first seen by Palliative Care  07/31/17  # of days Palliative referral response time  0 Day(s)  # of days IP prior to Palliative referral  1  Clinical Assessment  Palliative Performance Scale Score  30%  Pain Max last 24 hours  Not able to report  Pain Min Last 24 hours  Not able to report  Dyspnea Max Last 24 Hours  Not able to report  Dyspnea Min Last 24 hours  Not able to report  Psychosocial & Spiritual Assessment  Palliative Care Outcomes  Patient/Family meeting held?  Yes  Who was at the meeting?  legal guardian, Heriberto Antigua, via phone  Palliative Care Outcomes  Provided advance care planning, Provided psychosocial or spiritual support, Clarified goals of care      Patient Active Problem List   Diagnosis Date Noted  . Protein-calorie malnutrition, severe 07/31/2017  . Congestive heart failure ([place])   . Delirium due to another medical condition   . Palliative care encounter   . Goals of care, counseling/discussion   . Encephalopathy 07/30/2017  . Acute  diastolic CHF (congestive heart failure) ([place]) 07/30/2017  . Acute respiratory failure ([place]) 07/30/2017  . Elevated troponin 07/30/2017  . Aortic atherosclerosis ([place]) 04/13/2017  . Varicose veins of leg with complications   . Bleeding from varicose vein 01/05/2015  . Acute blood loss anemia 01/05/2015  . Stokes-Adams syncope   . Complete heart block ([place])   . Aortic insufficiency   . Hypertension   . Mitral regurgitation 12/08/2014  . Palpitations 11/11/2014  . SOB (shortness of breath) 11/11/2014  . Cardiac pacemaker in situ 07/30/2014  . Adenomatous polyp of rectum 07/30/2014  . Osteopenia of the elderly 03/25/2014  . Systolic ejection murmur 86/76/1950  . History of pacemaker 09/04/2013  . Near syncope 08/29/2013  . Fall at home, with facial injury 08/29/2013  . UTI (lower urinary tract infection) 08/29/2013  . Sigmoid diverticulitis 04/23/2013  . Hypothyroidism 04/23/2013  . Chronic back pain 04/23/2013  . Villous adenoma of rectum 01/16/2013  . Mass of left lobe of liver 01/16/2013    Palliative Care Assessment & Plan   Patient Profile: 83 y.o.femalewith past medical history of diastolic heart failure, complete heart block with pacemaker, mitral regurgitation, aortic insufficiency, hypothyroidism, history of lung nodule and liver lesion, diverticulitis, history of UTI admitted on 10/1/2018with encephalopathy that is likely multifactorial.   Assessment: encephalopathy that is likely multifactorial. Renal function, no signs of infection, worsening encephalopathy. Neurology consult completed. G.I. Consult from November 10, 2013 notes: Rectal villous adenoma -- her rectal villous adenoma continues to be her biggest issue, though despite multiple discussions with me about this lesion in the past, she  has adamantly not wanted surgery for resection. I have discussed with her the CT shows further thickening in the rectum which very likely corresponds to increasing size of this  lesion. She continues to feel well and from a GI standpoint is asymptomatic, and this has prompted me to continue to offer her at least a discussion about resection and what this would mean for her both from a surgery standpoint and a recovery standpoint. She continues to feel strongly that she likely would not want any "cutting operation".    UNRESPONSIVE 10/4, legal guardian req transfer to Hospice home.   Recommendations/Plan:  Full Comfort Care, Hospice home.   Goals of Care and Additional Recommendations:  Limitations on Scope of Treatment: Full Comfort Care  Code Status:    Code Status Orders        Start     Ordered   07/31/17 1531  Do not attempt resuscitation (DNR)  Continuous    Question Answer Comment  In the event of cardiac or respiratory ARREST Do not call a "code blue"   In the event of cardiac or respiratory ARREST Do not perform Intubation, CPR, defibrillation or ACLS   In the event of cardiac or respiratory ARREST Use medication by any route, position, wound care, and other measures to relive pain and suffering. May use oxygen, suction and manual treatment of airway obstruction as needed for comfort.      07/31/17 1530    Code Status History    Date Active Date Inactive Code Status Order ID Comments User Context   07/30/2017  4:23 PM 07/31/2017  3:30 PM Full Code [id number]  Deneise Lever, MD ED   01/05/2015  7:01 PM 01/06/2015  4:08 PM Full Code [id number]  Katheren Shams, DO Inpatient   09/01/2013  6:17 PM 09/02/2013  7:31 PM Full Code [id number]  [name], MD Inpatient   08/29/2013  9:33 PM 09/01/2013  6:17 PM Full Code [id number]  Modena Jansky, MD Inpatient       Prognosis:   Hours - Days, [name] is unresponsive, unable to take food or liquid.   Discharge Planning:  Hospice facility  Care plan was discussed with nursing staff, CM, SW, Lowella Fairy, and Dr. Roderic Palau.   Thank you for allowing the Palliative Medicine Team to assist in the  care of this patient.   Time In: 1325 Time Out: 1400 Total Time 35 minutes Prolonged Time Billed  no       Greater than 50%  of this time was spent counseling and coordinating care related to the above assessment and plan.  Drue Novel, NP  Please contact Palliative Medicine Team phone at [phone] for questions and concerns.

## 2017-08-02 NOTE — Progress Notes (Signed)
PROGRESS NOTE  [name]  XBM:[id number]  DOB: Jun 18, 1928  DOA: 07/30/2017 PCP: Chipper Herb, MD  Brief Admission Hx: 83 y/o female who presented from the emergency department with encephalopathy and severe agitation.   MDM/Assessment & Plan:   1. Acute metabolic encephalopathy - continues to be agitated and does not answer questions/follow commands. Neurology consulted and feels that her encephalopathy is likely related to acute illness including dehydration, congestive heart failure and dyspnea. She has had progressive decline in recent months. Palliative care also consulted and met with patient's guardian. Patient continued to be unresponsive and is not eating and drinking. Cardiac has requested inpatient hospice and transition to comfort measures.  2. Small SAH - hold aspirin, hold heparin.  Felt to be related to trauma. No further imaging per neurology.   3. Acute exacerbation of chronic diastolic CHF - appreciate assistance of cardiology team, although she was noted to have a mild degree of pulmonary vascular congestion, it was felt that she may be intravascularly dry. She has severe mitral and moderate aortic and tricuspid regurgitation. Due to her frailty, age and comorbidities, she was not felt to be an ideal operative candidate and would be at risk for diastolic decompensation. It was recommended that she be gently hydrated to see this helps her altered mental status. Should her breathing worsen, can consider IV diuresis.    4. Acute respiratory failure - CTA ruled out PE but there was a medium sized left pleural effusion. We'll continue with symptom management   5. Hypokalemia - replaced. Magnesium normal 6. Hypothyroidism - mildly elevated TSH, but normal Free T4.   DVT prophylaxis: SCDs Code Status: DNR Family Communication: guardian called Disposition Plan: guardian is requesting transition residential hospice  Consultants:  Palliative medicine  Cardiology     neurology  Subjective: Unresponsive to voice. Still moaning  Objective: Vitals:   08/01/17 2005 08/01/17 2135 08/02/17 0500 08/02/17 1300  BP:  (!) 132/94 133/71 128/73  Pulse:  (!) 108 (!) 110 (!) 102  Resp:  15 (!) 26 (!) 23  Temp:  99.3 F (37.4 C) 98.8 F (37.1 C) 99.9 F (37.7 C)  TempSrc:  Axillary Axillary Axillary  SpO2: (!) 85% 96% 98% 92%  Weight:      Height:        Intake/Output Summary (Last 24 hours) at 08/02/17 1414 Last data filed at 08/02/17 1300  Gross per 24 hour  Intake                0 ml  Output                0 ml  Net                0 ml   Filed Weights   07/30/17 1247 07/30/17 1835  Weight: 49.9 kg (110 lb) 49 kg (108 lb)     Exam:  General exam: thin, agitated female, NAD.  Respiratory system: Clear. No increased work of breathing. Cardiovascular system: S1 & S2 heard.   Gastrointestinal system: Abdomen is nondistended, soft and nontender. Normal bowel sounds heard. Central nervous system: agitated, does not open eyes to voice.  No focal neurological deficits. Extremities: no CCE.  Data Reviewed: Basic Metabolic Panel:  Recent Labs Lab 07/30/17 1306 07/31/17 0418 07/31/17 2248 08/01/17 0608 08/02/17 0447  NA 134* 134* 134* 135 138  K 3.3* 2.9* 3.7 4.0 4.1  CL 100* 104 104 108 108  CO2 23 20* 21* 20* 18*  GLUCOSE 107* 84 109* 118* 128*  BUN 27* 23* 21* 19 22*  CREATININE 0.81 0.69 0.72 0.70 0.76  CALCIUM 8.8* 8.4* 8.6* 8.5* 8.9  MG  --  2.0  --  2.0 2.1   Liver Function Tests:  Recent Labs Lab 07/30/17 1306 07/31/17 0418 08/01/17 0608 08/02/17 0447  AST 23 22 19 28   ALT 13* 14 13* 16  ALKPHOS 92 89 86 91  BILITOT 1.1 1.0 1.1 1.4*  PROT 5.3* 5.1* 4.8* 5.4*  ALBUMIN 2.7* 2.5* 2.4* 2.6*    Recent Labs Lab 07/30/17 1306  LIPASE 28    Recent Labs Lab 07/30/17 1618  AMMONIA 23   CBC:  Recent Labs Lab 07/30/17 1306 07/31/17 0418 08/01/17 0608 08/02/17 0447  WBC 9.8 8.5 10.1 13.9*  NEUTROABS 8.0*  --   8.7* 12.3*  HGB 9.1* 8.8* 9.1* 9.9*  HCT 27.5* 27.9* 27.5* 30.7*  MCV 95.8 98.6 95.8 97.5  PLT 220 216 222 271   Cardiac Enzymes:  Recent Labs Lab 07/30/17 1358 07/30/17 2220 07/31/17 0418 07/31/17 1026  TROPONINI 0.40* 0.32* 0.28* 0.31*   CBG (last 3)  No results for input(s): GLUCAP in the last 72 hours. Recent Results (from the past 240 hour(s))  Urine Culture     Status: Abnormal   Collection Time: 07/30/17 12:51 PM  Result Value Ref Range Status   Specimen Description URINE, CATHETERIZED  Final   Special Requests NONE  Final   Culture 60,000 COLONIES/mL LACTOBACILLUS SPECIES (A)  Final   Report Status 08/02/2017 FINAL  Final  MRSA PCR Screening     Status: None   Collection Time: 07/30/17  7:02 PM  Result Value Ref Range Status   MRSA by PCR NEGATIVE NEGATIVE Final    Comment:        The GeneXpert MRSA Assay (FDA approved for NASAL specimens only), is one component of a comprehensive MRSA colonization surveillance program. It is not intended to diagnose MRSA infection nor to guide or monitor treatment for MRSA infections.      Studies: Ct Head Wo Contrast  Result Date: 07/31/2017 CLINICAL DATA:  Altered mental status. Follow-up intracranial hemorrhage. EXAM: CT HEAD WITHOUT CONTRAST TECHNIQUE: Contiguous axial images were obtained from the base of the skull through the vertex without intravenous contrast. COMPARISON:  Head CT 07/30/2017 FINDINGS: Brain: 2 small foci of subarachnoid hemorrhage at the superior bilateral convexities are unchanged. No new area of hemorrhage. No edema, midline shift or mass effect. No hydrocephalus. Brain parenchyma and CSF-containing spaces are normal for age. Vascular: No hyperdense vessel or unexpected calcification. Skull: Normal visualized skull base, calvarium and extracranial soft tissues. Sinuses/Orbits: No sinus fluid levels or advanced mucosal thickening. No mastoid effusion. Normal orbits. IMPRESSION: Unchanged examination  with 2 small foci of subarachnoid hemorrhage along both superior frontal convexities. No new hemorrhage. No mass effect. Electronically Signed   By: Ulyses Jarred M.D.   On: 07/31/2017 19:42   Scheduled Meds: . morphine CONCENTRATE  2.6 mg Oral Q4H  . sertraline  100 mg Oral Daily   Continuous Infusions:   Active Problems:   Hypothyroidism   Encephalopathy   Acute diastolic CHF (congestive heart failure) ([facility])   Acute respiratory failure ([facility])   Elevated troponin   Congestive heart failure ([place])   Delirium due to another medical condition   Palliative care encounter   Goals of care, counseling/discussion   Protein-calorie malnutrition, severe  Time spent: 3mns  Poppy Mcafee, MD [facility] Pager [phone] If  7PM-7AM, please contact night-coverage [email or web address] Password [email or web address] 08/02/2017, 2:14 PM    LOS: 3 days

## 2017-08-02 NOTE — Discharge Summary (Signed)
Physician Discharge Summary  Kirby Argueta [facility] WLS:[id number] DOB: October 09, 1928 DOA: 07/30/2017  PCP: Chipper Herb, MD  Admit date: 07/30/2017 Discharge date: 08/02/2017  Admitted From: home Disposition:  home  Recommendations for Outpatient Follow-up:  1. Discharge to residential hospice for end of life care  Discharge Condition: hospice CODE STATUS: DNR, comfort measures Diet recommendation: regular diet for comfort  Brief/Interim Summary: 83 y/o female who presented from the emergency department with encephalopathy and severe agitation.  Discharge Diagnoses:  Active Problems:   Hypothyroidism   Encephalopathy   Acute diastolic CHF (congestive heart failure) (HCC)   Acute respiratory failure (HCC)   Elevated troponin   Congestive heart failure ([place])   Delirium due to another medical condition   Palliative care encounter   Goals of care, counseling/discussion   Protein-calorie malnutrition, severe  1. Acute metabolic encephalopathy - continues to be agitated and does not answer questions/follow commands. Neurology consulted and feels that her encephalopathy is likely related to acute illness including dehydration, congestive heart failure and dyspnea. She has had progressive decline in recent months. Palliative care also consulted and met with patient's guardian. Patient continued to be unresponsive and is not eating and drinking. She's not had any meaningful improvement in the last few days. Patient's guardian has requested inpatient hospice and transition to comfort measures.  2. Subarachnoid hemorrhage- hold aspirin, hold heparin.  Felt to be related to trauma. No further imaging per neurology.   3. Acute exacerbation of chronic diastolic CHF - appreciate assistance of cardiology team, although she was noted to have a mild degree of pulmonary vascular congestion, it was felt that she may be intravascularly dry. She has severe mitral and moderate aortic and tricuspid regurgitation.  Due to her frailty, age and comorbidities, she was not felt to be an ideal operative candidate and would be at risk for diastolic decompensation. It was recommended that she be gently hydrated to see this helps her altered mental status. Unfortunately, this has not helped her mentation.    4. Acute respiratory failure - CTA ruled out PE but there was a medium sized left pleural effusion. We'll continue with symptom management   5. Hypokalemia - replaced. Magnesium normal 6. Hypothyroidism - mildly elevated TSH, but normal Free T4.   Discharge Instructions  Discharge Instructions    Diet - low sodium heart healthy    Complete by:  As directed    Increase activity slowly    Complete by:  As directed      Allergies as of 08/02/2017   No Known Allergies     Medication List    STOP taking these medications   diclofenac 75 MG EC tablet Commonly known as:  VOLTAREN   levothyroxine 88 MCG tablet Commonly known as:  SYNTHROID, LEVOTHROID   meloxicam 15 MG tablet Commonly known as:  MOBIC   multivitamin with minerals Tabs tablet   oxyCODONE 5 MG immediate release tablet Commonly known as:  Oxy IR/ROXICODONE   sodium chloride 0.65 % Soln nasal spray Commonly known as:  OCEAN     TAKE these medications   bisacodyl 10 MG suppository Commonly known as:  DULCOLAX Place 1 suppository (10 mg total) rectally daily as needed for moderate constipation (please check rectal vault).   lip balm Oint Apply 1 application topically as needed for lip care.   LORazepam 0.5 MG tablet Commonly known as:  ATIVAN Take 1 tablet (0.5 mg total) by mouth 2 (two) times daily.   morphine CONCENTRATE 10 MG/0.5ML  Soln concentrated solution Take 0.13 mLs (2.6 mg total) by mouth every 4 (four) hours.   morphine CONCENTRATE 10 MG/0.5ML Soln concentrated solution Take 0.13 mLs (2.6 mg total) by mouth every hour as needed for severe pain (increased RR rate).   sertraline 100 MG tablet Commonly known as:   ZOLOFT Take 100 mg by mouth daily.       No Known Allergies  Consultations:  Palliative care  Neurology  cardiology   Procedures/Studies: Ct Head Wo Contrast  Result Date: 07/31/2017 CLINICAL DATA:  Altered mental status. Follow-up intracranial hemorrhage. EXAM: CT HEAD WITHOUT CONTRAST TECHNIQUE: Contiguous axial images were obtained from the base of the skull through the vertex without intravenous contrast. COMPARISON:  Head CT 07/30/2017 FINDINGS: Brain: 2 small foci of subarachnoid hemorrhage at the superior bilateral convexities are unchanged. No new area of hemorrhage. No edema, midline shift or mass effect. No hydrocephalus. Brain parenchyma and CSF-containing spaces are normal for age. Vascular: No hyperdense vessel or unexpected calcification. Skull: Normal visualized skull base, calvarium and extracranial soft tissues. Sinuses/Orbits: No sinus fluid levels or advanced mucosal thickening. No mastoid effusion. Normal orbits. IMPRESSION: Unchanged examination with 2 small foci of subarachnoid hemorrhage along both superior frontal convexities. No new hemorrhage. No mass effect. Electronically Signed   By: Ulyses Jarred M.D.   On: 07/31/2017 19:42   Ct Head Wo Contrast  Result Date: 07/30/2017 CLINICAL DATA:  Altered mental status EXAM: CT HEAD WITHOUT CONTRAST TECHNIQUE: Contiguous axial images were obtained from the base of the skull through the vertex without intravenous contrast. COMPARISON:  Head CT 06/25/2017 FINDINGS: Brain: There is a small focus of subarachnoid hemorrhage along the right frontal convexity measuring approximately 6 mm. Second focus of suspected subarachnoid hemorrhages seen at the superior left convexity. Old right basal ganglia lacunar infarct. No midline shift or other mass effect. There is periventricular hypoattenuation compatible with chronic microvascular disease. Vascular: No hyperdense vessel or unexpected calcification. Skull: Normal visualized skull  base, calvarium and extracranial soft tissues. Sinuses/Orbits: Paranasal sinuses are clear. Partial opacification of the right middle ear. Normal orbits. IMPRESSION: 1. Small foci of subarachnoid hemorrhage along both superior frontal convexities. In a patient of this age, this may be secondary to underlying cerebral amyloid angiopathy. 2. No mass effect or hydrocephalus. 3. 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 Critical Value/emergent results were called by telephone at the time of interpretation on 07/30/2017 at 5:59 pm to Dr. Jeneen Rinks , who verbally acknowledged these results. Electronically Signed   By: Ulyses Jarred M.D.   On: 07/30/2017 17:58   Ct Angio Chest Pe W Or Wo Contrast  Result Date: 07/30/2017 CLINICAL DATA:  Dyspnea.  Altered mental status. EXAM: CT ANGIOGRAPHY CHEST WITH CONTRAST TECHNIQUE: Multidetector CT imaging of the chest was performed using the standard protocol during bolus administration of intravenous contrast. Multiplanar CT image reconstructions and MIPs were obtained to evaluate the vascular anatomy. CONTRAST:  100 mL Isovue 370 COMPARISON:  Chest radiograph 07/30/2017 FINDINGS: Cardiovascular: Contrast injection is sufficient to demonstrate satisfactory opacification of the pulmonary arteries to the segmental level. There is no pulmonary embolus. The main pulmonary artery is within normal limits for size. There is no CT evidence of acute right heart strain. There is calcific aortic atherosclerosis. Heart size is enlarged. No pericardial effusion. Mediastinum/Nodes: No mediastinal, hilar or axillary lymphadenopathy. The visualized thyroid and thoracic esophageal course are unremarkable. Lungs/Pleura: Medium left pleural effusion. Left basilar atelectasis. Dependent subsegmental atelectasis in the right lung. No large consolidation.  Upper Abdomen: Contrast bolus timing is not optimized for evaluation of the abdominal organs. Within this limitation, the visualized  organs of the upper abdomen are normal. Musculoskeletal: No chest wall abnormality. No acute or significant osseous findings. Review of the MIP images confirms the above findings. IMPRESSION: 1. No pulmonary embolus. 2. Medium-sized left pleural effusion with associated atelectasis. 3. Cardiomegaly and Aortic Atherosclerosis (ICD10-I70.0). Electronically Signed   By: Ulyses Jarred M.D.   On: 07/30/2017 17:47   Dg Chest Port 1 View  Result Date: 07/30/2017 CLINICAL DATA:  Patient with altered mental status. Shortness of breath. EXAM: PORTABLE CHEST 1 VIEW COMPARISON:  Chest radiograph 06/25/2017. FINDINGS: Multi lead pacer apparatus overlies the left hemithorax. Monitoring leads overlie the patient. Stable cardiomegaly. Aortic atherosclerosis. Pulmonary vascular redistribution. Bilateral interstitial pulmonary opacities. Small left effusion. No pneumothorax. IMPRESSION: Pulmonary vascular redistribution and findings suggestive of edema with small left effusion. Electronically Signed   By: Lovey Newcomer M.D.   On: 07/30/2017 13:18       Subjective: Does not respond to voice  Discharge Exam: Vitals:   08/02/17 0500 08/02/17 1300  BP: 133/71 128/73  Pulse: (!) 110 (!) 102  Resp: (!) 26 (!) 23  Temp: 98.8 F (37.1 C) 99.9 F (37.7 C)  SpO2: 98% 92%   Vitals:   08/01/17 2005 08/01/17 2135 08/02/17 0500 08/02/17 1300  BP:  (!) 132/94 133/71 128/73  Pulse:  (!) 108 (!) 110 (!) 102  Resp:  15 (!) 26 (!) 23  Temp:  99.3 F (37.4 C) 98.8 F (37.1 C) 99.9 F (37.7 C)  TempSrc:  Axillary Axillary Axillary  SpO2: (!) 85% 96% 98% 92%  Weight:      Height:        General: Pt unresponsive, moaning Cardiovascular: RRR, S1/S2 +, no rubs, no gallops Respiratory: CTA bilaterally, no wheezing, no rhonchi Abdominal: Soft, NT, ND, bowel sounds + Extremities: no edema, no cyanosis    The results of significant diagnostics from this hospitalization (including imaging, microbiology, ancillary and  laboratory) are listed below for reference.     Microbiology: Recent Results (from the past 240 hour(s))  Urine Culture     Status: Abnormal   Collection Time: 07/30/17 12:51 PM  Result Value Ref Range Status   Specimen Description URINE, CATHETERIZED  Final   Special Requests NONE  Final   Culture 60,000 COLONIES/mL LACTOBACILLUS SPECIES (A)  Final   Report Status 08/02/2017 FINAL  Final  MRSA PCR Screening     Status: None   Collection Time: 07/30/17  7:02 PM  Result Value Ref Range Status   MRSA by PCR NEGATIVE NEGATIVE Final    Comment:        The GeneXpert MRSA Assay (FDA approved for NASAL specimens only), is one component of a comprehensive MRSA colonization surveillance program. It is not intended to diagnose MRSA infection nor to guide or monitor treatment for MRSA infections.      Labs: BNP (last 3 results)  Recent Labs  07/30/17 1358  BNP 2,202.5*   Basic Metabolic Panel:  Recent Labs Lab 07/30/17 1306 07/31/17 0418 07/31/17 2248 08/01/17 0608 08/02/17 0447  NA 134* 134* 134* 135 138  K 3.3* 2.9* 3.7 4.0 4.1  CL 100* 104 104 108 108  CO2 23 20* 21* 20* 18*  GLUCOSE 107* 84 109* 118* 128*  BUN 27* 23* 21* 19 22*  CREATININE 0.81 0.69 0.72 0.70 0.76  CALCIUM 8.8* 8.4* 8.6* 8.5* 8.9  MG  --  2.0  --  2.0 2.1   Liver Function Tests:  Recent Labs Lab 07/30/17 1306 07/31/17 0418 08/01/17 0608 08/02/17 0447  AST 23 22 19 28   ALT 13* 14 13* 16  ALKPHOS 92 89 86 91  BILITOT 1.1 1.0 1.1 1.4*  PROT 5.3* 5.1* 4.8* 5.4*  ALBUMIN 2.7* 2.5* 2.4* 2.6*    Recent Labs Lab 07/30/17 1306  LIPASE 28    Recent Labs Lab 07/30/17 1618  AMMONIA 23   CBC:  Recent Labs Lab 07/30/17 1306 07/31/17 0418 08/01/17 0608 08/02/17 0447  WBC 9.8 8.5 10.1 13.9*  NEUTROABS 8.0*  --  8.7* 12.3*  HGB 9.1* 8.8* 9.1* 9.9*  HCT 27.5* 27.9* 27.5* 30.7*  MCV 95.8 98.6 95.8 97.5  PLT 220 216 222 271   Cardiac Enzymes:  Recent Labs Lab 07/30/17 1358  07/30/17 2220 07/31/17 0418 07/31/17 1026  TROPONINI 0.40* 0.32* 0.28* 0.31*   BNP: Invalid input(s): POCBNP CBG: No results for input(s): GLUCAP in the last 168 hours. D-Dimer No results for input(s): DDIMER in the last 72 hours. Hgb A1c No results for input(s): HGBA1C in the last 72 hours. Lipid Profile No results for input(s): CHOL, HDL, LDLCALC, TRIG, CHOLHDL, LDLDIRECT in the last 72 hours. Thyroid function studies  Recent Labs  07/30/17 1726  T3FREE 1.0*   Anemia work up  Recent Labs  07/31/17 1026  VITAMINB12 1,019*   Urinalysis    Component Value Date/Time   COLORURINE AMBER (A) 07/30/2017 1251   APPEARANCEUR HAZY (A) 07/30/2017 1251   APPEARANCEUR Clear 11/10/2016 1511   LABSPEC 1.023 07/30/2017 1251   PHURINE 5.0 07/30/2017 1251   GLUCOSEU NEGATIVE 07/30/2017 1251   HGBUR LARGE (A) 07/30/2017 1251   BILIRUBINUR SMALL (A) 07/30/2017 1251   BILIRUBINUR Negative 11/10/2016 1511   KETONESUR 20 (A) 07/30/2017 1251   PROTEINUR 30 (A) 07/30/2017 1251   UROBILINOGEN 0.2 01/05/2015 1615   NITRITE NEGATIVE 07/30/2017 1251   LEUKOCYTESUR NEGATIVE 07/30/2017 1251   LEUKOCYTESUR 2+ (A) 11/10/2016 1511   Sepsis Labs Invalid input(s): PROCALCITONIN,  WBC,  LACTICIDVEN Microbiology Recent Results (from the past 240 hour(s))  Urine Culture     Status: Abnormal   Collection Time: 07/30/17 12:51 PM  Result Value Ref Range Status   Specimen Description URINE, CATHETERIZED  Final   Special Requests NONE  Final   Culture 60,000 COLONIES/mL LACTOBACILLUS SPECIES (A)  Final   Report Status 08/02/2017 FINAL  Final  MRSA PCR Screening     Status: None   Collection Time: 07/30/17  7:02 PM  Result Value Ref Range Status   MRSA by PCR NEGATIVE NEGATIVE Final    Comment:        The GeneXpert MRSA Assay (FDA approved for NASAL specimens only), is one component of a comprehensive MRSA colonization surveillance program. It is not intended to diagnose MRSA infection nor  to guide or monitor treatment for MRSA infections.      Time coordinating discharge: Over 30 minutes  SIGNED:   Kathie Dike, MD  [facility] 08/02/2017, 5:14 PM Pager   If 7PM-7AM, please contact night-coverage [email or web address] Password [email or web address]

## 2017-08-02 NOTE — Progress Notes (Signed)
Patient is to be discharged to hospice home in stable condition. Patient's IV and telemetry removed, WNL. Patient legal guardian aware of move. Report given to Butch Penny, RN at [facility]. Patient will be transported by EMS to facility.  Celestia Khat, RN

## 2017-08-02 NOTE — Clinical Social Work Note (Signed)
Patient Information   Patient Name [name], [name] ([id number]) Sex Female DOB 05-09-1928 SSN [id number]  Room Bed  A331 A331-01  Patient Demographics   Address [address] [place] [address] Phone [phone] (Home) [phone] (Mobile) *Preferred*  Patient Ethnicity & Race   Ethnic Group Patient Race  Not Hispanic or Latino White or Caucasian  Emergency Contact(s)   Name Relation Home Work Mobile  [place]  [phone]   Deveron Furlong Niece [phone]    Rosalyn Gess Niece [phone]    Documents on File    Status Date Received Description  Documents for the Patient  [place] E-Signature HIPAA Notice of Privacy Spanish Not Received    Insurance Card Received 02/04/13 MEDICARE/COVENTRY  Advance Directives/Living Will/HCPOA/POA Not Received    Release of Information Not Received    Insurance Card Received 05/28/13 [employer] Card Not Received    Insurance Card Received 11/10/13 [place] Card Received 04/16/13 [facility] [employer] Card Not Received    Insurance Card Not Received    Driver's License Received 36/14/43 BAD COPY  [place] HIPAA NOTICE OF PRIVACY - Scanned Not Received    Insurance Card Not Received    Advanced Beneficiary Notice (ABN) Not Received    Driver's License Received 15/40/08 BAD COPY  Insurance Card Received 07/28/13   Insurance Card Received 07/28/13 WRFM/COVENTRY  Insurance Card Received 11/26/13 MEDICARE  Release of Information Received 09/04/13 WRFM/.JB  HIM ROI Authorization Not Received    Release of Information Not Received    Insurance Card Not Received    [employer] HIPAA Notice of Privacy - E Signature Signed 11/26/13 CCS  Other Not Received    Insurance Card Received 01/26/14 WRFM/MCR/[employer] [employer]ER  Driver's License Not Received    [employer] Notice of Privacy - Scanned Received 11/26/13 CCS FP  [place] HIPAA NOTICE OF PRIVACY  - Scanned Received 11/26/13 CCS [place] E-Signature HIPAA Notice of Privacy Signed 07/30/17   Insurance Card Received 04/17/14 BCG INS & DL  E-Signature AOB Spanish Not Received    Insurance Card Received 11/26/14 11/11/14  HIM ROI Authorization (Expired) 12/28/14   Release of Information  12/30/14   Other Photo ID Not Received    Insurance Card Received 12/15/16 MCR/RURAL C/LW/CHMGHT  Release of Information   DPR [facility] 12/19/16  HIM ROI Authorization  05/10/17 [facility]HS  HIM ROI Authorization  05/31/17   AMB Outside Hospital Record  05/10/17 D/S [facility] Record  05/10/17 D/S [facility]  Guardianship Documents  07/26/17   [place] HIPAA NOTICE OF PRIVACY - Scanned Not Received (Deleted)    [place] E-Signature HIPAA Notice of Privacy Not Received (Deleted)    Driver's License Received (Deleted)    Driver's License (Deleted) 67/61/95   Release of Information Received (Deleted) 07/28/13 [place] Card Received (Deleted) 07/31/13   [facility] HIPAA Notice of Privacy - Scanned Not Received (Deleted)    Documents for the Encounter  AOB (Assignment of Insurance Benefits) Not Received    E-signature AOB Signed 07/30/17   MEDICARE RIGHTS Not Received    E-signature Medicare Rights Signed 07/30/17   Financial Correspondence   2018 [employer] BENEFIT INFO  ED Patient Billing Extract   ED PB Summary  Cardiac Monitoring Strip Shift Summary  07/30/17   Admission Information   Attending Provider Admitting Provider Admission Type Admission Date/Time  Kathie Dike,  MD Deneise Lever, MD Emergency 07/30/17 1244  Discharge Date Hospital Service Auth/Cert Status Service Area   Internal Medicine Incomplete [place]  Unit Room/Bed Admission Status   AP-DEPT 300 A331/A331-01 Admission (Confirmed)   Admission   Complaint  [facility]   Name Acct ID Class Status Primary  Coverage  [name], [name] [id number] Inpatient Open MEDICARE - MEDICARE PART A AND B      Guarantor Account (for Hospital Account [id number])   Name Relation to Pt Service Area Active? Acct Type  [name] Self CHSA Yes Personal/Family  Address Phone    [address] [place], [place] [address] [phone])        Coverage Information (for Hospital Account [id number])   1. [place] PART A AND B   F/O Payor/Plan Precert #  MEDICARE/MEDICARE PART A AND B   Subscriber Subscriber #  [name], [name] [id number]  Address Phone  [address] [place], [place] [address]   2. AETNA/GENERIC AETNA   F/O Payor/Plan Precert #  AETNA/GENERIC AETNA   Subscriber Subscriber #  [name], [name] [id number]  [place] [address] [phone]

## 2017-08-02 NOTE — Clinical Social Work Note (Signed)
Per request of Palliative Care NP, LCSW made referral to [facility].     Nakia Remmers, Clydene Pugh, LCSW

## 2017-08-30 DEATH — deceased

## 2017-10-03 ENCOUNTER — Ambulatory Visit: Payer: Medicare Other | Admitting: Family Medicine

## 2017-12-19 IMAGING — CT CT HEAD W/O CM
3 series · 15 of 47 positions shown, 18 images · non-contrast
Comparison: Head CT 06/25/2017

CLINICAL DATA: Altered mental status

EXAM:
CT HEAD WITHOUT CONTRAST
TECHNIQUE: Contiguous axial images were obtained from the base of the skull
through the vertex without intravenous contrast.

[Series 2: head trauma wo · axial · 0.44mm/px · z∈[+251,+376]mm · 9 of 31 slices shown, 12 images]
[im 3/31  brain]
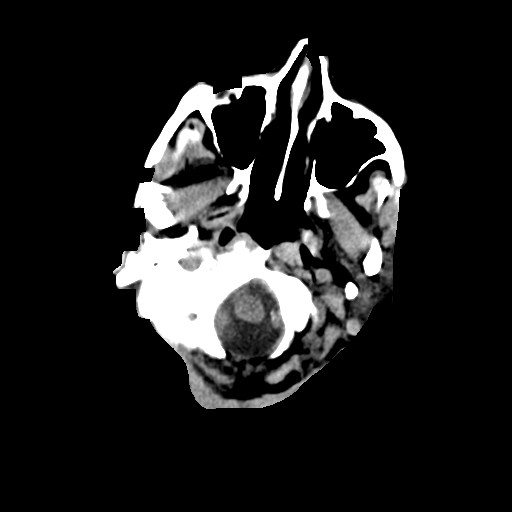
[im 3/31  bone]
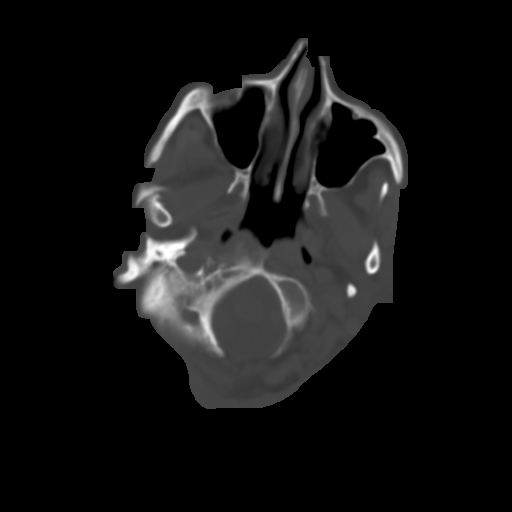
[im 6/31  brain]
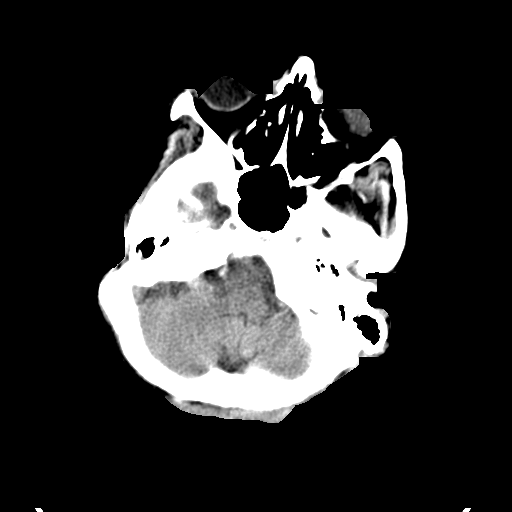
[im 9/31  brain]
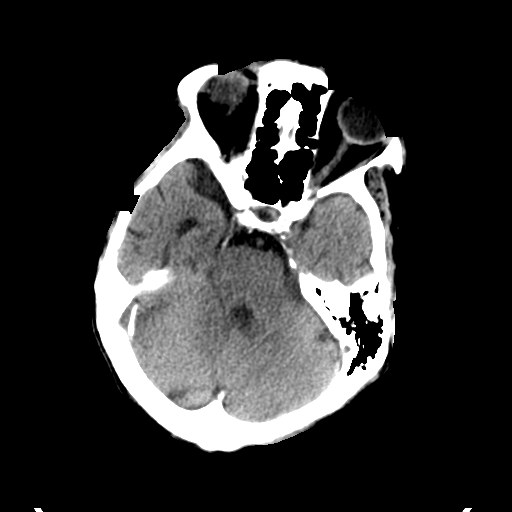
[im 12/31  brain]
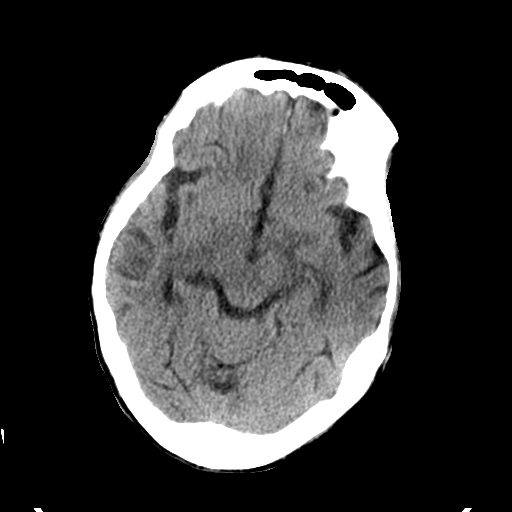
[im 16/31  brain]
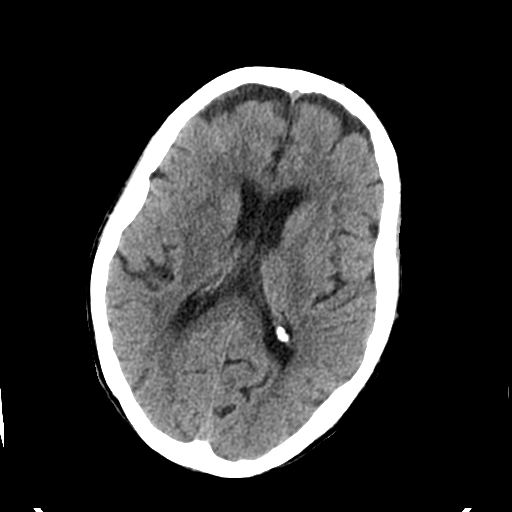
[im 16/31  bone]
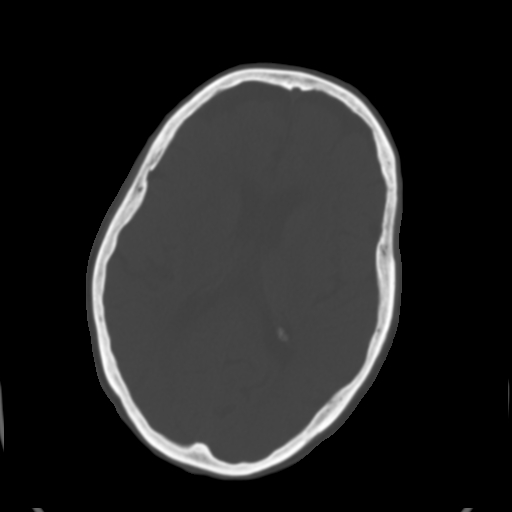
[im 19/31  brain]
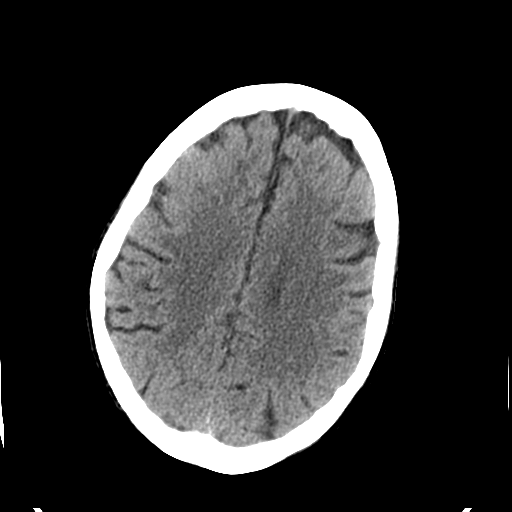
[im 22/31  brain]
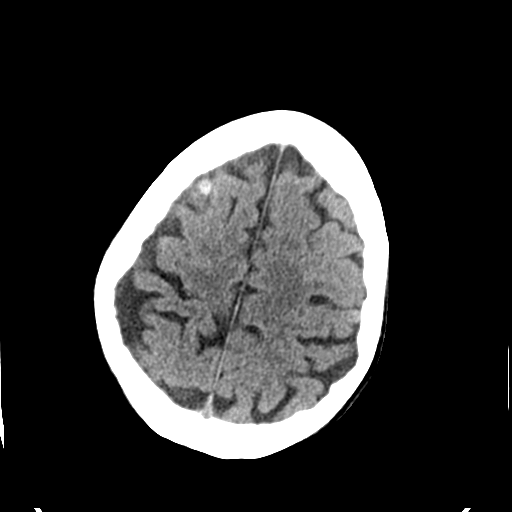
[im 25/31  brain]
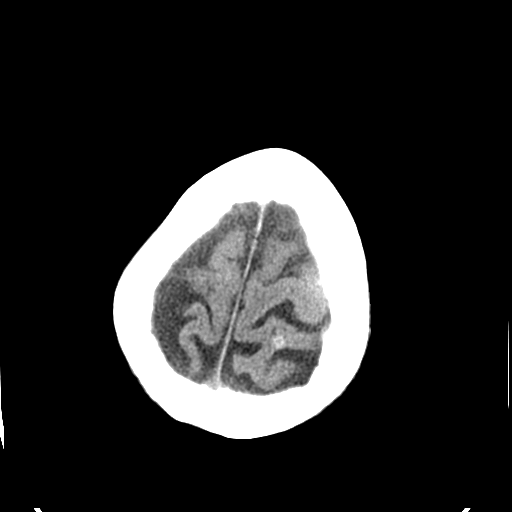
[im 28/31  brain]
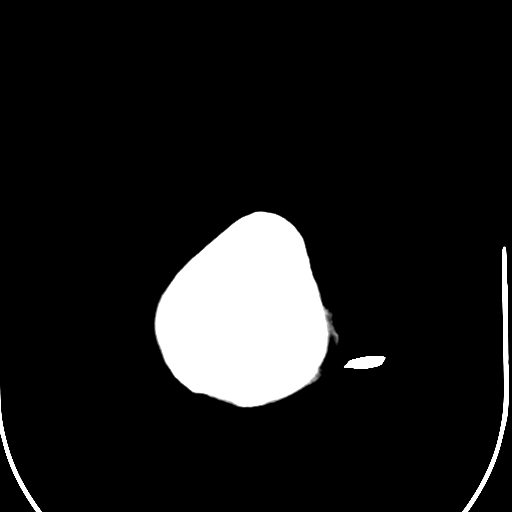
[im 28/31  bone]
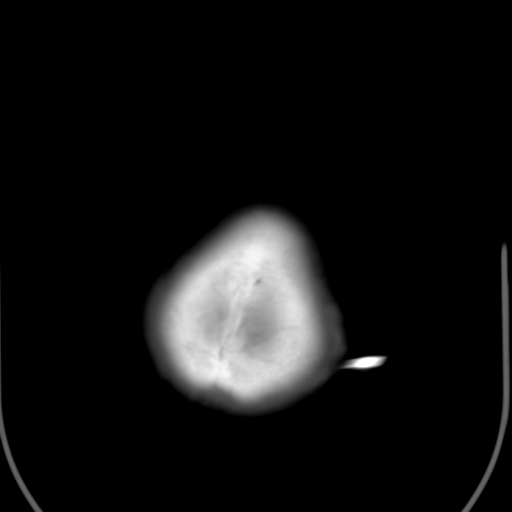

[Series 4: coronal soft tissue · coronal · 0.29mm/px · 3 of 68 slices shown]
[im 23/68  brain]
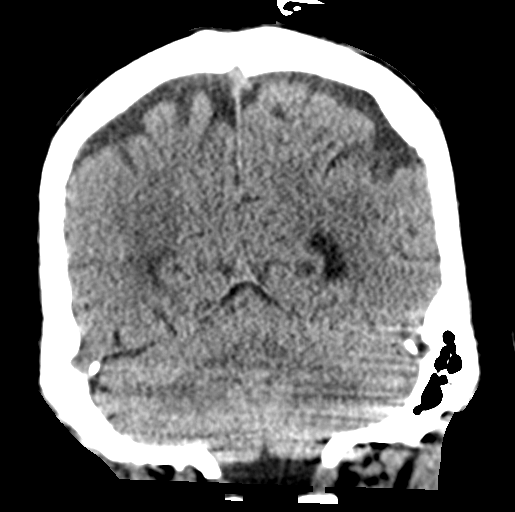
[im 30/68  brain]
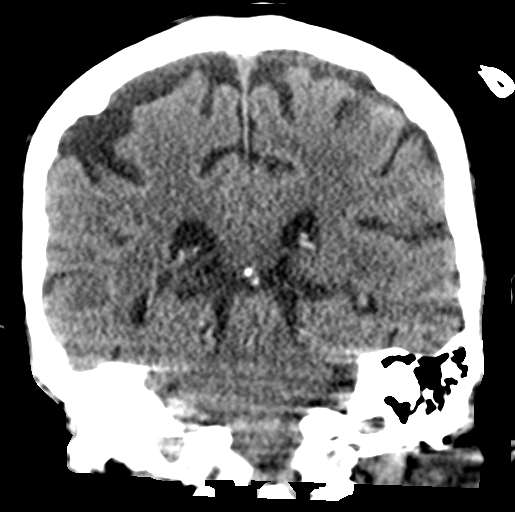
[im 38/68  brain]
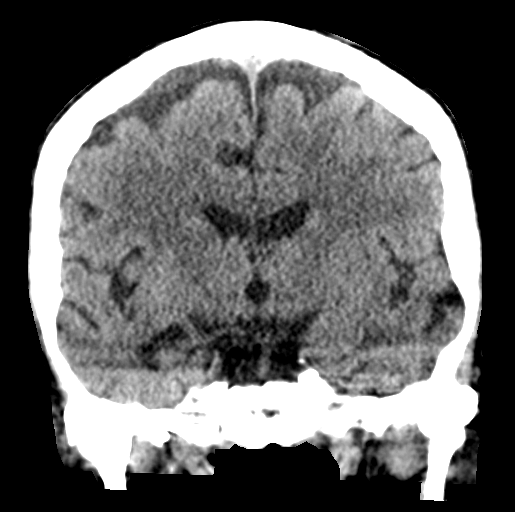

[Series 5: sagittal soft tissue · sagittal · 0.33mm/px · 3 of 51 slices shown]
[im 17/51  brain]
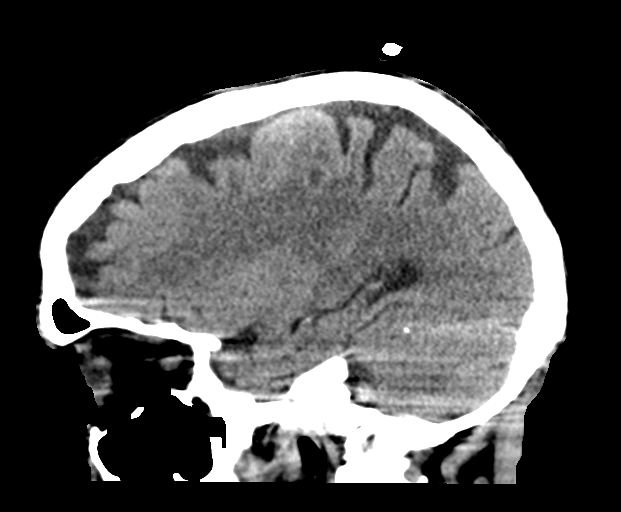
[im 26/51  brain]
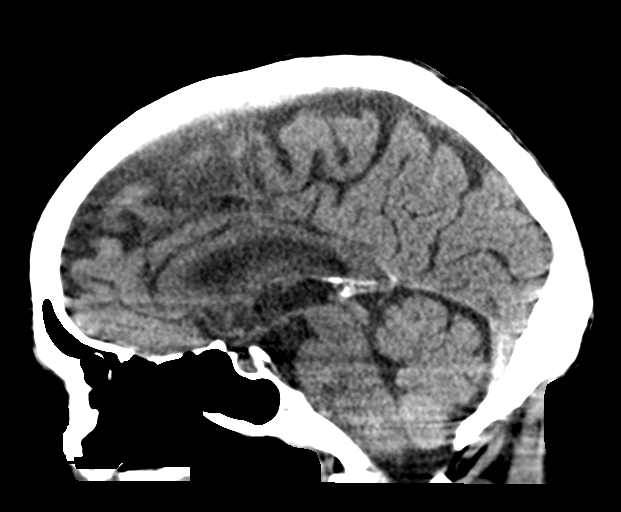
[im 34/51  brain]
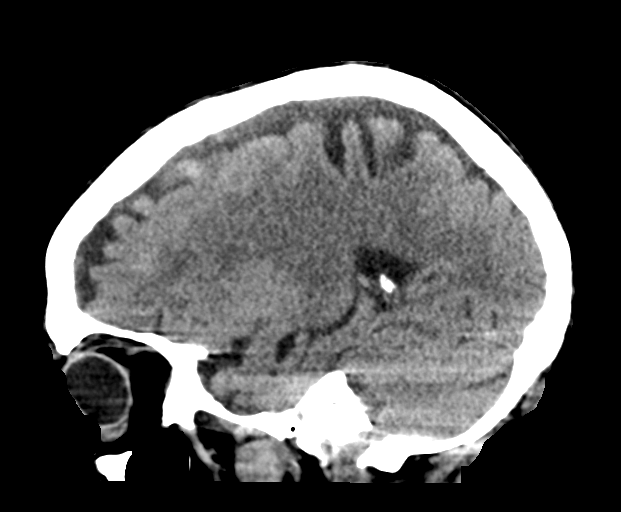

[15 of 47 positions shown; findings below may reference images not displayed]

FINDINGS: Brain: There is a small focus of subarachnoid hemorrhage along the
right frontal convexity measuring approximately 6 mm. Second focus
of suspected subarachnoid hemorrhages seen at the superior left
convexity. Old right basal ganglia lacunar infarct. No midline shift
or other mass effect. There is periventricular hypoattenuation
compatible with chronic microvascular disease.

Vascular: No hyperdense vessel or unexpected calcification.

Skull: Normal visualized skull base, calvarium and extracranial soft
tissues.

Sinuses/Orbits: Paranasal sinuses are clear. Partial opacification
of the right middle ear. Normal orbits.
IMPRESSION: 1. Small foci of subarachnoid hemorrhage along both superior frontal
convexities. In a patient of this age, this may be secondary to
underlying cerebral amyloid angiopathy.
2. No mass effect or hydrocephalus.
3. Chronic ischemic microangiopathy and old lacunar infarcts.
Critical Value/emergent results were called by telephone at the time
of interpretation on 07/30/2017 at [DATE] to Dr. MAYER , who
verbally acknowledged these results.

## 2017-12-19 IMAGING — CT CT ANGIO CHEST
2 of 6 series · 19 of 36 positions shown · IV contrast (Isovue)
Comparison: Chest radiograph 07/30/2017

CLINICAL DATA: Dyspnea.  Altered mental status.

EXAM:
CT ANGIOGRAPHY CHEST WITH CONTRAST
TECHNIQUE: Multidetector CT imaging of the chest was performed using the
standard protocol during bolus administration of intravenous
contrast. Multiplanar CT image reconstructions and MIPs were
obtained to evaluate the vascular anatomy.
CONTRAST:  100 mL Isovue 370

[Series 6: thins · axial · 0.56mm/px · z∈[-184,+104]mm · 18 of 320 slices shown]
[im 16/320  lung]
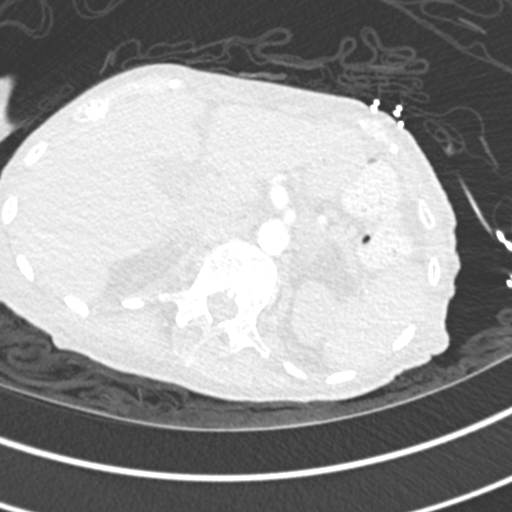
[im 32/320  mediastinal]
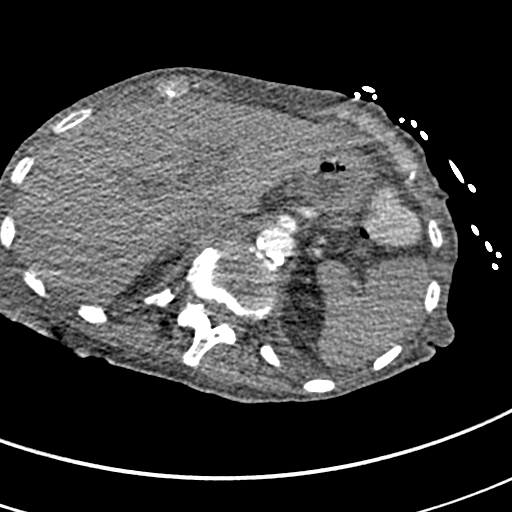
[im 48/320  lung]
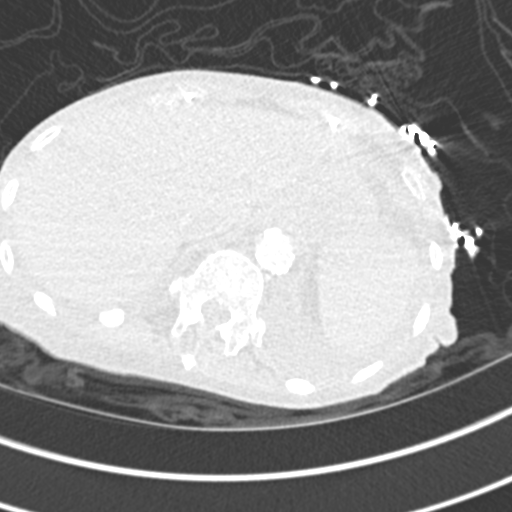
[im 64/320  mediastinal]
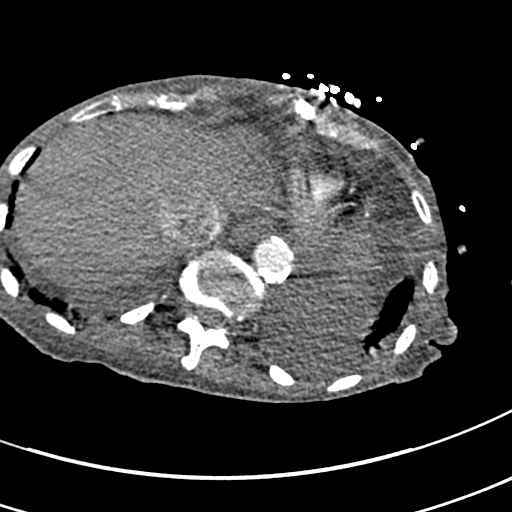
[im 80/320  lung]
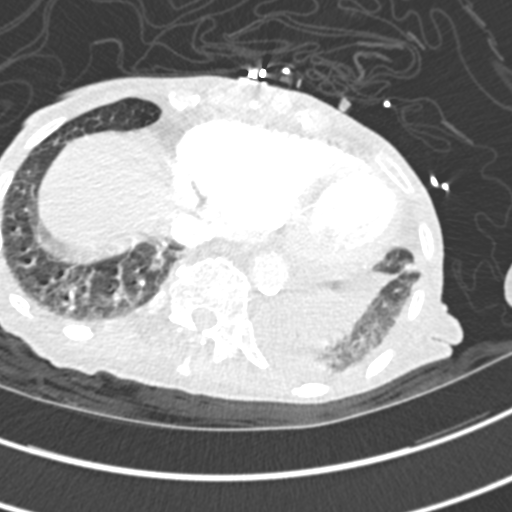
[im 96/320  mediastinal]
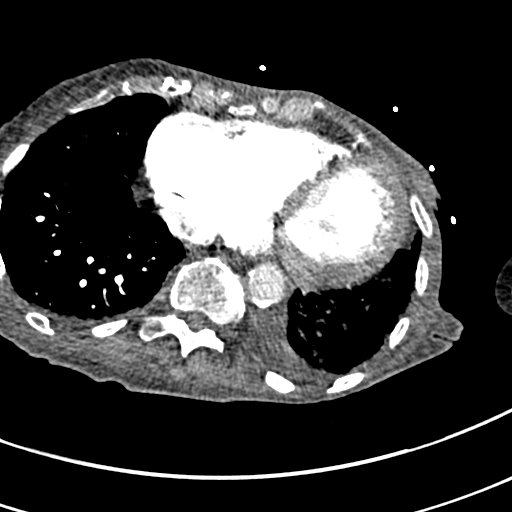
[im 112/320  lung]
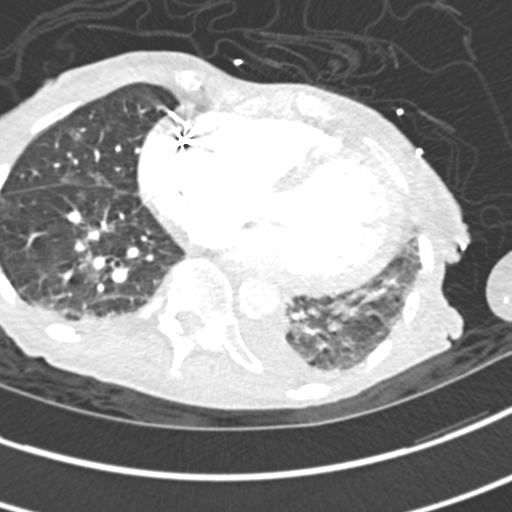
[im 128/320  mediastinal]
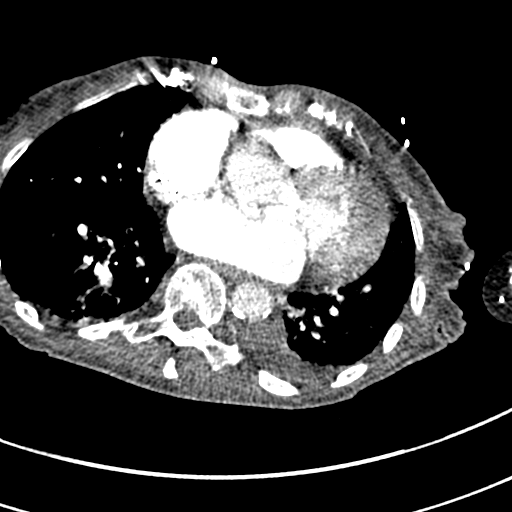
[im 144/320  lung]
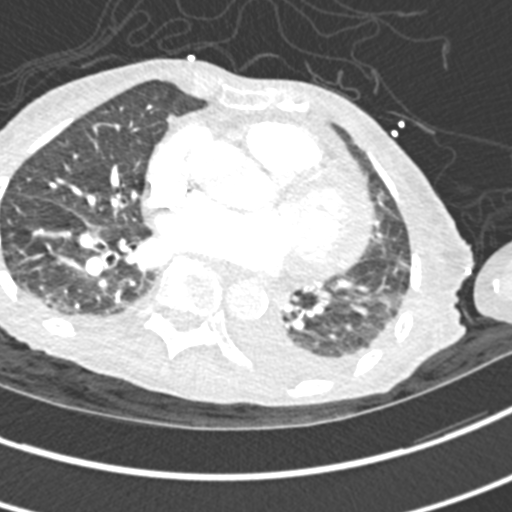
[im 176/320  mediastinal]
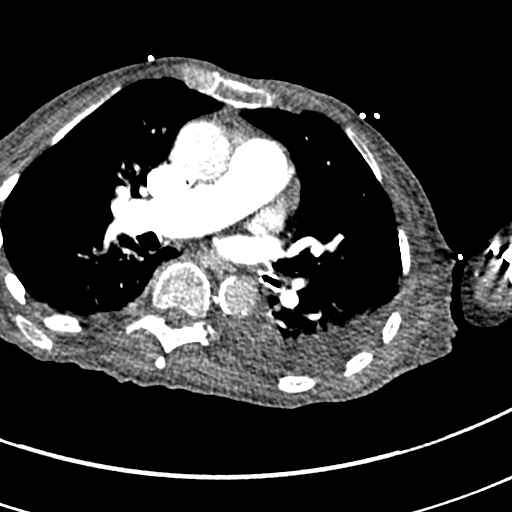
[im 192/320  lung]
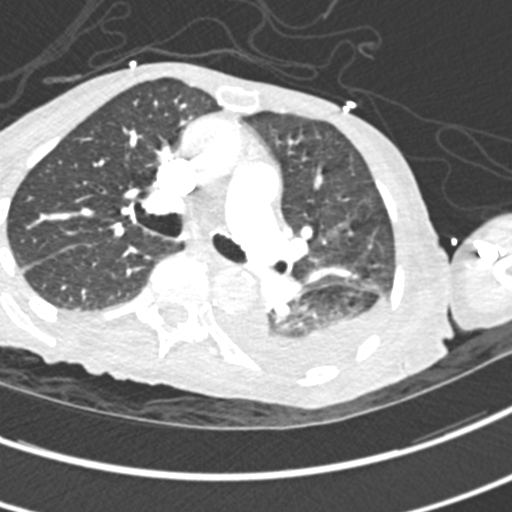
[im 208/320  mediastinal]
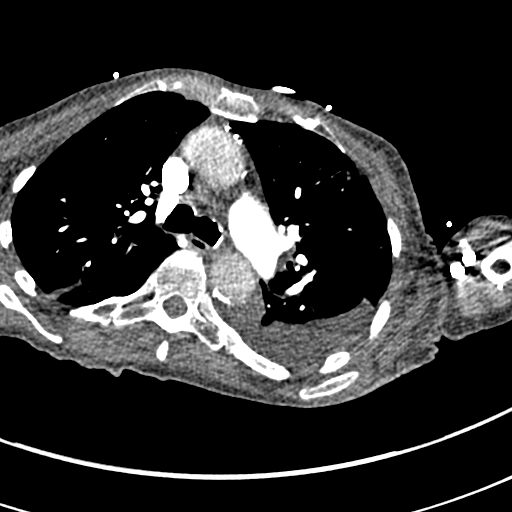
[im 224/320  lung]
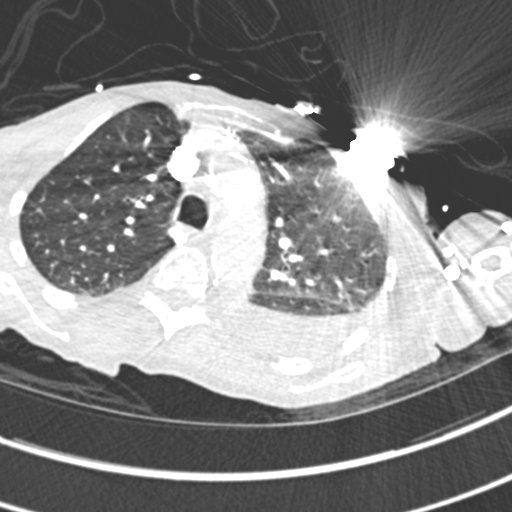
[im 240/320  mediastinal]
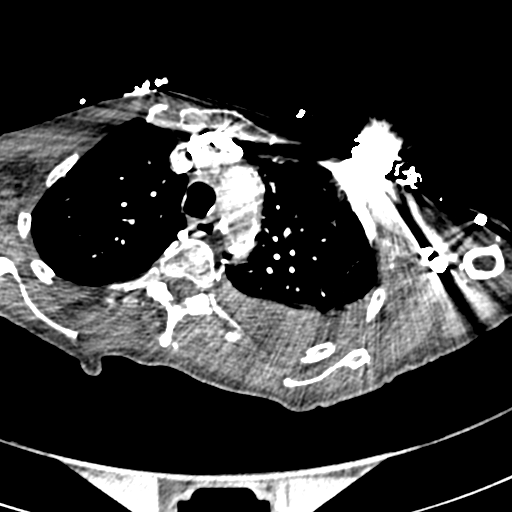
[im 256/320  lung]
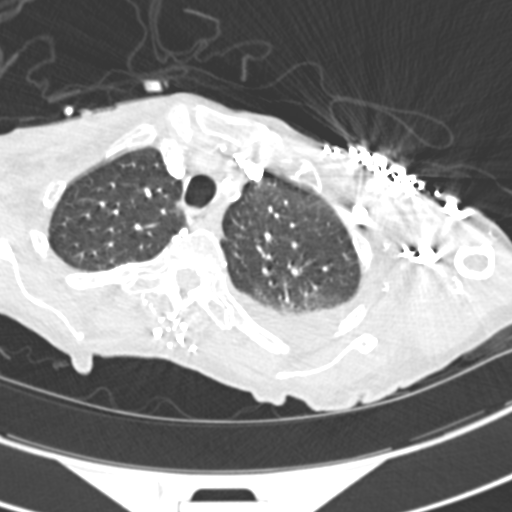
[im 272/320  mediastinal]
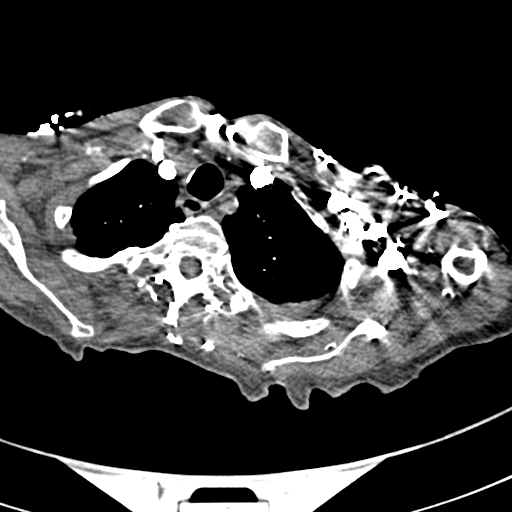
[im 288/320  lung]
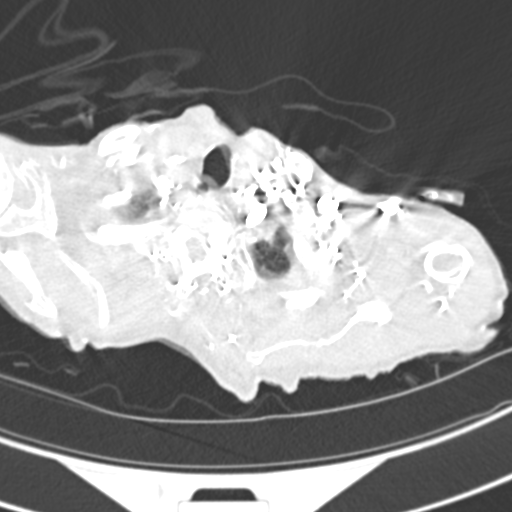
[im 304/320  mediastinal]
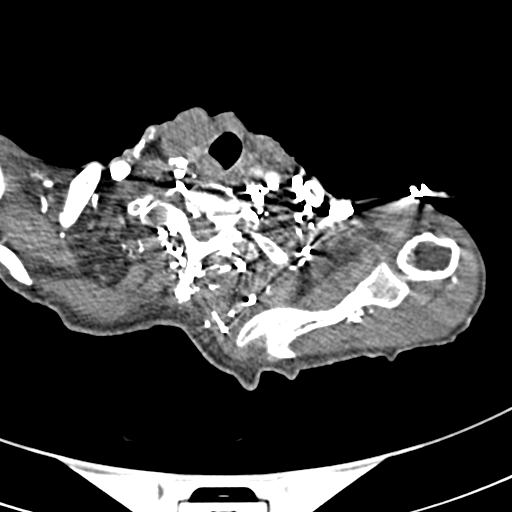

[Series 8: coronal mpr · coronal · 0.59mm/px · 1 of 93 slices shown]
[im 47/93  mediastinal]
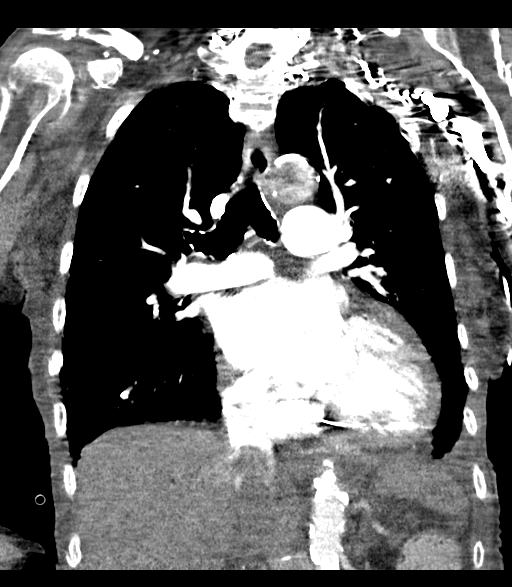

[19 of 36 positions shown; findings below may reference images not displayed]

FINDINGS: Cardiovascular: Contrast injection is sufficient to demonstrate
satisfactory opacification of the pulmonary arteries to the
segmental level. There is no pulmonary embolus. The main pulmonary
artery is within normal limits for size. There is no CT evidence of
acute right heart strain. There is calcific aortic atherosclerosis.
Heart size is enlarged. No pericardial effusion.

Mediastinum/Nodes: No mediastinal, hilar or axillary
lymphadenopathy. The visualized thyroid and thoracic esophageal
course are unremarkable.

Lungs/Pleura: Medium left pleural effusion. Left basilar
atelectasis. Dependent subsegmental atelectasis in the right lung.
No large consolidation.

Upper Abdomen: Contrast bolus timing is not optimized for evaluation
of the abdominal organs. Within this limitation, the visualized
organs of the upper abdomen are normal.

Musculoskeletal: No chest wall abnormality. No acute or significant
osseous findings.

Review of the MIP images confirms the above findings.
IMPRESSION: 1. No pulmonary embolus.
2. Medium-sized left pleural effusion with associated atelectasis.
3. Cardiomegaly and Aortic Atherosclerosis ([id number]).
